# Patient Record
Sex: Female | Born: 1945 | Race: White | Hispanic: No | Smoking: Former smoker
Health system: Southern US, Community
[De-identification: ages and names within clinical notes are randomized; demographics above are authoritative.]

## PROBLEM LIST (undated history)

## (undated) DIAGNOSIS — H61009 Unspecified perichondritis of external ear, unspecified ear: Secondary | ICD-10-CM

## (undated) DIAGNOSIS — S7290XA Unspecified fracture of unspecified femur, initial encounter for closed fracture: Secondary | ICD-10-CM

## (undated) DIAGNOSIS — M8580 Other specified disorders of bone density and structure, unspecified site: Secondary | ICD-10-CM

## (undated) DIAGNOSIS — M1990 Unspecified osteoarthritis, unspecified site: Secondary | ICD-10-CM

## (undated) DIAGNOSIS — T8859XA Other complications of anesthesia, initial encounter: Secondary | ICD-10-CM

## (undated) DIAGNOSIS — K219 Gastro-esophageal reflux disease without esophagitis: Secondary | ICD-10-CM

## (undated) DIAGNOSIS — M25559 Pain in unspecified hip: Secondary | ICD-10-CM

## (undated) DIAGNOSIS — T4145XA Adverse effect of unspecified anesthetic, initial encounter: Secondary | ICD-10-CM

## (undated) DIAGNOSIS — D649 Anemia, unspecified: Secondary | ICD-10-CM

## (undated) DIAGNOSIS — M810 Age-related osteoporosis without current pathological fracture: Secondary | ICD-10-CM

## (undated) DIAGNOSIS — G4700 Insomnia, unspecified: Secondary | ICD-10-CM

## (undated) DIAGNOSIS — E079 Disorder of thyroid, unspecified: Secondary | ICD-10-CM

## (undated) HISTORY — PX: FOOT SURGERY: SHX648

## (undated) HISTORY — DX: Pain in unspecified hip: M25.559

## (undated) HISTORY — PX: ANKLE SURGERY: SHX546

## (undated) HISTORY — PX: HIP SURGERY: SHX245

## (undated) HISTORY — PX: TOTAL HIP ARTHROPLASTY: SHX124

## (undated) HISTORY — DX: Insomnia, unspecified: G47.00

## (undated) HISTORY — DX: Unspecified perichondritis of external ear, unspecified ear: H61.009

## (undated) HISTORY — PX: KNEE SURGERY: SHX244

## (undated) HISTORY — PX: LUMBAR SPINE SURGERY: SHX701

## (undated) HISTORY — PX: FRACTURE SURGERY: SHX138

## (undated) HISTORY — PX: LEG SURGERY: SHX1003

## (undated) HISTORY — DX: Unspecified fracture of unspecified femur, initial encounter for closed fracture: S72.90XA

## (undated) HISTORY — DX: Disorder of thyroid, unspecified: E07.9

---

## 2013-08-25 DIAGNOSIS — Z9282 Status post administration of tPA (rtPA) in a different facility within the last 24 hours prior to admission to current facility: Secondary | ICD-10-CM | POA: Insufficient documentation

## 2013-08-25 DIAGNOSIS — M810 Age-related osteoporosis without current pathological fracture: Secondary | ICD-10-CM | POA: Insufficient documentation

## 2013-08-25 DIAGNOSIS — Z79891 Long term (current) use of opiate analgesic: Secondary | ICD-10-CM | POA: Insufficient documentation

## 2014-06-10 DIAGNOSIS — Z79899 Other long term (current) drug therapy: Secondary | ICD-10-CM | POA: Insufficient documentation

## 2014-06-10 DIAGNOSIS — Z87891 Personal history of nicotine dependence: Secondary | ICD-10-CM | POA: Insufficient documentation

## 2014-06-10 DIAGNOSIS — Y9389 Activity, other specified: Secondary | ICD-10-CM | POA: Insufficient documentation

## 2014-06-10 DIAGNOSIS — M810 Age-related osteoporosis without current pathological fracture: Secondary | ICD-10-CM | POA: Diagnosis not present

## 2014-06-10 DIAGNOSIS — Z7982 Long term (current) use of aspirin: Secondary | ICD-10-CM | POA: Diagnosis not present

## 2014-06-10 DIAGNOSIS — IMO0002 Reserved for concepts with insufficient information to code with codable children: Secondary | ICD-10-CM | POA: Diagnosis not present

## 2014-06-10 DIAGNOSIS — Y9289 Other specified places as the place of occurrence of the external cause: Secondary | ICD-10-CM | POA: Diagnosis not present

## 2014-06-10 DIAGNOSIS — S46909A Unspecified injury of unspecified muscle, fascia and tendon at shoulder and upper arm level, unspecified arm, initial encounter: Secondary | ICD-10-CM | POA: Diagnosis present

## 2014-06-10 DIAGNOSIS — W07XXXA Fall from chair, initial encounter: Secondary | ICD-10-CM | POA: Insufficient documentation

## 2014-06-10 DIAGNOSIS — S42209A Unspecified fracture of upper end of unspecified humerus, initial encounter for closed fracture: Secondary | ICD-10-CM | POA: Diagnosis not present

## 2014-06-10 DIAGNOSIS — S4980XA Other specified injuries of shoulder and upper arm, unspecified arm, initial encounter: Secondary | ICD-10-CM | POA: Diagnosis present

## 2014-06-10 NOTE — ED Notes (Signed)
Per EMS: Pt was getting up from chair and fell, caught herself with L arm. Deformity and swelling noted to L shoulder, as well as bruising. Pt denies being on blood thinners. Good distal pulse. Pt given 200 mcg Fentanyl and 4 mg Zofran en route.

## 2014-06-11 ENCOUNTER — Emergency Department (HOSPITAL_COMMUNITY): Payer: Medicare Other

## 2014-06-11 ENCOUNTER — Emergency Department (HOSPITAL_COMMUNITY)
Admission: EM | Admit: 2014-06-11 | Discharge: 2014-06-11 | Disposition: A | Discharge status: Home / Self Care | Payer: Medicare Other | Attending: Emergency Medicine | Admitting: Emergency Medicine

## 2014-06-11 ENCOUNTER — Encounter (HOSPITAL_COMMUNITY): Payer: Self-pay | Admitting: Emergency Medicine

## 2014-06-11 DIAGNOSIS — S42302A Unspecified fracture of shaft of humerus, left arm, initial encounter for closed fracture: Secondary | ICD-10-CM

## 2014-06-11 HISTORY — DX: Age-related osteoporosis without current pathological fracture: M81.0

## 2014-06-11 HISTORY — DX: Other specified disorders of bone density and structure, unspecified site: M85.80

## 2014-06-11 MED ORDER — HYDROCODONE-ACETAMINOPHEN 5-325 MG PO TABS
1.0000 | ORAL_TABLET | Freq: Once | ORAL | Status: AC
  Administered 2014-06-11: 1 via ORAL
  Filled 2014-06-11: qty 1

## 2014-06-11 NOTE — ED Provider Notes (Signed)
CSN: [id number]     Arrival date & time 06/10/14  2358 History   First MD Initiated Contact with Patient 06/11/14 0004     Chief Complaint  Patient presents with  . Fall   HPI Patient with PMH of multiple fractures and dislocations of multiple joints and chronic pain and osteopenia presents after a fall at 8pm. She fell directly on anterior left shoulder which immediately became edematous. She has severe pain in left shoulder but denies pain anywhere else. She denies loss of consciousness or hitting her head. She says she lost her balance which happens often for her. She says she did get lightheaded which happens often for her. Also she has leg length discrepancy which she attributed to her fall as well. Patient denies CP, shortness of breath, palpitations, peripheral edema, N/V/D, numbness, tingling. She endorses chronic weakness in her legs bilaterally. Patient takes regularly takes norco 6 times a day for chronic pain and is seen at [facility]. She was given 200mcg Fentanyl and 4mg  zofran by EMS.  Past Medical History  Diagnosis Date  . Osteopenia   . Osteoporosis    Past Surgical History  Procedure Laterality Date  . Total hip arthroplasty    . Knee surgery    . Foot surgery     No family history on file. History  Substance Use Topics  . Smoking status: Former Games developermoker  . Smokeless tobacco: Not on file  . Alcohol Use: 4.8 oz/week    8 Glasses of wine per week   OB History   Grav Para Term Preterm Abortions TAB SAB Ect Mult Living                 Review of Systems  Constitutional: Negative for fever and chills.  HENT: Negative for congestion and rhinorrhea.   Respiratory: Negative for cough and shortness of breath.   Cardiovascular: Negative for chest pain, palpitations and leg swelling.  Gastrointestinal: Negative for nausea, vomiting and diarrhea.  Musculoskeletal: Negative for back pain and neck pain.  Neurological: Positive for weakness. Negative for syncope and  numbness.  Psychiatric/Behavioral: Negative for confusion.      Allergies  Butrans  Home Medications   Prior to Admission medications   Medication Sig Start Date End Date Taking? Authorizing Provider  aspirin EC 81 MG tablet Take 81 mg by mouth daily.   Yes Historical Provider, MD  cetirizine (ZYRTEC) 10 MG tablet Take 10 mg by mouth daily.   Yes Historical Provider, MD  fluticasone (FLONASE) 50 MCG/ACT nasal spray Place 2 sprays into both nostrils daily.   Yes Historical Provider, MD  HYDROcodone-acetaminophen (NORCO/VICODIN) 5-325 MG per tablet Take 2 tablets by mouth every 4 (four) hours as needed for moderate pain.   Yes Historical Provider, MD  raloxifene (EVISTA) 60 MG tablet Take 60 mg by mouth daily.   Yes Historical Provider, MD  terbinafine (LAMISIL) 250 MG tablet Take 250 mg by mouth daily.   Yes Historical Provider, MD  traZODone (DESYREL) 50 MG tablet Take 100 mg by mouth at bedtime.   Yes Historical Provider, MD   BP 97/48  Pulse 66  Temp(Src) 97.9 F (36.6 C) (Oral)  Resp 19  SpO2 98% Physical Exam  Vitals reviewed. Constitutional: She is oriented to person, place, and time. No distress.  HENT:  Head: Normocephalic and atraumatic.  Eyes: Conjunctivae and EOM are normal.  Neck: Normal range of motion. Neck supple.  Cardiovascular: Normal rate, regular rhythm and normal heart sounds.  Pulmonary/Chest: Effort normal and breath sounds normal. No respiratory distress. She has no wheezes.  Abdominal: Soft. Bowel sounds are normal. She exhibits no distension. There is no tenderness.  Musculoskeletal:       Left shoulder: She exhibits decreased range of motion, tenderness, deformity and pain. She exhibits normal pulse.  Left shoulder: 5cm hematoma with overlying 3-4cm noncontaminated abrasion and ecchymosis.  Sensation and ROM of L elbow intact with strength limited due to pain. Neurovascularly intact.  Neurological: She is alert and oriented to person, place, and  time. She exhibits normal muscle tone.  Skin: Skin is warm and dry. She is not diaphoretic.  Psychiatric: She has a normal mood and affect. Her behavior is normal.    ED Course  Procedures (including critical care time) Labs Review Labs Reviewed - No data to display  Imaging Review Dg Shoulder Left  06/11/2014   CLINICAL DATA:  Patient fell off a chair onto her left shoulder with pain and bruising  EXAM: LEFT SHOULDER - 2+ VIEW  COMPARISON:  None.  FINDINGS: There is a fracture of the surgical neck of the humerus, with moderate medial displacement of the distal fracture fragment and mild apex medial angulation.  IMPRESSION: Acute fracture of the surgical neck of the humerus   Electronically Signed   By: Esperanza Heir M.D.   On: 06/11/2014 01:27     EKG Interpretation   Date/Time:  Saturday June 11 2014 00:24:35 EDT Ventricular Rate:  65 PR Interval:  131 QRS Duration: 95 QT Interval:  471 QTC Calculation: 490 R Axis:   80 Text Interpretation:  Normal sinus rhythm Normal ECG No old tracing to  compare Confirmed by MILLER  MD, BRIAN ([id number]) on 06/11/2014 12:27:13 AM      MDM   Final diagnoses:  Left humeral fracture, closed, initial encounter   Patient with PMH of multiple fractures and dislocations of knee, shoulder and hip presents after mechanical fall with left shoulder hematoma and deformity. Xray shows left humeral fracture without dislocation. Patient given shoulder immobilizer and advised to follow up with Dr. Magnus Ivan, orthopedist. RICE: rest, ice, compression and elevation. Patient advised on the signs and symptoms of infection and verbalized understanding. Patient given pain meds in the ED but not an outpatient script. Patient advised to follow up with pain clinic for outpatient medical management .  Discussed return precautions with patient. Discussed all results and patient verbalizes understanding and agrees with plan.  This is a shared patient. This patient was  discussed with the physician who saw and evaluated the patient.    Louann Sjogren, PA-C 06/11/14 [phone]

## 2014-06-11 NOTE — ED Notes (Signed)
Bed: WA17 Expected date: 06/10/14 Expected time: 11:43 PM Means of arrival: Ambulance Comments: Bed 17, EMS, 3766 F, Fall

## 2014-06-11 NOTE — ED Provider Notes (Signed)
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 She presents after losing her balance this evening when trying to stand, fell over onto her left shoulder onto a carpeted surface causing acute onset of pain at the shoulder. Her medics were called to the scene and found the patient with a deformity and swelling of the left shoulder, they placed a splint in the field in the form of a sling, give 200 mcg of fentanyl with some improvement. The patient states is not unusual for her to get lightheaded or to lose her balance when she stands. She does use assistance when ambulating including scooters and walking devices  Medical screening examination/treatment/procedure(s) were conducted as a shared visit with non-physician practitioner(s) and myself.  I personally evaluated the patient during the encounter.  Clinical Impression: fracture of proximal humerus      Vida RollerBrian D Lavern Maslow, MD 06/11/14 [phone]

## 2014-06-11 NOTE — Discharge Instructions (Signed)
Return to the emergency room with worsening of symptoms or with symptoms that are concerning. Follow up with orthopedics, either the surgeon who has previously operated on you or the one in [facility]. Continue with home pain meds. Do not operate machinery or drive while taking narcotics. Contact your pain clinic provider for increases in pain meds. RICE: Rest, ICE, Compression, Elevation

## 2014-06-11 NOTE — ED Provider Notes (Signed)
Medical screening examination/treatment/procedure(s) were conducted as a shared visit with non-physician practitioner(s) and myself.  I personally evaluated the patient during the encounter  Please see my separate respective documentation pertaining to this patient encounter   Vida RollerBrian D Joziah Dollins, MD 06/11/14 [phone]

## 2014-06-11 NOTE — ED Notes (Signed)
MD at bedside. 

## 2014-09-03 LAB — CBC
HCT: 34.4 % — ABNORMAL LOW (ref 35.0–47.0)
HGB: 11.2 g/dL — ABNORMAL LOW (ref 12.0–16.0)
MCH: 33.3 pg (ref 26.0–34.0)
MCHC: 32.7 g/dL (ref 32.0–36.0)
MCV: 102 fL — ABNORMAL HIGH (ref 80–100)
PLATELETS: 306 10*3/uL (ref 150–440)
RBC: 3.37 10*6/uL — ABNORMAL LOW (ref 3.80–5.20)
RDW: 13 % (ref 11.5–14.5)
WBC: 13.9 10*3/uL — AB (ref 3.6–11.0)

## 2014-09-03 LAB — COMPREHENSIVE METABOLIC PANEL
ALBUMIN: 3.7 g/dL (ref 3.4–5.0)
AST: 29 U/L (ref 15–37)
Alkaline Phosphatase: 77 U/L
Anion Gap: 13 (ref 7–16)
BILIRUBIN TOTAL: 0.2 mg/dL (ref 0.2–1.0)
BUN: 6 mg/dL — AB (ref 7–18)
CO2: 23 mmol/L (ref 21–32)
CREATININE: 0.46 mg/dL — AB (ref 0.60–1.30)
Calcium, Total: 7.9 mg/dL — ABNORMAL LOW (ref 8.5–10.1)
Chloride: 93 mmol/L — ABNORMAL LOW (ref 98–107)
EGFR (Non-African Amer.): >60
GLUCOSE: 90 mg/dL (ref 65–99)
OSMOLALITY: 256 (ref 275–301)
Potassium: 3.8 mmol/L (ref 3.5–5.1)
SGPT (ALT): 19 U/L
Sodium: 129 mmol/L — ABNORMAL LOW (ref 136–145)
TOTAL PROTEIN: 6.7 g/dL (ref 6.4–8.2)

## 2014-09-03 LAB — TROPONIN I: Troponin-I: <0.02 ng/mL

## 2014-09-04 ENCOUNTER — Inpatient Hospital Stay: Payer: Self-pay | Admitting: Internal Medicine

## 2014-09-04 LAB — URINALYSIS, COMPLETE
BLOOD: NEGATIVE
Bilirubin,UR: NEGATIVE
Glucose,UR: NEGATIVE mg/dL (ref 0–75)
Leukocyte Esterase: NEGATIVE
Nitrite: NEGATIVE
PH: 6 (ref 4.5–8.0)
Protein: NEGATIVE
RBC,UR: NONE SEEN /HPF (ref 0–5)
Specific Gravity: 1.009 (ref 1.003–1.030)

## 2014-09-04 LAB — PROTIME-INR
INR: 0.9
Prothrombin Time: 12.5 secs (ref 11.5–14.7)

## 2014-09-04 LAB — APTT: Activated PTT: 29.6 secs (ref 23.6–35.9)

## 2014-09-05 LAB — CBC WITH DIFFERENTIAL/PLATELET
BASOS ABS: 0 10*3/uL (ref 0.0–0.1)
Basophil #: 0 10*3/uL (ref 0.0–0.1)
Basophil %: 0.2 %
Basophil %: 0.4 %
EOS PCT: 0 %
Eosinophil #: 0 10*3/uL (ref 0.0–0.7)
Eosinophil #: 0 10*3/uL (ref 0.0–0.7)
Eosinophil %: 0.3 %
HCT: 20.8 % — ABNORMAL LOW (ref 35.0–47.0)
HCT: 23.7 % — AB (ref 35.0–47.0)
HGB: 7 g/dL — ABNORMAL LOW (ref 12.0–16.0)
HGB: 8 g/dL — AB (ref 12.0–16.0)
Lymphocyte #: 0.5 10*3/uL — ABNORMAL LOW (ref 1.0–3.6)
Lymphocyte #: 1 10*3/uL (ref 1.0–3.6)
Lymphocyte %: 7.7 %
Lymphocyte %: 12.3 %
MCH: 34.3 pg — ABNORMAL HIGH (ref 26.0–34.0)
MCH: 32.2 pg (ref 26.0–34.0)
MCHC: 33.6 g/dL (ref 32.0–36.0)
MCHC: 33.9 g/dL (ref 32.0–36.0)
MCV: 102 fL — ABNORMAL HIGH (ref 80–100)
MCV: 95 fL (ref 80–100)
MONO ABS: 1 x10 3/mm — AB (ref 0.2–0.9)
Monocyte #: 1 x10 3/mm — ABNORMAL HIGH (ref 0.2–0.9)
Monocyte %: 15.8 %
Monocyte %: 12.9 %
NEUTROS ABS: 6 10*3/uL (ref 1.4–6.5)
NEUTROS PCT: 76.3 %
Neutrophil #: 5 10*3/uL (ref 1.4–6.5)
Neutrophil %: 74.1 %
Platelet: 215 10*3/uL (ref 150–440)
Platelet: 184 10*3/uL (ref 150–440)
RBC: 2.04 10*6/uL — AB (ref 3.80–5.20)
RBC: 2.5 10*6/uL — ABNORMAL LOW (ref 3.80–5.20)
RDW: 12.8 % (ref 11.5–14.5)
RDW: 17 % — AB (ref 11.5–14.5)
WBC: 6.5 10*3/uL (ref 3.6–11.0)
WBC: 8.1 10*3/uL (ref 3.6–11.0)

## 2014-09-05 LAB — BASIC METABOLIC PANEL
Anion Gap: 7 (ref 7–16)
BUN: 6 mg/dL — AB (ref 7–18)
CALCIUM: 7.9 mg/dL — AB (ref 8.5–10.1)
Chloride: 99 mmol/L (ref 98–107)
Co2: 25 mmol/L (ref 21–32)
Creatinine: 0.47 mg/dL — ABNORMAL LOW (ref 0.60–1.30)
EGFR (Non-African Amer.): >60
Glucose: 120 mg/dL — ABNORMAL HIGH (ref 65–99)
OSMOLALITY: 261 (ref 275–301)
Potassium: 4.6 mmol/L (ref 3.5–5.1)
Sodium: 131 mmol/L — ABNORMAL LOW (ref 136–145)

## 2014-09-06 LAB — HEMOGLOBIN: HGB: 8 g/dL — ABNORMAL LOW (ref 12.0–16.0)

## 2014-09-07 LAB — CBC WITH DIFFERENTIAL/PLATELET
BASOS ABS: 0 10*3/uL (ref 0.0–0.1)
BASOS PCT: 0.3 %
Eosinophil #: 0 10*3/uL (ref 0.0–0.7)
Eosinophil %: 0.5 %
HCT: 23.8 % — ABNORMAL LOW (ref 35.0–47.0)
HGB: 7.9 g/dL — ABNORMAL LOW (ref 12.0–16.0)
LYMPHS PCT: 11.3 %
Lymphocyte #: 1 10*3/uL (ref 1.0–3.6)
MCH: 32.2 pg (ref 26.0–34.0)
MCHC: 33.1 g/dL (ref 32.0–36.0)
MCV: 97 fL (ref 80–100)
Monocyte #: 0.9 x10 3/mm (ref 0.2–0.9)
Monocyte %: 9.8 %
NEUTROS PCT: 78.1 %
Neutrophil #: 6.8 10*3/uL — ABNORMAL HIGH (ref 1.4–6.5)
PLATELETS: 236 10*3/uL (ref 150–440)
RBC: 2.44 10*6/uL — ABNORMAL LOW (ref 3.80–5.20)
RDW: 16.8 % — ABNORMAL HIGH (ref 11.5–14.5)
WBC: 8.7 10*3/uL (ref 3.6–11.0)

## 2015-02-25 NOTE — Consult Note (Signed)
Brief Consult Note: Diagnosis: Left intertrochanteric hip fracture with acute vs. chronic left proximal humerus fracture status post fall.   Patient was seen by consultant.   Consult note dictated.   Recommend to proceed with surgery or procedure.   Orders entered.   Comments: Patient has a displaced left intertrochanteric hip fracture for which I have recommended intramedullary fixation tomorrow.  She will be admitted to hospitalist service and will require pre-op clearance.  Orders entered.  Patient placed on OR schedule for tomorrow.  NPO after midnight.  No anticoagulants overnight.  Electronic Signatures: Juanell FairlyKrasinski, Ionia Schey (MD)  (Signed [phone]-Nov-15 00:20)  Authored: Brief Consult Note   Last Updated: 01-Nov-15 00:20 by Juanell FairlyKrasinski, Bralon Antkowiak (MD)

## 2015-02-25 NOTE — Discharge Summary (Signed)
PATIENT NAME:  Nicole Butler, Nicole Butler MR#:  [id number] DATE OF BIRTH:  04-Aug-1946  DATE OF ADMISSION:  09/04/2014 DATE OF DISCHARGE:  09/07/2014  DISCHARGE DIAGNOSES:  1.  Left intertrochanteric femur fracture.  2.  Chronic humerus fracture.  3.  Chronic pain syndrome.  4.  Constipation.  5.  Noncompliance.   DISCHARGE MEDICATIONS:  1.  Evista 60 mg oral once a day.  2.  Terbinafin 250 mg oral once a day.  3.  Tizanidine 2 mg oral every 6 hours as needed.  4.  Trazodone 100 mg oral once a day.  5.  Acetaminophen hydrocodone 325/5 at 1 tablet oral every 6 hours as needed.  6.  Ferrous sulfate 325 mg oral 2 times a day.  7.  Docusate sodium diastolic murmur 240 mg oral once a day.  8.  Enoxaparin 30 mg subcutaneous once a day for 4 weeks.  9.  Calcium vitamin D 1 tablet 2 times a day.  10. Fentanyl 25 mcg topical every 3 days.   DISCHARGE INSTRUCTIONS: Physical therapy at home has been set up. Home health. Regular diet. Activity per Dr. Martha ClanKrasinski.  Follow up with Dr. Martha ClanKrasinski in 1 to 2 weeks.   IMAGING STUDIES DONE: Include a left hip x-ray showed left hip fracture.   ADMITTING HISTORY AND PHYSICAL:  Please see detailed H and P dictated previously. In brief, a 69 year old female patient with chronic pain syndrome, presented to the hospital with left hip pain after having a fall which was mechanical. The patient was found to have a left hip fracture, admitted to hospitalist service. Left intertrochanteric femur fracture. The patient had surgery with Dr. Martha ClanKrasinski and tolerated it well. She did have some acute blood loss anemia after surgery for which she had 2 units of packed RBC transfused. Hemoglobin was stable.  PT saw the patient.  The patient was recommended that she be discharged to a skilled nursing facility for further physical therapy, but the patient has refused and wanted to go home and is being discharged home with home health at the patient's request. The patient has been given fentanyl  patches for acute pain along with her prior home medications being refilled.   The patient has been noncompliant with physical therapy, also taking her laxatives. She does have chronic constipation, but no abdominal pain, nausea, vomiting, and had a bowel movement in the hospital.   Prior to discharge, the patient is awake, alert. Lungs sound clear. S1, S2 heard. Dressing over the scar with no discharge or swelling.   TIME SPENT ON DAY OF DISCHARGE IN DISCHARGE ACTIVITY:  35 minutes.     ____________________________ Molinda BailiffSrikar R. Amay Mijangos, MD srs:DT D: 09/08/2014 14:14:27 ET T: 09/08/2014 14:44:13 ET JOB#: [id number]  cc: Wardell HeathSrikar R. Braylin Formby, MD, <Dictator> Orie FishermanSRIKAR R Hani Campusano MD ELECTRONICALLY SIGNED 09/28/2014 8:55

## 2015-02-25 NOTE — Op Note (Signed)
PATIENT NAME:  Nicole NicolasSPEAR, Irasema MR#:  [id number] DATE OF BIRTH:  Sep 09, 1946  DATE OF PROCEDURE:  09/04/2014  PREOPERATIVE DIAGNOSIS: Left comminuted, displaced intertrochanteric hip fracture.   POSTOPERATIVE DIAGNOSIS: Left comminuted, displaced intertrochanteric hip fracture.   PROCEDURE: Intramedullary fixation of left intertrochanteric hip fracture.   SURGEON: Kathreen DevoidKevin L. Ronte Parker, MD   ANESTHESIA: Spinal with MAC.   ESTIMATED BLOOD LOSS: 100 mL.   COMPLICATIONS: None.   IMPLANTS: Biomet Affixus 9 mm short nail with 100 mm lag screw and 36 mm distal interlocking screw.   INDICATIONS FOR PROCEDURE: The patient is a 69 year old female who sustained a fall prior to arrival at [facility]. She had a previous proximal humerus fracture on the left side sustained approximately 3 weeks prior. The patient was diagnosed with a comminuted displaced left intertrochanteric hip fracture. Given the patient's ambulatory status at baseline, I  recommended intramedullary fixation for this fracture. I reviewed the risks and benefits of surgery with the patient. She understands the risks include, but are not limited to, infection, bleeding, nerve or blood vessel injury, persistent hip pain, leg length discrepancy or change in lower extremity rotation, and the need for further surgery, femoral head AVN, and failure of the hardware. Medical risks include, but are not limited to, DVT and pulmonary embolism, myocardial infarction, stroke, pneumonia, respiratory failure, and death. The patient understood these risks and wished to proceed.   PROCEDURE NOTE: The patient was brought into the operating room after her left hip was marked according to the hospital's right site protocol. She underwent a spinal anesthetic by the anesthesia service. She was then placed supine on the fracture table. Her left leg was placed in a leg holder and the right leg was placed in a well leg holder in a hemi-lithotomy position. All bony prominences  were adequately padded. The patient was prepped and draped in a sterile fashion. A timeout was performed to verify the patient's name, date of birth, medical record number, correct site of surgery, and correct procedure to be performed. It was also used to verify the patient received antibiotics and that all appropriate instruments, implants, and radiographic studies were available in the room. Once all in attendance were in agreement, the case began.   The patient had traction and slight external rotation applied to align the fracture in the near anatomic position. An incision was made then superior to the tip of the greater trochanter. A threaded guide pin was then inserted into the tip of the greater trochanter and advanced into the proximal femur to the level of the lesser trochanter. This was overdrilled with a 15 mm entry drill. The patient then had a Biomet Affixus, 9 mm short nail, inserted into the tip of the greater trochanter and advanced across the fracture and into the proximal femoral shaft. Its position was confirmed on AP and lateral C-arm images. A drill sleeve was then placed through the Biomet Affixus guide arm. A stab incision was made over the lateral hip near the greater trochanter. The guide sleeve was then brought in contact with the lateral cortex of the femur. A threaded drill pin was then advanced through the lateral cortex across the fracture site and into the femoral head. Its position was confirmed on AP and lateral C-arm images. The depth was measured at 100 mm. This was then overdrilled with a lag screw and drill. A 100 mm lag screw was then advanced into position by hand. Again, its position was confirmed on AP and lateral C-arm  images. The set screw was then tightened and then loosened a quarter turn to allow for compression at the fracture site. The attention was then turned to the distal interlocking screw.   Through the guide arm again, a drill sleeve was placed. Another stab  incision distal to the lag screw incision was made. The interlocking drill guide was then advanced into position along the lateral cortex of the femur. The drill guide for the distal interlocking screw was then advanced bicortically. A depth gauge was used to measure the diameter of the cortex at this level. It was found to be 36 mm in length. The distal interlocking screw was then advanced into position by hand. The Affixus guide arm was then removed. Final C-arm images of the intramedullary construct was performed. All incisions were then copiously irrigated. Through the proximal incision, the fascia lata was closed with an interrupted 0 Vicryl. All 3 incisions had the subcutaneous tissue closed with 2-0 Vicryl and the skin approximated with staples. A dry sterile dressing was applied. The patient was then transferred to a hospital bed and brought to the PACU in stable condition. I was scrubbed and present for the entire case, and all sharp and instrument counts were correct at the conclusion of the case.   I spoke with the patient's family postoperatively to let them know the case had gone without complication and the patient was stable in the recovery room.    ____________________________ Kathreen Devoid, MD klk:LT D: 09/12/2014 19:21:20 ET T: 09/12/2014 19:43:43 ET JOB#: [id number]  cc: Kathreen Devoid, MD, <Dictator> Kathreen Devoid MD ELECTRONICALLY SIGNED 09/21/2014 8:06

## 2015-02-25 NOTE — H&P (Signed)
PATIENT NAME:  Nicole Butler, SERGENT MR#:  [id number] DATE OF BIRTH:  04/30/1946  DATE OF ADMISSION:  09/04/2014  REFERRING DOCTOR: Dorothea Glassman, MD  PRIMARY CARE DOCTOR: Phillips Odor. Jamesetta Orleans, NP  ORTHOPEDIC SURGEON: Kathreen Devoid, MD  ADMITTING DOCTOR: Crissie Figures, MD  CHIEF COMPLAINT: Status post fall with left hip intertrochanteric fracture.  HISTORY OF PRESENT ILLNESS: A 69 year old Caucasian female with a past medical history of osteopenia, history of mild depression, presents to the Emergency Room with the complaint of status post fall with injury to left hip and left shoulder. The patient stated that at about 6:00 p.m. last night she was trying to just move around, following which she tripped and fell down on the left hip and left shoulder, following which she developed pain and she was not able to get up off the floor, and hence called for help and was brought to the Emergency Room for further evaluation. The patient denies any chest pain, shortness of breath, dizziness, loss of consciousness prior to the episode. She does have a history of frequent falls secondary due to her chronic orthopedic problems involving the right hip for which she has been under care of [facility] orthopedic surgeon. In the Emergency Room, the patient was evaluated by the ED physician and found to be with stable vitals, and x-rays revealed a left hip intertrochanteric fracture and also a left proximal humerus fracture, which is a kind of acute versus subacute. Orthopedic service was consulted by the ED physician and evaluated by the orthopedic surgeon, and the patient is scheduled for surgery for the left hip fracture in the morning. The patient got pain medications in the Emergency Room and currently her pain is under control. As mentioned earlier, the patient had a history of frequent falls and she did have multiple orthopedic surgeries involving the right hip including the right hip replacement and also in other bones,  following which her gait is unsteady and she uses a scooter at home, but she is ambulatory at home. No history of any hypertension, diabetes, heart problems, lung problems, kidney problems.   PAST MEDICAL HISTORY:  1.  Osteopenia.  2.  Mild depression.   PAST SURGICAL HISTORY: 1.  Multiple surgeries to right hip x 4 following injury and fractures.  2.  Status post right hip replacement.  3.  Multiple orthopedic surgeries involving other bones secondary due to fall and injuries.   HOME MEDICATIONS: 1.  Acetaminophen and hydrocodone 325/5 mg tablet 1 to 2 tablets every 6 hours as needed for pain.  2.  Evista 60 mg tablet 1 tablet orally once a day.  3.  Tizanidine 2 mg tablet 1 tablet every 6 hours as needed for spasms.  4.  Trazodone 100 mg tablet 1 tablet at bedtime for insomnia.  ALLERGIES: No known drug allergies.   SOCIAL HISTORY: She is single, lives alone at her home. She is a retired Media planner. A history of smoking in the remote past, quit many years ago. She does take infrequent alcohol, about 2 glasses of wine on and off. Denies any substance use.   FAMILY HISTORY: Nil significant.   REVIEW OF SYSTEMS:  CONSTITUTIONAL: Negative for fever, fatigue, generalized weakness, abdominal weight gain or weight loss.  EYES: Negative for blurred vision or double vision. No pain. No redness. No inflammation.  EARS, NOSE, AND THROAT: Negative for tinnitus, ear pain, hearing loss, epistaxis, nasal discharge, or difficulty swallowing.  RESPIRATORY: Negative for cough, wheezing, hemoptysis, dyspnea, or painful respirations.  CARDIOVASCULAR: Negative for chest pain, orthopnea, dyspnea on exertion, palpitations, syncope, or dizziness. No pedal edema.  GASTROINTESTINAL: Negative for nausea, vomiting, diarrhea, abdominal pain, hematemesis, melena, GERD symptoms, or rectal bleeding.  GENITOURINARY: Negative for dysuria, hematuria, frequency, or urgency.  ENDOCRINE: Negative for polyuria,  polydipsia. No heat or cold intolerance.  HEMATOLOGIC: Negative for anemia or easy bruising or bleeding.  INTEGUMENTARY: Negative for skin rash, acne, or skin lesions.  MUSCULOSKELETAL: History of multiple orthopedic surgeries on the right hip as well as other bones in the body secondary due to falls with fractures, and she does have chronic aches and pains for which she takes pain medication, hence she does have chronic spasms for which she takes pain medication. She is under care of orthopedics at [facility].  NEUROLOGICAL: Negative for focal weakness, numbness. No CVA, TIA, seizure disorder.  PSYCHIATRIC: She does have a history of mild depression and chronic insomnia, for which she takes medication and is currently under control.   PHYSICAL EXAMINATION:  VITAL SIGNS: Temperature 98.1 degrees Fahrenheit, pulse rate 75 per minute and regular, respirations 21 per minute, blood pressure 123/101, oxygen saturation 99% on room air.  GENERAL: Well-developed, well-nourished, alert, awake, and oriented, in mild distress. States pain under control with pain medications.  HEAD: Atraumatic, normocephalic.  EYES: Pupils equal and react to light. No conjunctival pallor. No scleral icterus. Extraocular movements intact.  NOSE: No nasal lesions. No drainage.  EARS: No drainage. No external lesions.  ORAL CAVITY: No mucosal lesions. No exudates. No masses.  NECK: Supple. No JVD. No thyromegaly. No carotid bruits. Range of motion normal. RESPIRATORY: Good respiratory effort. Not using any accessory muscles of respiration. Bilateral vesicular breath sounds. No rales or rhonchi.  HEART: S1, S2 regular. No murmurs, gallops, or clicks appreciated. No pedal edema. Peripheral pulses equal at carotid, femoral, and pedal pulses.  GASTROINTESTINAL: Abdomen soft, nontender. Liver and spleen not palpable. Bowel sounds present and equal in all 4 quadrants. No tenderness. No rigidity. No guarding.  GENITOURINARY: Deferred.   MUSCULOSKELETAL: Left lower extremity external rotation with shortening length compared to right leg. Not able to move because of the pain because of the fall and fracture. Right lower extremity able to move. Left upper extremity limited range of motion because of the shoulder pain secondary due to recent fracture. Right upper extremity able to move without any problems.  SKIN: Inspection within normal limits.  LYMPHATIC: No cervical lymphadenopathy.  VASCULAR: Good dorsalis pedis and posterior tibial pulses.  NEUROLOGICAL: Alert, awake, and oriented x 3. Cranial nerves II through XII grossly intact. No focal neurological, sensory, or motor deficit. Power 5/5 and equal in both extremities.  PSYCHIATRIC: Judgment and insight are adequate. Alert and oriented x 3. Memory and mood within normal limits.   LABORATORY DATA: Serum glucose 90, BUN 6, creatinine 0.46, serum sodium 129, potassium 3.8, chloride 93, bicarbonate 23, total calcium 7.9, total protein 6.7, total albumin 3.7, total bilirubin 0.2, alkaline phosphatase 77, AST 29, ALT 19. Troponin less than 0.02. WBC 13.9, hemoglobin 11.2, hematocrit 34.4, platelet count 306,000. Prothrombin time 12.1, INR 0.9, PTT 29.6.   IMAGING STUDIES: Report not available for my review at this time, but as per the orthopedic and the ER note, noted to have left hip intertrochanteric fracture which is acute and the left shoulder a proximal humerus fracture, acute versus chronic.   EKG: Obtained. Sinus rhythm with ventricular rate of 71 beats per minute. No acute ST-T changes.   ASSESSMENT AND PLAN: A 69 year old  Caucasian female with a past medical history significant for osteopenia, history of frequent falls, and history of multiple orthopedic surgeries in the past, presents with the complaints of a fall which is mechanical secondary due to tripping, workup revealed a fracture, left hip intertrochanteric fracture and left humerus proximal fracture, acute versus  chronic.  1.  Left hip intertrochanteric fracture secondary due to mechanical fall following tripping. Evaluated by the orthopedic surgeon and planned for surgery in the morning. Plan: Admit to medicine. Bed rest. Pain control measures. 2.  Fractured left proximal humerus, acute versus chronic, seen by orthopedics. Plan: Pain control measures. Further care per orthopedics' recommendations.  3.  History of osteopenia on Evista. Continue same.  4.  History of mild depression, stable on home medications. Continue same.  5.  History of insomnia on trazodone. Continue same.  6.  Deep vein thrombosis prophylaxis. TED stockings. No anticoagulants because of contemplated surgery in the morning.  The patient does not have any significant medical problems and is moderate risk to undergo surgery.   CODE STATUS: Full code.   TIME SPENT: 55 minutes.   ____________________________ Crissie FiguresEdavally N. Orlando Devereux, MD enr:ST D: 09/04/2014 01:28:26 ET T: 09/04/2014 01:20:13 ET JOB#: [id number]  cc: Crissie FiguresEdavally N. Murray Durrell, MD, <Dictator> Phillips Odorheryl A. Jamesetta OrleansWicker, NP Crissie FiguresEDAVALLY N Marcell Chavarin MD ELECTRONICALLY SIGNED 09/04/2014 18:39

## 2015-04-05 DIAGNOSIS — S32000A Wedge compression fracture of unspecified lumbar vertebra, initial encounter for closed fracture: Secondary | ICD-10-CM | POA: Insufficient documentation

## 2015-04-05 DIAGNOSIS — S72002A Fracture of unspecified part of neck of left femur, initial encounter for closed fracture: Secondary | ICD-10-CM | POA: Diagnosis present

## 2015-07-11 ENCOUNTER — Other Ambulatory Visit: Payer: Self-pay

## 2015-07-11 MED ORDER — TRAZODONE HCL 50 MG PO TABS: 50.0000 mg | ORAL_TABLET | Freq: Every day | ORAL | Status: DC

## 2015-07-11 NOTE — Telephone Encounter (Signed)
Patient was last seen 01/10/15, practice partner number is [phone] and pharmacy is [facility] in [place].

## 2015-08-16 LAB — HM DEXA SCAN

## 2015-08-29 DIAGNOSIS — E559 Vitamin D deficiency, unspecified: Secondary | ICD-10-CM

## 2015-08-29 DIAGNOSIS — F32A Depression, unspecified: Secondary | ICD-10-CM | POA: Insufficient documentation

## 2015-08-29 DIAGNOSIS — G894 Chronic pain syndrome: Secondary | ICD-10-CM | POA: Insufficient documentation

## 2015-08-29 DIAGNOSIS — S72143A Displaced intertrochanteric fracture of unspecified femur, initial encounter for closed fracture: Secondary | ICD-10-CM | POA: Insufficient documentation

## 2015-08-29 DIAGNOSIS — M810 Age-related osteoporosis without current pathological fracture: Secondary | ICD-10-CM

## 2015-08-29 DIAGNOSIS — F341 Dysthymic disorder: Secondary | ICD-10-CM | POA: Insufficient documentation

## 2015-08-29 DIAGNOSIS — F419 Anxiety disorder, unspecified: Secondary | ICD-10-CM

## 2015-08-29 DIAGNOSIS — G4700 Insomnia, unspecified: Secondary | ICD-10-CM | POA: Insufficient documentation

## 2015-08-29 DIAGNOSIS — E871 Hypo-osmolality and hyponatremia: Secondary | ICD-10-CM | POA: Insufficient documentation

## 2015-08-29 DIAGNOSIS — M1990 Unspecified osteoarthritis, unspecified site: Secondary | ICD-10-CM

## 2015-08-29 DIAGNOSIS — R7301 Impaired fasting glucose: Secondary | ICD-10-CM | POA: Insufficient documentation

## 2015-08-29 DIAGNOSIS — R110 Nausea: Secondary | ICD-10-CM | POA: Insufficient documentation

## 2015-08-29 DIAGNOSIS — F329 Major depressive disorder, single episode, unspecified: Secondary | ICD-10-CM | POA: Insufficient documentation

## 2015-08-29 DIAGNOSIS — D7589 Other specified diseases of blood and blood-forming organs: Secondary | ICD-10-CM | POA: Insufficient documentation

## 2015-08-29 DIAGNOSIS — F39 Unspecified mood [affective] disorder: Secondary | ICD-10-CM | POA: Insufficient documentation

## 2015-08-29 DIAGNOSIS — S72142D Displaced intertrochanteric fracture of left femur, subsequent encounter for closed fracture with routine healing: Secondary | ICD-10-CM

## 2015-09-05 ENCOUNTER — Encounter: Payer: Self-pay | Admitting: Unknown Physician Specialty

## 2015-09-05 ENCOUNTER — Ambulatory Visit: Payer: Self-pay | Admitting: Unknown Physician Specialty

## 2015-09-05 ENCOUNTER — Telehealth: Payer: Self-pay | Admitting: Unknown Physician Specialty

## 2015-09-05 NOTE — Telephone Encounter (Signed)
Routing to provider. I will look over medication list and update chart accordingly.

## 2015-09-05 NOTE — Telephone Encounter (Signed)
Spoke to New RoadsStephanie about the medication list she stated she was given by the patient. Judeth CornfieldStephanie stated that the patient said she was going to bring the list back later this week.

## 2015-09-05 NOTE — Telephone Encounter (Signed)
Called and let patient know letter was written and she could pick it up at the front desk.

## 2015-09-05 NOTE — Telephone Encounter (Signed)
This letter is printed up front

## 2015-09-05 NOTE — Telephone Encounter (Signed)
Pt came by dropped off a list of medications for Nicole MaxwellCheryl to review. Pt also stated she needs a letter to get out of [employer], pt needs letter as soon as possible. Thanks.

## 2015-09-07 ENCOUNTER — Other Ambulatory Visit: Payer: Self-pay

## 2015-09-07 NOTE — Telephone Encounter (Signed)
LAST VISIT: 01/10/2015 UPCOMING APPT: 09/11/2015 (patient had an appointment 09/05/15 but canceled) PRACTICE PARTNER: [phone]  Request for ondansetron (zofran) 4 mg tab.

## 2015-09-08 MED ORDER — ONDANSETRON HCL 4 MG PO TABS: 4.0000 mg | ORAL_TABLET | Freq: Every day | ORAL | Status: DC | PRN

## 2015-09-11 ENCOUNTER — Ambulatory Visit: Payer: Self-pay | Admitting: Unknown Physician Specialty

## 2015-10-02 ENCOUNTER — Encounter: Payer: Self-pay | Admitting: Unknown Physician Specialty

## 2015-10-02 ENCOUNTER — Ambulatory Visit (INDEPENDENT_AMBULATORY_CARE_PROVIDER_SITE_OTHER): Payer: Medicare Other | Admitting: Unknown Physician Specialty

## 2015-10-02 VITALS — BP 162/95 | HR 91 | Temp 98.6°F

## 2015-10-02 DIAGNOSIS — M810 Age-related osteoporosis without current pathological fracture: Secondary | ICD-10-CM

## 2015-10-02 DIAGNOSIS — I10 Essential (primary) hypertension: Secondary | ICD-10-CM

## 2015-10-02 DIAGNOSIS — S72142D Displaced intertrochanteric fracture of left femur, subsequent encounter for closed fracture with routine healing: Secondary | ICD-10-CM | POA: Diagnosis not present

## 2015-10-02 DIAGNOSIS — E871 Hypo-osmolality and hyponatremia: Secondary | ICD-10-CM

## 2015-10-02 MED ORDER — ONDANSETRON HCL 4 MG PO TABS: 4.0000 mg | ORAL_TABLET | Freq: Every day | ORAL | Status: DC | PRN

## 2015-10-02 MED ORDER — RALOXIFENE HCL 60 MG PO TABS: 60.0000 mg | ORAL_TABLET | Freq: Every day | ORAL | Status: DC

## 2015-10-02 NOTE — Assessment & Plan Note (Signed)
Per endocrine  

## 2015-10-02 NOTE — Assessment & Plan Note (Signed)
stable °

## 2015-10-02 NOTE — Assessment & Plan Note (Signed)
Larey SeatFell and will contact Orthopedist

## 2015-10-02 NOTE — Progress Notes (Signed)
BP 162/95 mmHg  Pulse 91  Temp(Src) 98.6 F (37 C)  Ht   Wt   SpO2 98%   Subjective:    Patient ID: Nicole Butler, female    DOB: 20-May-1946, 69 y.o.   MRN: [id number]  HPI: Nicole Butler is a 69 y.o. female  Chief Complaint  Patient presents with  . Medication Refill    pt states she needs refill on odansetron and raloxifene   "Don't shoot me my BP is high" as she came in with a cane and typically uses a scooter."  She is "wobbly" on a cane.  She is recovering well. No dizzyness, headache or edema.       Osteoporosis   She did get evaluated by Endocrine.  I have reviewed those notes.  Her hyponatremia is also being considered by them though I note it is not as severe in the last labs she has gotten.    Relevant past medical, surgical, family and social history reviewed and updated as indicated. Interim medical history since our last visit reviewed. Allergies and medications reviewed and updated.  Review of Systems  Per HPI unless specifically indicated above     Objective:    BP 162/95 mmHg  Pulse 91  Temp(Src) 98.6 F (37 C)  Ht   Wt   SpO2 98%  Wt Readings from Last 3 Encounters:  No data found for Wt    Physical Exam  Constitutional: She is oriented to person, place, and time. She appears well-developed and well-nourished. No distress.  HENT:  Head: Normocephalic and atraumatic.  Eyes: Conjunctivae and lids are normal. Right eye exhibits no discharge. Left eye exhibits no discharge. No scleral icterus.  Cardiovascular: Normal rate and normal heart sounds.   Pulmonary/Chest: Effort normal and breath sounds normal.  Abdominal: Normal appearance. There is no splenomegaly or hepatomegaly.  Musculoskeletal: Normal range of motion.  Walks with a cane  Neurological: She is alert and oriented to person, place, and time.  Skin: Skin is intact. No rash noted. No pallor.  Psychiatric: She has a normal mood and affect. Her behavior is normal. Judgment and thought  content normal.    Results for orders placed or performed in visit on 09/04/14  CBC  Result Value Ref Range   WBC 13.9 (H) 3.6-11.0 x10 3/mm 3   RBC 3.37 (L) 3.80-5.20 X10 6/mm 3   HGB 11.2 (L) 12.0-16.0 g/dL   HCT 34.4 (L) 35.0-47.0 %   MCV 102 (H) 80-100 fL   MCH 33.3 26.0-34.0 pg   MCHC 32.7 32.0-36.0 g/dL   RDW 13.0 11.5-14.5 %   Platelet 306 150-440 x10 3/mm 3  Comprehensive metabolic panel  Result Value Ref Range   Glucose 90 65-99 mg/dL   BUN 6 (L) 7-18 mg/dL   Creatinine 0.46 (L) 0.60-1.30 mg/dL   Sodium 129 (L) 136-145 mmol/L   Potassium 3.8 3.5-5.1 mmol/L   Chloride 93 (L) 98-107 mmol/L   Co2 23 21-32 mmol/L   Calcium, Total 7.9 (L) 8.5-10.1 mg/dL   SGOT(AST) 29 15-37 Unit/L   SGPT (ALT) 19 U/L   Alkaline Phosphatase 77 Unit/L   Albumin 3.7 3.4-5.0 g/dL   Total Protein 6.7 6.4-8.2 g/dL   Bilirubin,Total 0.2 0.2-1.0 mg/dL   Osmolality 256 275-301   Anion Gap 13 7-16   EGFR (African American) >60 >52m/min   EGFR (Non-African Amer.) >60 >646mmin  Troponin I  Result Value Ref Range   Troponin-I < 0.02 ng/mL  Protime-INR  Result Value Ref  Range   Prothrombin Time 12.5 11.5-14.7 secs   INR 0.9   APTT  Result Value Ref Range   Activated PTT 29.6 23.6-35.9 secs  Urinalysis, Complete  Result Value Ref Range   Color - urine Yellow    Clarity - urine Hazy    Glucose,UR Negative 0-75 mg/dL   Bilirubin,UR Negative NEGATIVE   Ketone Trace NEGATIVE   Specific Gravity 1.009 1.003-1.030   Blood Negative NEGATIVE   Ph 6.0 4.5-8.0   Protein Negative NEGATIVE   Nitrite Negative NEGATIVE   Leukocyte Esterase Negative NEGATIVE   RBC,UR NONE SEEN 0-5 /HPF   WBC UR 2 /HPF 0-5 /HPF   Bacteria TRACE NONE SEEN   Squamous Epithelial <1 /HPF    Hyaline Cast 2 /LPF   CBC with Differential/Platelet  Result Value Ref Range   WBC 6.5 3.6-11.0 x10 3/mm 3   RBC 2.04 (L) 3.80-5.20 X10 6/mm 3   HGB 7.0 (L) 12.0-16.0 g/dL   HCT 20.8 (L) 35.0-47.0 %   MCV 102 (H) 80-100 fL    MCH 34.3 (H) 26.0-34.0 pg   MCHC 33.6 32.0-36.0 g/dL   RDW 12.8 11.5-14.5 %   Platelet 215 150-440 x10 3/mm 3   Neutrophil % 76.3 %   Lymphocyte % 7.7 %   Monocyte % 15.8 %   Eosinophil % 0.0 %   Basophil % 0.2 %   Neutrophil # 5.0 1.4-6.5 x10 3/mm 3   Lymphocyte # 0.5 (L) 1.0-3.6 x10 3/mm 3   Monocyte # 1.0 (H) 0.2-0.9 x10 3/mm    Eosinophil # 0.0 0.0-0.7 x10 3/mm 3   Basophil # 0.0 0.0-0.1 x10 3/mm 3  Basic metabolic panel  Result Value Ref Range   Glucose 120 (H) 65-99 mg/dL   BUN 6 (L) 7-18 mg/dL   Creatinine 0.47 (L) 0.60-1.30 mg/dL   Sodium 131 (L) 136-145 mmol/L   Potassium 4.6 3.5-5.1 mmol/L   Chloride 99 98-107 mmol/L   Co2 25 21-32 mmol/L   Calcium, Total 7.9 (L) 8.5-10.1 mg/dL   Osmolality 261 275-301   Anion Gap 7 7-16   EGFR (African American) >60 >37m/min   EGFR (Non-African Amer.) >60 >624mmin  CBC with Differential/Platelet  Result Value Ref Range   WBC 8.1 3.6-11.0 x10 3/mm 3   RBC 2.50 (L) 3.80-5.20 X10 6/mm 3   HGB 8.0 (L) 12.0-16.0 g/dL   HCT 23.7 (L) 35.0-47.0 %   MCV 95 80-100 fL   MCH 32.2 26.0-34.0 pg   MCHC 33.9 32.0-36.0 g/dL   RDW 17.0 (H) 11.5-14.5 %   Platelet 184 150-440 x10 3/mm 3   Neutrophil % 74.1 %   Lymphocyte % 12.3 %   Monocyte % 12.9 %   Eosinophil % 0.3 %   Basophil % 0.4 %   Neutrophil # 6.0 1.4-6.5 x10 3/mm 3   Lymphocyte # 1.0 1.0-3.6 x10 3/mm 3   Monocyte # 1.0 (H) 0.2-0.9 x10 3/mm    Eosinophil # 0.0 0.0-0.7 x10 3/mm 3   Basophil # 0.0 0.0-0.1 x10 3/mm 3  Hemoglobin  Result Value Ref Range   HGB 8.0 (L) 12.0-16.0 g/dL  CBC with Differential/Platelet  Result Value Ref Range   WBC 8.7 3.6-11.0 x10 3/mm 3   RBC 2.44 (L) 3.80-5.20 X10 6/mm 3   HGB 7.9 (L) 12.0-16.0 g/dL   HCT 23.8 (L) 35.0-47.0 %   MCV 97 80-100 fL   MCH 32.2 26.0-34.0 pg   MCHC 33.1 32.0-36.0 g/dL   RDW 16.8 (H) 11.5-14.5 %  Platelet 236 150-440 x10 3/mm 3   Neutrophil % 78.1 %   Lymphocyte % 11.3 %   Monocyte % 9.8 %   Eosinophil % 0.5  %   Basophil % 0.3 %   Neutrophil # 6.8 (H) 1.4-6.5 x10 3/mm 3   Lymphocyte # 1.0 1.0-3.6 x10 3/mm 3   Monocyte # 0.9 0.2-0.9 x10 3/mm    Eosinophil # 0.0 0.0-0.7 x10 3/mm 3   Basophil # 0.0 0.0-0.1 x10 3/mm 3      Assessment & Plan:   Problem List Items Addressed This Visit      Unprioritized   Closed displaced intertrochanteric fracture of left femur with routine healing    Fell and will contact Orthopedist      Hyponatremia    stable      Osteoporosis - Primary    Per endocrine      Relevant Medications   raloxifene (EVISTA) 60 MG tablet    Other Visit Diagnoses    Essential hypertension        High today and ? if it's related to not having scooter.  Will check at home and let me know if it's still elevated.           Follow up plan: Return in about 6 months (around 03/31/2016).       

## 2015-11-01 ENCOUNTER — Other Ambulatory Visit: Payer: Self-pay | Admitting: Unknown Physician Specialty

## 2015-12-14 ENCOUNTER — Other Ambulatory Visit: Payer: Self-pay | Admitting: Orthopedic Surgery

## 2015-12-15 ENCOUNTER — Encounter: Payer: Self-pay | Admitting: *Deleted

## 2015-12-15 ENCOUNTER — Other Ambulatory Visit: Payer: Self-pay

## 2015-12-15 NOTE — Patient Instructions (Signed)
  Your procedure is scheduled on: 12-21-15 Report to [facility] 2ND FLOOR To find out your arrival time please call [phone] between 1PM - 3PM on 12-20-15  Remember: Instructions that are not followed completely may result in serious medical risk, up to and including death, or upon the discretion of your surgeon and anesthesiologist your surgery may need to be rescheduled.    _X___ 1. Do not eat food or drink liquids after midnight. No gum chewing or hard candies.     _X___ 2. No Alcohol for 24 hours before or after surgery.   ____ 3. Bring all medications with you on the day of surgery if instructed.    ____ 4. Notify your doctor if there is any change in your medical condition     (cold, fever, infections).     Do not wear jewelry, make-up, hairpins, clips or nail polish.  Do not wear lotions, powders, or perfumes. You may wear deodorant.  Do not shave 48 hours prior to surgery. Men may shave face and neck.  Do not bring valuables to the hospital.    [facility] is not responsible for any belongings or valuables.               Contacts, dentures or bridgework may not be worn into surgery.  Leave your suitcase in the car. After surgery it may be brought to your room.  For patients admitted to the hospital, discharge time is determined by your  treatment team.   Patients discharged the day of surgery will not be allowed to drive home.   Please read over the following fact sheets that you were given:      ____ Take these medicines the morning of surgery with A SIP OF WATER:    1. NONE  2.   3.   4.  5.  6.  ____ Fleet Enema (as directed)   ____ Use CHG Soap as directed  ____ Use inhalers on the day of surgery  ____ Stop metformin 2 days prior to surgery    ____ Take 1/2 of usual insulin dose the night before surgery and none on the morning of surgery.   ____ Stop Coumadin/Plavix/aspirin-N/A  ____ Stop Anti-inflammatories-NO NSAIDS OR ASA  PRODUCTS-PERCOCET OK TO TAKE   ____ Stop supplements until after surgery.    ____ Bring C-Pap to the hospital.

## 2015-12-15 NOTE — Pre-Procedure Instructions (Signed)
CALLED SHERRY AT [facility] AND INFORMED HER THAT I HAD JUST FINISHED PTS PHONE INTERVIEW AND THAT PT SAID SHE WAS NOT COMING IN FOR DR Justine Null BLOOD WORK THAT HE WANTED PREOP AND I ALSO TOLD PT THAT SHE NEEDED AN EKG DONE AND SHE SAID SHE WAS NOT HAVING AN EKG DONE BECAUSE SHE HAS NO HEART ISSUES. I EXPLAINED TO HER THAT WAS ANESTHESIAS REQUIREMENT AND SHE SAID  SHE DID NOT CARE AND THAT SHE WAS NOT GOING TO HAVE IT DONE. I ALSO WENT OVER HER SURGERY INSTRUCTIONS ABOUT NPO AFTER MN AND SHE SAID SHE WAS GOING TO DRINK A SIP OF COFFEE THAT MORNING. I AGAIN REINFORCED THAT SHE WAS NOT TO EAT OR DRINK ANYTHING OR THEY MAY CANCEL HER AND THEN SHE SAID SHE WOULD LIE TO ANESTHESIA WHEN ASKED IF SHE HAD ANYTHING TO DRINK.  SHERRY TO INFORM DR K OF THIS

## 2015-12-21 ENCOUNTER — Ambulatory Visit: Payer: Medicare Other

## 2015-12-21 ENCOUNTER — Ambulatory Visit: Payer: Medicare Other | Admitting: Certified Registered"

## 2015-12-21 ENCOUNTER — Encounter: Payer: Self-pay | Admitting: *Deleted

## 2015-12-21 ENCOUNTER — Ambulatory Visit
Admission: RE | Admit: 2015-12-21 | Discharge: 2015-12-21 | Disposition: A | Discharge status: Home / Self Care | Payer: Medicare Other | Source: Ambulatory Visit | Attending: Orthopedic Surgery | Admitting: Orthopedic Surgery

## 2015-12-21 ENCOUNTER — Encounter
Admission: RE | Disposition: A | Discharge status: Home / Self Care | Payer: Self-pay | Source: Ambulatory Visit | Attending: Orthopedic Surgery

## 2015-12-21 DIAGNOSIS — Z87891 Personal history of nicotine dependence: Secondary | ICD-10-CM | POA: Diagnosis not present

## 2015-12-21 DIAGNOSIS — Z820 Family history of epilepsy and other diseases of the nervous system: Secondary | ICD-10-CM | POA: Diagnosis not present

## 2015-12-21 DIAGNOSIS — M25552 Pain in left hip: Secondary | ICD-10-CM | POA: Diagnosis not present

## 2015-12-21 DIAGNOSIS — Z79899 Other long term (current) drug therapy: Secondary | ICD-10-CM | POA: Diagnosis not present

## 2015-12-21 DIAGNOSIS — T8484XA Pain due to internal orthopedic prosthetic devices, implants and grafts, initial encounter: Secondary | ICD-10-CM | POA: Diagnosis present

## 2015-12-21 DIAGNOSIS — M1990 Unspecified osteoarthritis, unspecified site: Secondary | ICD-10-CM | POA: Diagnosis not present

## 2015-12-21 DIAGNOSIS — Z888 Allergy status to other drugs, medicaments and biological substances status: Secondary | ICD-10-CM | POA: Diagnosis not present

## 2015-12-21 DIAGNOSIS — Y831 Surgical operation with implant of artificial internal device as the cause of abnormal reaction of the patient, or of later complication, without mention of misadventure at the time of the procedure: Secondary | ICD-10-CM | POA: Diagnosis not present

## 2015-12-21 DIAGNOSIS — I493 Ventricular premature depolarization: Secondary | ICD-10-CM | POA: Diagnosis not present

## 2015-12-21 DIAGNOSIS — F329 Major depressive disorder, single episode, unspecified: Secondary | ICD-10-CM | POA: Insufficient documentation

## 2015-12-21 DIAGNOSIS — E079 Disorder of thyroid, unspecified: Secondary | ICD-10-CM | POA: Diagnosis not present

## 2015-12-21 DIAGNOSIS — D649 Anemia, unspecified: Secondary | ICD-10-CM | POA: Insufficient documentation

## 2015-12-21 DIAGNOSIS — Z01818 Encounter for other preprocedural examination: Secondary | ICD-10-CM

## 2015-12-21 DIAGNOSIS — M810 Age-related osteoporosis without current pathological fracture: Secondary | ICD-10-CM | POA: Diagnosis not present

## 2015-12-21 DIAGNOSIS — G4700 Insomnia, unspecified: Secondary | ICD-10-CM | POA: Insufficient documentation

## 2015-12-21 DIAGNOSIS — Z801 Family history of malignant neoplasm of trachea, bronchus and lung: Secondary | ICD-10-CM | POA: Diagnosis not present

## 2015-12-21 DIAGNOSIS — Z96641 Presence of right artificial hip joint: Secondary | ICD-10-CM | POA: Diagnosis not present

## 2015-12-21 DIAGNOSIS — Z9889 Other specified postprocedural states: Secondary | ICD-10-CM

## 2015-12-21 HISTORY — DX: Anemia, unspecified: D64.9

## 2015-12-21 HISTORY — DX: Adverse effect of unspecified anesthetic, initial encounter: T41.45XA

## 2015-12-21 HISTORY — PX: HARDWARE REMOVAL: SHX979

## 2015-12-21 HISTORY — DX: Other complications of anesthesia, initial encounter: T88.59XA

## 2015-12-21 HISTORY — DX: Unspecified osteoarthritis, unspecified site: M19.90

## 2015-12-21 LAB — BASIC METABOLIC PANEL
Anion gap: 10 (ref 5–15)
BUN: 7 mg/dL (ref 6–20)
CALCIUM: 9.6 mg/dL (ref 8.9–10.3)
CHLORIDE: 96 mmol/L — AB (ref 101–111)
CO2: 28 mmol/L (ref 22–32)
CREATININE: 0.46 mg/dL (ref 0.44–1.00)
Glucose, Bld: 92 mg/dL (ref 65–99)
Potassium: 5.1 mmol/L (ref 3.5–5.1)
SODIUM: 134 mmol/L — AB (ref 135–145)

## 2015-12-21 LAB — PROTIME-INR
INR: 0.88
Prothrombin Time: 12.2 seconds (ref 11.4–15.0)

## 2015-12-21 LAB — CBC WITH DIFFERENTIAL/PLATELET
BASOS ABS: 0 10*3/uL (ref 0–0.1)
Basophils Relative: 0 %
Eosinophils Absolute: 0 10*3/uL (ref 0–0.7)
Eosinophils Relative: 1 %
HEMATOCRIT: 43 % (ref 35.0–47.0)
HEMOGLOBIN: 14.2 g/dL (ref 12.0–16.0)
LYMPHS PCT: 15 %
Lymphs Abs: 1.4 10*3/uL (ref 1.0–3.6)
MCH: 32.9 pg (ref 26.0–34.0)
MCHC: 33.1 g/dL (ref 32.0–36.0)
MCV: 99.2 fL (ref 80.0–100.0)
MONO ABS: 0.7 10*3/uL (ref 0.2–0.9)
MONOS PCT: 8 %
NEUTROS ABS: 6.7 10*3/uL — AB (ref 1.4–6.5)
Neutrophils Relative %: 76 %
Platelets: 310 10*3/uL (ref 150–440)
RBC: 4.33 MIL/uL (ref 3.80–5.20)
RDW: 13.5 % (ref 11.5–14.5)
WBC: 8.8 10*3/uL (ref 3.6–11.0)

## 2015-12-21 LAB — APTT: APTT: 28 s (ref 24–36)

## 2015-12-21 IMAGING — CR DG HIP (WITH PELVIS) OPERATIVE*L*
4 series · 4 of 4 positions shown · non-contrast
Comparison: None in PACs

CLINICAL DATA: Intraoperative fluoro spot images for hardware
removal from previous left hip pinning

EXAM:
OPERATIVE left HIP (WITH PELVIS IF PERFORMED)  VIEWS
TECHNIQUE: Fluoroscopic spot image(s) were submitted for interpretation
post-operatively.

[[person_name]2]
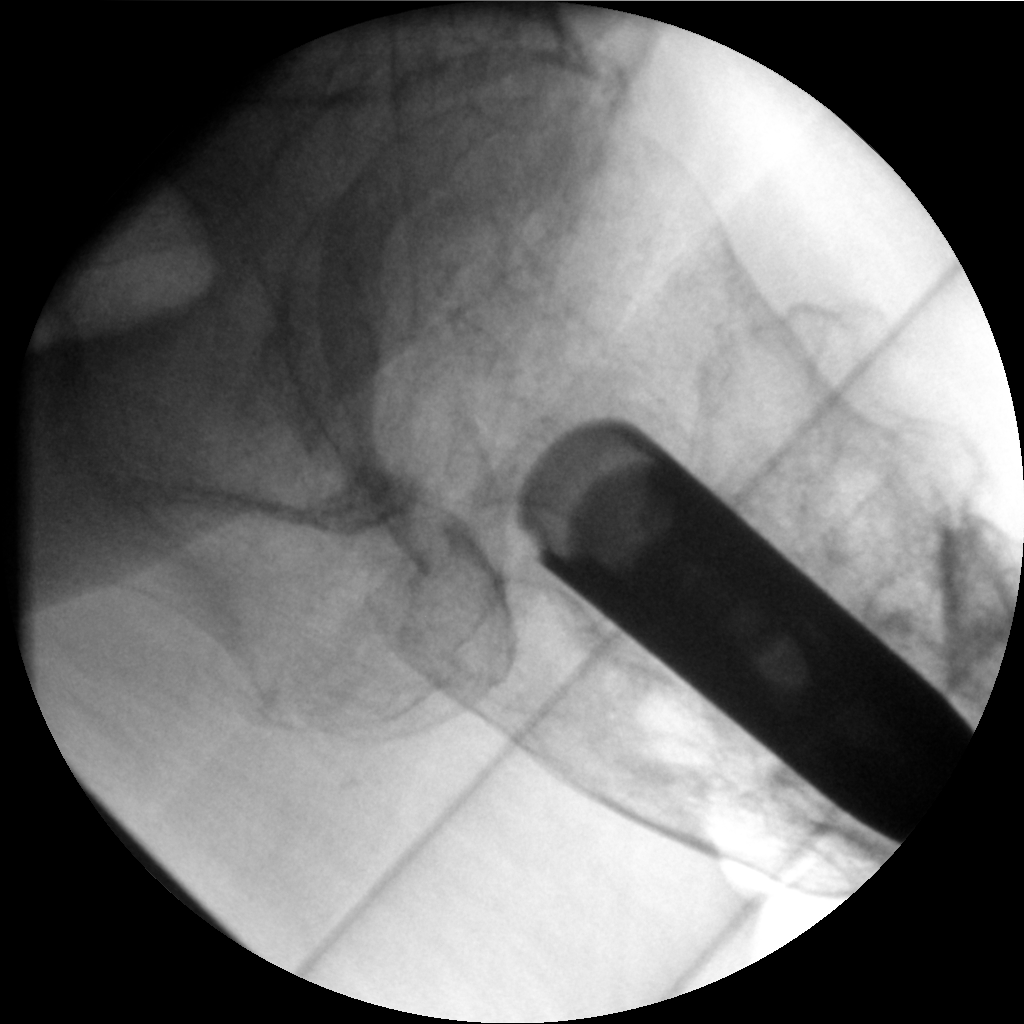

[[person_name]1]
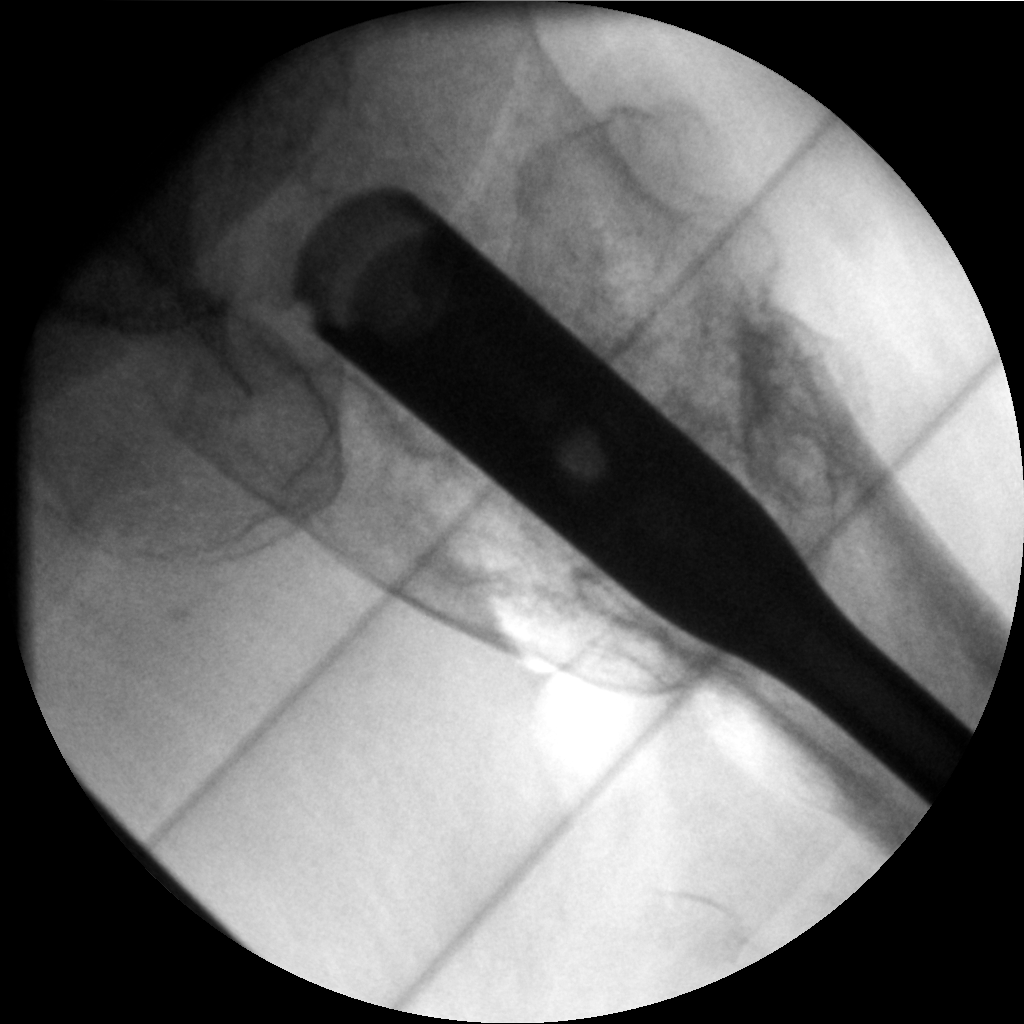

[[person_name] ap]
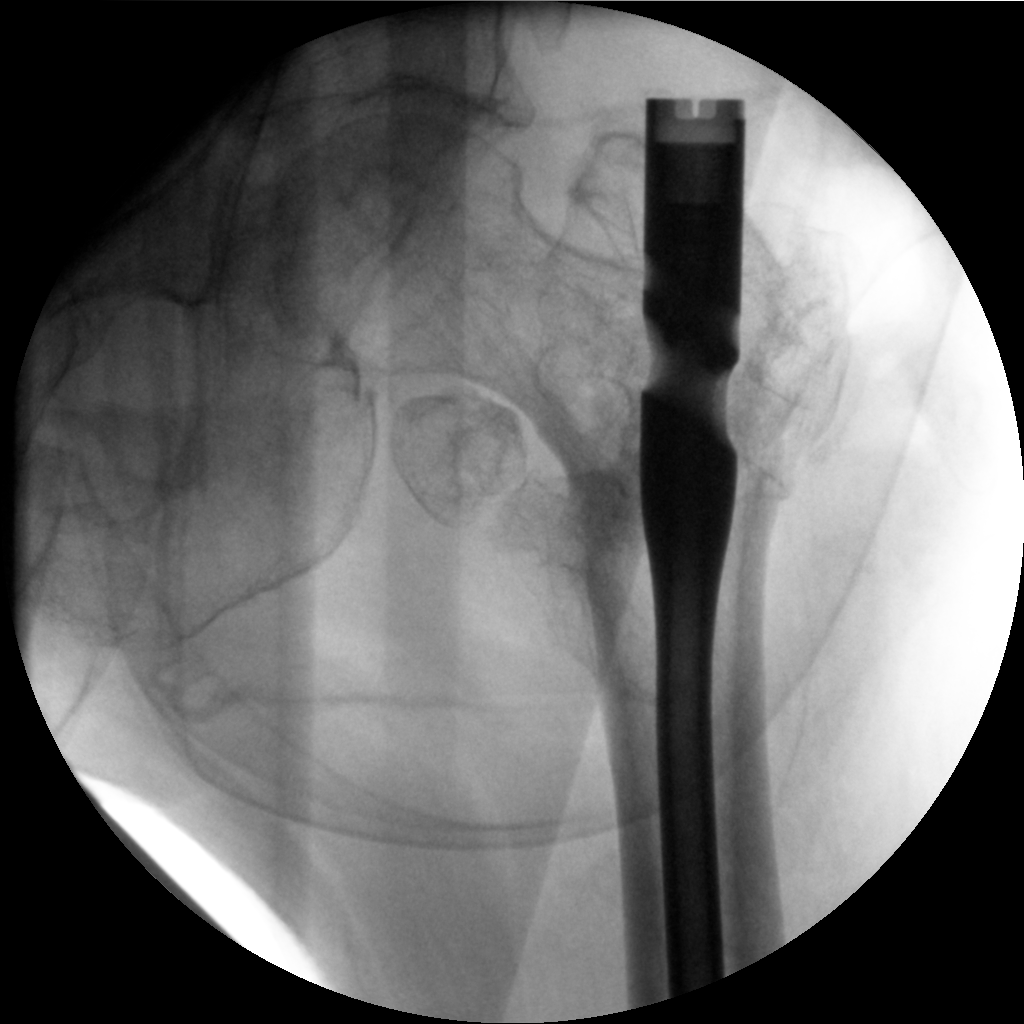

[[person_name] hip]
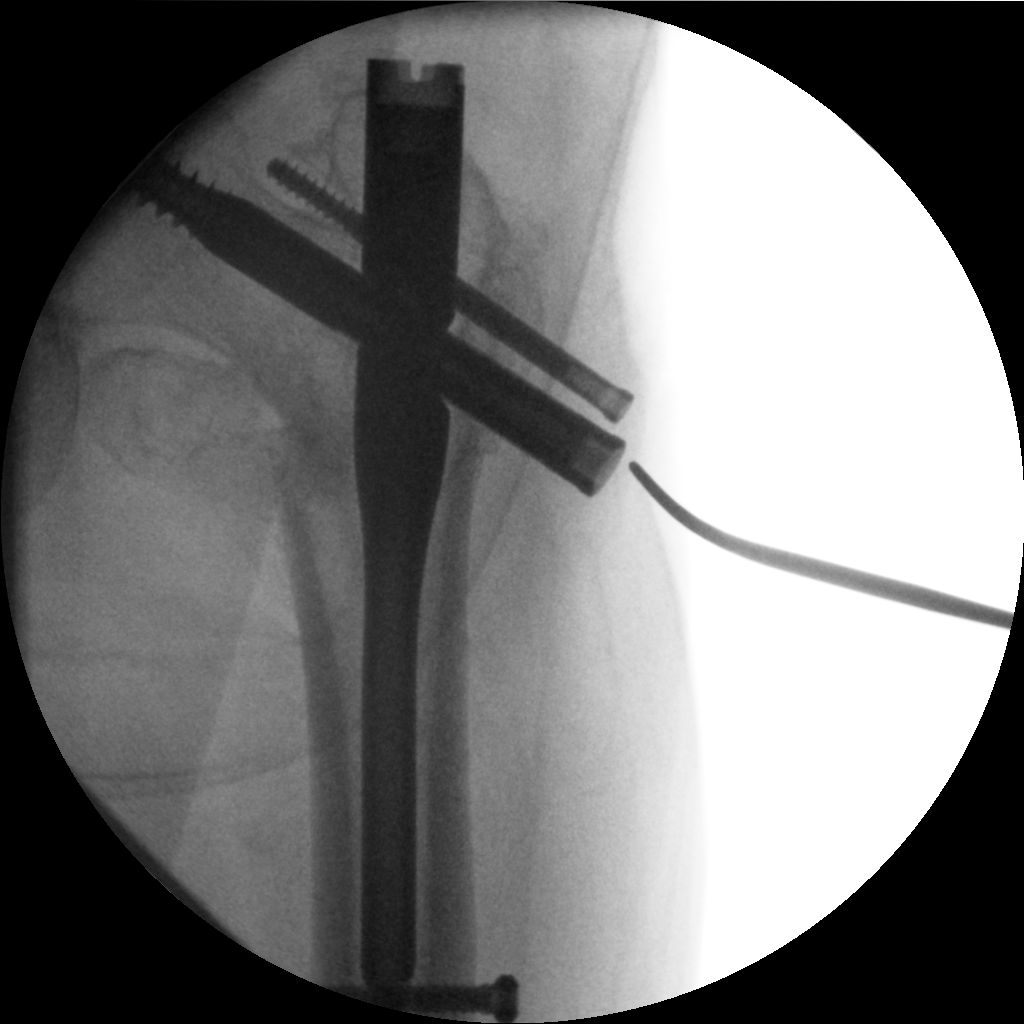

[4 of 4 positions shown; findings below may reference images not displayed]

FINDINGS: Fluoro time is reported as 21 seconds. Four fluoro spot images
reveal removal of the 2 telescoping screws from the neck and
trochanteric region of the left hip. The into medullary rod remains.
IMPRESSION: Fluoro spot images from a procedure row to remove the telescoping
screws from the femoral neck and intertrochanteric region of the
left hip. No immediate postprocedure complication.

## 2015-12-21 SURGERY — REMOVAL, HARDWARE
Anesthesia: General | Laterality: Left

## 2015-12-21 MED ORDER — LACTATED RINGERS IV SOLN
INTRAVENOUS | Status: DC
  Administered 2015-12-21: 11:00:00 via INTRAVENOUS

## 2015-12-21 MED ORDER — FENTANYL CITRATE (PF) 100 MCG/2ML IJ SOLN
INTRAMUSCULAR | Status: DC | PRN
  Administered 2015-12-21 (×3): 50 ug via INTRAVENOUS

## 2015-12-21 MED ORDER — DEXAMETHASONE SODIUM PHOSPHATE 10 MG/ML IJ SOLN
INTRAMUSCULAR | Status: DC | PRN
  Administered 2015-12-21: 5 mg via INTRAVENOUS

## 2015-12-21 MED ORDER — FAMOTIDINE 20 MG PO TABS
ORAL_TABLET | ORAL | Status: AC
  Filled 2015-12-21: qty 1

## 2015-12-21 MED ORDER — ONDANSETRON HCL 4 MG/2ML IJ SOLN
INTRAMUSCULAR | Status: AC
  Filled 2015-12-21: qty 2

## 2015-12-21 MED ORDER — ONDANSETRON HCL 4 MG/2ML IJ SOLN
4.0000 mg | Freq: Once | INTRAMUSCULAR | Status: AC | PRN
  Administered 2015-12-21: 4 mg via INTRAVENOUS

## 2015-12-21 MED ORDER — KETAMINE HCL 10 MG/ML IJ SOLN
INTRAMUSCULAR | Status: DC | PRN
  Administered 2015-12-21: 20 mg via INTRAVENOUS

## 2015-12-21 MED ORDER — CEFAZOLIN SODIUM-DEXTROSE 2-3 GM-% IV SOLR
2.0000 g | INTRAVENOUS | Status: AC
  Administered 2015-12-21: 2 g via INTRAVENOUS

## 2015-12-21 MED ORDER — FENTANYL CITRATE (PF) 100 MCG/2ML IJ SOLN
25.0000 ug | INTRAMUSCULAR | Status: DC | PRN
  Administered 2015-12-21 (×4): 25 ug via INTRAVENOUS

## 2015-12-21 MED ORDER — BUPIVACAINE HCL (PF) 0.5 % IJ SOLN
INTRAMUSCULAR | Status: AC
  Filled 2015-12-21: qty 30

## 2015-12-21 MED ORDER — KETOROLAC TROMETHAMINE 30 MG/ML IJ SOLN
INTRAMUSCULAR | Status: AC
  Administered 2015-12-21: 30 mg via INTRAVENOUS
  Filled 2015-12-21: qty 1

## 2015-12-21 MED ORDER — FENTANYL CITRATE (PF) 100 MCG/2ML IJ SOLN
INTRAMUSCULAR | Status: AC
  Administered 2015-12-21: 25 ug via INTRAVENOUS
  Filled 2015-12-21: qty 2

## 2015-12-21 MED ORDER — ONDANSETRON HCL 4 MG/2ML IJ SOLN
INTRAMUSCULAR | Status: DC | PRN
  Administered 2015-12-21: 4 mg via INTRAVENOUS

## 2015-12-21 MED ORDER — CHLORHEXIDINE GLUCONATE 4 % EX LIQD: 1.0000 "application " | Freq: Once | CUTANEOUS | Status: DC

## 2015-12-21 MED ORDER — OXYCODONE HCL 5 MG PO TABS: 5.0000 mg | ORAL_TABLET | ORAL | Status: DC | PRN

## 2015-12-21 MED ORDER — KETOROLAC TROMETHAMINE 30 MG/ML IJ SOLN
30.0000 mg | Freq: Once | INTRAMUSCULAR | Status: AC
  Administered 2015-12-21: 30 mg via INTRAVENOUS

## 2015-12-21 MED ORDER — GLYCOPYRROLATE 0.2 MG/ML IJ SOLN
INTRAMUSCULAR | Status: DC | PRN
  Administered 2015-12-21: 0.2 mg via INTRAVENOUS

## 2015-12-21 MED ORDER — OXYCODONE HCL 5 MG PO TABS
ORAL_TABLET | ORAL | Status: AC
  Administered 2015-12-21: 5 mg via ORAL
  Filled 2015-12-21: qty 1

## 2015-12-21 MED ORDER — PROPOFOL 10 MG/ML IV BOLUS
INTRAVENOUS | Status: DC | PRN
  Administered 2015-12-21: 30 mg via INTRAVENOUS
  Administered 2015-12-21: 120 mg via INTRAVENOUS

## 2015-12-21 MED ORDER — NEOMYCIN-POLYMYXIN B GU 40-200000 IR SOLN
Status: AC
  Filled 2015-12-21: qty 4

## 2015-12-21 MED ORDER — CHLORHEXIDINE GLUCONATE 4 % EX LIQD
1.0000 | Freq: Once | CUTANEOUS | Status: DC
Start: 2015-12-22 — End: 2015-12-21

## 2015-12-21 MED ORDER — PHENYLEPHRINE HCL 10 MG/ML IJ SOLN
INTRAMUSCULAR | Status: DC | PRN
  Administered 2015-12-21: 100 ug via INTRAVENOUS

## 2015-12-21 MED ORDER — OXYCODONE HCL 5 MG PO TABS
5.0000 mg | ORAL_TABLET | Freq: Once | ORAL | Status: AC
  Administered 2015-12-21: 5 mg via ORAL

## 2015-12-21 MED ORDER — MIDAZOLAM HCL 5 MG/5ML IJ SOLN
INTRAMUSCULAR | Status: DC | PRN
  Administered 2015-12-21: 2 mg via INTRAVENOUS

## 2015-12-21 MED ORDER — LIDOCAINE HCL (PF) 2 % IJ SOLN
INTRAMUSCULAR | Status: DC | PRN
  Administered 2015-12-21: 50 mg

## 2015-12-21 MED ORDER — CEFAZOLIN SODIUM-DEXTROSE 2-3 GM-% IV SOLR
INTRAVENOUS | Status: AC
  Filled 2015-12-21: qty 50

## 2015-12-21 MED ORDER — FAMOTIDINE 20 MG PO TABS
20.0000 mg | ORAL_TABLET | Freq: Once | ORAL | Status: AC
  Administered 2015-12-21: 20 mg via ORAL

## 2015-12-21 SURGICAL SUPPLY — 38 items
BLADE SURG 15 STRL LF DISP TIS (BLADE) ×1 IMPLANT
BLADE SURG 15 STRL SS (BLADE) ×2
BNDG ESMARK 6X12 TAN STRL LF (GAUZE/BANDAGES/DRESSINGS) IMPLANT
BONE CHIP PRESERV 20CC (Bone Implant) ×3 IMPLANT
CANISTER SUCT 1200ML W/VALVE (MISCELLANEOUS) ×3 IMPLANT
CLOSURE WOUND 1/2 X4 (GAUZE/BANDAGES/DRESSINGS)
DRAPE FLUOR MINI C-ARM 54X84 (DRAPES) IMPLANT
DRAPE INCISE IOBAN 66X45 STRL (DRAPES) IMPLANT
DRAPE U-SHAPE 47X51 STRL (DRAPES) ×3 IMPLANT
DRSG OPSITE POSTOP 3X4 (GAUZE/BANDAGES/DRESSINGS) ×6 IMPLANT
DURAPREP 26ML APPLICATOR (WOUND CARE) ×3 IMPLANT
ELECT REM PT RETURN 9FT ADLT (ELECTROSURGICAL) ×3
ELECTRODE REM PT RTRN 9FT ADLT (ELECTROSURGICAL) ×1 IMPLANT
GAUZE PETRO XEROFOAM 1X8 (MISCELLANEOUS) IMPLANT
GAUZE SPONGE 4X4 12PLY STRL (GAUZE/BANDAGES/DRESSINGS) IMPLANT
GLOVE BIOGEL PI IND STRL 9 (GLOVE) ×1 IMPLANT
GLOVE BIOGEL PI INDICATOR 9 (GLOVE) ×2
GLOVE SURG 9.0 ORTHO LTXF (GLOVE) ×6 IMPLANT
GOWN STRL REUS TWL 2XL XL LVL4 (GOWN DISPOSABLE) ×3 IMPLANT
GOWN STRL REUS W/ TWL LRG LVL3 (GOWN DISPOSABLE) ×1 IMPLANT
GOWN STRL REUS W/TWL LRG LVL3 (GOWN DISPOSABLE) ×2
KIT RM TURNOVER STRD PROC AR (KITS) ×3 IMPLANT
LABEL OR SOLS (LABEL) ×3 IMPLANT
NS IRRIG 1000ML POUR BTL (IV SOLUTION) ×3 IMPLANT
PACK EXTREMITY ARMC (MISCELLANEOUS) IMPLANT
PACK HIP COMPR (MISCELLANEOUS) ×3 IMPLANT
PAD ABD DERMACEA PRESS 5X9 (GAUZE/BANDAGES/DRESSINGS) IMPLANT
PAD CAST CTTN 4X4 STRL (SOFTGOODS) IMPLANT
PADDING CAST COTTON 4X4 STRL (SOFTGOODS)
SPLINT CAST 1 STEP 4X30 (MISCELLANEOUS) IMPLANT
SPONGE LAP 18X18 5 PK (GAUZE/BANDAGES/DRESSINGS) ×3 IMPLANT
STAPLER SKIN PROX 35W (STAPLE) ×3 IMPLANT
STOCKINETTE STRL 6IN 960660 (GAUZE/BANDAGES/DRESSINGS) ×3 IMPLANT
STRIP CLOSURE SKIN 1/2X4 (GAUZE/BANDAGES/DRESSINGS) IMPLANT
SUT VIC AB 2-0 SH 27 (SUTURE) ×2
SUT VIC AB 2-0 SH 27XBRD (SUTURE) ×1 IMPLANT
SYR 30ML LL (SYRINGE) ×3 IMPLANT
TAPE MICROPORE 2IN (TAPE) IMPLANT

## 2015-12-21 NOTE — Anesthesia Procedure Notes (Signed)
Procedure Name: LMA Insertion Performed by: Avyan Livesay Pre-anesthesia Checklist: Patient identified, Patient being monitored, Timeout performed, Emergency Drugs available and Suction available Patient Re-evaluated:Patient Re-evaluated prior to inductionOxygen Delivery Method: Circle system utilized Preoxygenation: Pre-oxygenation with 100% oxygen Intubation Type: IV induction Ventilation: Mask ventilation without difficulty LMA: LMA inserted LMA Size: 3.0 Tube type: Oral Number of attempts: 1 Placement Confirmation: positive ETCO2 and breath sounds checked- equal and bilateral Tube secured with: Tape Dental Injury: Teeth and Oropharynx as per pre-operative assessment      

## 2015-12-21 NOTE — Anesthesia Postprocedure Evaluation (Signed)
Anesthesia Post Note  Patient: Nicole Butler  Procedure(s) Performed: Procedure(s) (LRB): HARDWARE REMOVAL (Left)  Patient location during evaluation: PACU Anesthesia Type: General Level of consciousness: awake Pain management: satisfactory to patient Vital Signs Assessment: post-procedure vital signs reviewed and stable Respiratory status: nonlabored ventilation Cardiovascular status: stable Anesthetic complications: no    Last Vitals:  Filed Vitals:   12/21/15 0955 12/21/15 1242  BP: 165/98 146/82  Pulse: 94 81  Temp: 36.7 C 36.4 C  Resp: 18 13    Last Pain:  Filed Vitals:   12/21/15 1251  PainSc: 7                  VAN STAVEREN,Heela Heishman

## 2015-12-21 NOTE — Progress Notes (Signed)
zofran given for nausea  

## 2015-12-21 NOTE — Progress Notes (Signed)
0115:

## 2015-12-21 NOTE — Anesthesia Preprocedure Evaluation (Signed)
Anesthesia Evaluation  Patient identified by MRN, date of birth, ID band Patient awake, Patient confused and Patient unresponsive    Airway Mallampati: II       Dental  (+) Upper Dentures, Lower Dentures   Pulmonary former smoker,    breath sounds clear to auscultation       Cardiovascular Exercise Tolerance: Good  Rhythm:Regular Rate:Normal     Neuro/Psych Depression    GI/Hepatic negative GI ROS, Neg liver ROS,   Endo/Other  negative endocrine ROS  Renal/GU negative Renal ROS     Musculoskeletal   Abdominal Normal abdominal exam  (+)   Peds negative pediatric ROS (+)  Hematology  (+) anemia ,   Anesthesia Other Findings   Reproductive/Obstetrics                             Anesthesia Physical Anesthesia Plan  ASA: II  Anesthesia Plan: General   Post-op Pain Management:    Induction: Intravenous  Airway Management Planned: LMA  Additional Equipment:   Intra-op Plan:   Post-operative Plan: Extubation in OR  Informed Consent: I have reviewed the patients History and Physical, chart, labs and discussed the procedure including the risks, benefits and alternatives for the proposed anesthesia with the patient or authorized representative who has indicated his/her understanding and acceptance.     Plan Discussed with: CRNA  Anesthesia Plan Comments:         Anesthesia Quick Evaluation

## 2015-12-21 NOTE — H&P (Signed)
PREOPERATIVE H&P  Chief Complaint: Painful hardware, left hip.  HPI: Nicole Butler is a 70 y.o. female who presents for preoperative history and physical with a diagnosis of painful hardware, left hip.  Patient had progressive pain in the left hip. Her intertrochanteric hip fracture has healed. She had compression at the fracture site during the healing process. Her leg screw and derotational screw are now prominent and causing her lateral hip pain. Symptoms are rated as moderate to severe, and have been worsening.  This is significantly impairing activities of daily living.  She has elected for surgical management.   Past Medical History  Diagnosis Date  . Osteopenia   . Osteoporosis   . Thyroid disease   . Insomnia   . Hip pain     left  . Arthritis   . Anemia     H/O   . Complication of anesthesia     PT STATES SHE FORGETS TO BREATHE WHEN COMING OUT OF ANESTHESIA   Past Surgical History  Procedure Laterality Date  . Total hip arthroplasty Right     x4  . Knee surgery Right     x2  . Foot surgery Left     x3  . Leg surgery Left     x2  . Ankle surgery Left    Social History   Social History  . Marital Status: Single    Spouse Name: N/A  . Number of Children: N/A  . Years of Education: N/A   Social History Main Topics  . Smoking status: Former Smoker -- 1.00 packs/day for 10 years    Types: Cigarettes    Quit date: 12/15/1995  . Smokeless tobacco: Never Used  . Alcohol Use: 4.8 oz/week    8 Glasses of wine per week     Comment: 2 GLASSES EVERY EVENING  . Drug Use: No  . Sexual Activity: Not Asked   Other Topics Concern  . None   Social History Narrative   Family History  Problem Relation Age of Onset  . Alzheimer's disease Mother   . Cancer Father     lung   Allergies  Allergen Reactions  . Butrans [Buprenorphine] Hives  . Fosamax [Alendronate Sodium] Other (See Comments)    dysphagia   Prior to Admission medications   Medication Sig Start Date End  Date Taking? Authorizing Provider  diclofenac sodium (VOLTAREN) 1 % GEL Apply topically 4 (four) times daily.   Yes Historical Provider, MD  ondansetron (ZOFRAN) 4 MG tablet Take 1 tablet (4 mg total) by mouth daily as needed for nausea or vomiting. 10/02/15  Yes Gabriel Cirri, NP  oxyCODONE-acetaminophen (PERCOCET/ROXICET) 5-325 MG tablet Take 1 tablet by mouth every 4 (four) hours as needed for severe pain.   Yes Historical Provider, MD  raloxifene (EVISTA) 60 MG tablet Take 1 tablet (60 mg total) by mouth daily. 10/02/15  Yes Gabriel Cirri, NP  tiZANidine (ZANAFLEX) 4 MG capsule Take 4 mg by mouth 3 (three) times daily as needed for muscle spasms.   Yes Historical Provider, MD  traZODone (DESYREL) 50 MG tablet take 2 tablets by mouth at bedtime 11/02/15  Yes Gabriel Cirri, NP     Positive ROS: All other systems have been reviewed and were otherwise negative with the exception of those mentioned in the HPI and as above.  Physical Exam: General: Alert, no acute distress Cardiovascular: Regular rate and rhythm, no murmurs rubs or gallops.  No pedal edema Respiratory: Clear to auscultation bilaterally, no wheezes rales  or rhonchi. No cyanosis, no use of accessory musculature GI: No organomegaly, abdomen is soft and non-tender nondistended with positive bowel sounds. Skin: Skin intact, no lesions within the operative field. Neurologic: Sensation intact distally Psychiatric: Patient is competent for consent with normal mood and affect Lymphatic: No cervical lymphadenopathy  MUSCULOSKELETAL: Left hip: Patient's incisions are clean dry and intact. She has point tenderness over the lateral hip. There is no erythema or ecchymosis or swelling. The lag screw and derotational screw are palpable through the skin. There is no skin tenting or compromise. She is neurovascularly intact.  Assessment: Painful hardware, left hip  Plan: Plan for Procedure(s): HARDWARE REMOVAL FOR PAINFUL LEFT HIP  HARDWARE  I explained to Ms. Filkins the plan for surgery today is to remove the lag screw and derotational screw. I discussed the risks of possible fracture after screw removal. We discussed the possibility of placing a shorter lag screw and locking it in place to protect the femoral neck. However the patient does not want hardware put back in. She has a history of hardware failure and migration. She feels the less hardware in place, the better. I intend to leave the intramedullary rod with the distal interlocking screw in place today as these show no evidence of failure loosening or migration. Patient will use her walker for the next 6 weeks with partial weightbearing to protect the femoral neck.  I discussed the risks and benefits of surgery. The risks include but are not limited to infection, fracture of the femoral neck, bleeding, nerve or blood vessel injury, joint stiffness or loss of motion, persistent pain, weakness or instability and hardware failure and the need for further surgery. Medical risks include but are not limited to DVT and pulmonary embolism, myocardial infarction, stroke, pneumonia, respiratory failure and death. Patient understood these risks and wished to proceed.      Juanell Fairly, MD   12/21/2015 11:12 AM

## 2015-12-21 NOTE — Op Note (Signed)
12/21/2015  12:44 PM  PATIENT:  Nicole Butler    PRE-OPERATIVE DIAGNOSIS:  Left hip painful hardware  POST-OPERATIVE DIAGNOSIS:  Same  PROCEDURE:  HARDWARE REMOVAL  SURGEON:  Thornton Park, MD  ANESTHESIA:   General  PREOPERATIVE INDICATIONS:  Nicole Butler is a  70 y.o. female with a diagnosis of painful left hip hardware who wished to proceed with surgical removal..    I discussed the risks and benefits of surgery. The risks include but are not limited to refracture of left hip, infection, bleeding requiring blood transfusion, nerve or blood vessel injury, joint stiffness or loss of motion, persistent pain, weakness or instability, and hardware failure and the need for further surgery. Medical risks include but are not limited to DVT and pulmonary embolism, myocardial infarction, stroke, pneumonia, respiratory failure and death. Patient understood these risks and wished to proceed.    OPERATIVE FINDINGS: Prominent left hip hardware  OPERATIVE PROCEDURE: Patient was met in the preoperative area. The left hip was signed with my initials and the word yes according the hospital's correct site of surgery protocol. She was brought to the operating room. She was placed supine on the fracture table. She underwent general endotracheal intubation. She none the right leg placed in a hemi-lithotomy position. The left leg was placed in a leg holder. No traction was applied. Patient was prepped and draped in sterile fashion. Time out was performed to verify the patient's name, date of birth, medical record number, correct site of surgery correct procedure to be performed. Was also used to from the patient received antibiotics and all appropriate instruments, implants and radiographic studies were available in the room. Once all in attendance were in agreement case began. The patient received Ancef IV prior to the onset of the case.  Patient's hardware was palpable through the skin over the lateral hip.  There is no skin tenting or skin compromise however. C-arm images were taken to confirm the position of the screws. An incision in line with the femur was made over the screws. A second incision was then made superior to the tip of the greater trochanter. The screwdriver. The set screw was placed through the top of the nail and loosened. The screwdriver for the lag screw was then attached to the base of the lag screw.. The lag screw was then removed manually. A second screwdriver was use to remove the derotational screw. This was also removed manually. C-arm images were taken in both AP and lateral planes confirmed complete hardware removal. The set screw was retightened in position. The short intramedullary rod and distal interlocking screw were left in place. Patient had the lag screw hole was bone grafted with cancellus chips.  The wounds were copiously irrigated. 0 Vicryl was used to close the deep fascia. 2-0 Vicryl was used to close the subcutaneous tissue and staples were used to approximate the skin. Dry sterile bandage was applied over each incision.  I was scrubbed and present the entire case and all sharp and instrument counts were correct at conclusion the case. I spoke with the patient's family member to let him know the case had been completed without complication and the patient was stable in recovery room.   Timoteo Gaul, MD

## 2015-12-21 NOTE — Transfer of Care (Signed)
Immediate Anesthesia Transfer of Care Note  Patient: Nicole Butler  Procedure(s) Performed: Procedure(s) with comments: HARDWARE REMOVAL (Left) - Left HIP   Patient Location: PACU  Anesthesia Type:General  Level of Consciousness: sedated  Airway & Oxygen Therapy: Patient Spontanous Breathing and Patient connected to face mask oxygen  Post-op Assessment: Report given to RN  Post vital signs: Reviewed  Last Vitals:  Filed Vitals:   12/21/15 0955 12/21/15 1242  BP: 165/98 146/82  Pulse: 94 81  Temp: 36.7 C   Resp: 18 13    Complications: No apparent anesthesia complications

## 2015-12-21 NOTE — Progress Notes (Signed)
Pt takes oxycodone  At home  Allergy noted  Pt given pill

## 2015-12-22 ENCOUNTER — Encounter: Payer: Self-pay | Admitting: Orthopedic Surgery

## 2016-02-06 ENCOUNTER — Other Ambulatory Visit: Payer: Self-pay | Admitting: Unknown Physician Specialty

## 2016-02-06 NOTE — Telephone Encounter (Signed)
Your patient 

## 2016-02-11 ENCOUNTER — Observation Stay
Admission: EM | Admit: 2016-02-11 | Discharge: 2016-02-12 | Disposition: A | Discharge status: Home / Self Care | Payer: Medicare Other | Attending: Internal Medicine | Admitting: Internal Medicine

## 2016-02-11 ENCOUNTER — Encounter: Payer: Self-pay | Admitting: Emergency Medicine

## 2016-02-11 ENCOUNTER — Emergency Department: Payer: Medicare Other

## 2016-02-11 DIAGNOSIS — E871 Hypo-osmolality and hyponatremia: Secondary | ICD-10-CM | POA: Insufficient documentation

## 2016-02-11 DIAGNOSIS — M1990 Unspecified osteoarthritis, unspecified site: Secondary | ICD-10-CM | POA: Insufficient documentation

## 2016-02-11 DIAGNOSIS — Z888 Allergy status to other drugs, medicaments and biological substances status: Secondary | ICD-10-CM | POA: Insufficient documentation

## 2016-02-11 DIAGNOSIS — R110 Nausea: Secondary | ICD-10-CM | POA: Insufficient documentation

## 2016-02-11 DIAGNOSIS — M25562 Pain in left knee: Secondary | ICD-10-CM | POA: Diagnosis not present

## 2016-02-11 DIAGNOSIS — Z801 Family history of malignant neoplasm of trachea, bronchus and lung: Secondary | ICD-10-CM | POA: Insufficient documentation

## 2016-02-11 DIAGNOSIS — Z79899 Other long term (current) drug therapy: Secondary | ICD-10-CM | POA: Diagnosis not present

## 2016-02-11 DIAGNOSIS — F341 Dysthymic disorder: Secondary | ICD-10-CM | POA: Diagnosis not present

## 2016-02-11 DIAGNOSIS — E079 Disorder of thyroid, unspecified: Secondary | ICD-10-CM | POA: Diagnosis not present

## 2016-02-11 DIAGNOSIS — S72402A Unspecified fracture of lower end of left femur, initial encounter for closed fracture: Secondary | ICD-10-CM | POA: Diagnosis present

## 2016-02-11 DIAGNOSIS — S72492A Other fracture of lower end of left femur, initial encounter for closed fracture: Secondary | ICD-10-CM | POA: Diagnosis not present

## 2016-02-11 DIAGNOSIS — D649 Anemia, unspecified: Secondary | ICD-10-CM | POA: Diagnosis not present

## 2016-02-11 DIAGNOSIS — W19XXXA Unspecified fall, initial encounter: Secondary | ICD-10-CM | POA: Diagnosis not present

## 2016-02-11 DIAGNOSIS — G894 Chronic pain syndrome: Secondary | ICD-10-CM | POA: Insufficient documentation

## 2016-02-11 DIAGNOSIS — Y939 Activity, unspecified: Secondary | ICD-10-CM | POA: Insufficient documentation

## 2016-02-11 DIAGNOSIS — Y92000 Kitchen of unspecified non-institutional (private) residence as  the place of occurrence of the external cause: Secondary | ICD-10-CM | POA: Diagnosis not present

## 2016-02-11 DIAGNOSIS — G4700 Insomnia, unspecified: Secondary | ICD-10-CM | POA: Insufficient documentation

## 2016-02-11 DIAGNOSIS — F419 Anxiety disorder, unspecified: Secondary | ICD-10-CM | POA: Insufficient documentation

## 2016-02-11 DIAGNOSIS — E559 Vitamin D deficiency, unspecified: Secondary | ICD-10-CM | POA: Diagnosis not present

## 2016-02-11 DIAGNOSIS — Z820 Family history of epilepsy and other diseases of the nervous system: Secondary | ICD-10-CM | POA: Insufficient documentation

## 2016-02-11 DIAGNOSIS — S72002A Fracture of unspecified part of neck of left femur, initial encounter for closed fracture: Secondary | ICD-10-CM | POA: Diagnosis present

## 2016-02-11 DIAGNOSIS — D7589 Other specified diseases of blood and blood-forming organs: Secondary | ICD-10-CM | POA: Insufficient documentation

## 2016-02-11 DIAGNOSIS — M8580 Other specified disorders of bone density and structure, unspecified site: Secondary | ICD-10-CM | POA: Diagnosis not present

## 2016-02-11 DIAGNOSIS — F329 Major depressive disorder, single episode, unspecified: Secondary | ICD-10-CM | POA: Diagnosis not present

## 2016-02-11 DIAGNOSIS — M25552 Pain in left hip: Secondary | ICD-10-CM | POA: Insufficient documentation

## 2016-02-11 DIAGNOSIS — S7292XA Unspecified fracture of left femur, initial encounter for closed fracture: Secondary | ICD-10-CM | POA: Diagnosis present

## 2016-02-11 DIAGNOSIS — Z87891 Personal history of nicotine dependence: Secondary | ICD-10-CM | POA: Diagnosis not present

## 2016-02-11 DIAGNOSIS — Z96641 Presence of right artificial hip joint: Secondary | ICD-10-CM | POA: Insufficient documentation

## 2016-02-11 LAB — CBC WITH DIFFERENTIAL/PLATELET
BASOS ABS: 0 10*3/uL (ref 0–0.1)
BASOS PCT: 0 %
Eosinophils Absolute: 0 10*3/uL (ref 0–0.7)
Eosinophils Relative: 0 %
HEMATOCRIT: 36.3 % (ref 35.0–47.0)
Hemoglobin: 12.5 g/dL (ref 12.0–16.0)
Lymphocytes Relative: 9 %
Lymphs Abs: 0.9 10*3/uL — ABNORMAL LOW (ref 1.0–3.6)
MCH: 34 pg (ref 26.0–34.0)
MCHC: 34.4 g/dL (ref 32.0–36.0)
MCV: 98.8 fL (ref 80.0–100.0)
Monocytes Absolute: 1 10*3/uL — ABNORMAL HIGH (ref 0.2–0.9)
Monocytes Relative: 10 %
NEUTROS ABS: 7.9 10*3/uL — AB (ref 1.4–6.5)
NEUTROS PCT: 81 %
PLATELETS: 266 10*3/uL (ref 150–440)
RBC: 3.68 MIL/uL — AB (ref 3.80–5.20)
RDW: 13.3 % (ref 11.5–14.5)
WBC: 9.8 10*3/uL (ref 3.6–11.0)

## 2016-02-11 LAB — COMPREHENSIVE METABOLIC PANEL
ALBUMIN: 4.2 g/dL (ref 3.5–5.0)
ALT: 16 U/L (ref 14–54)
AST: 30 U/L (ref 15–41)
Alkaline Phosphatase: 57 U/L (ref 38–126)
Anion gap: 9 (ref 5–15)
BILIRUBIN TOTAL: 0.8 mg/dL (ref 0.3–1.2)
BUN: 7 mg/dL (ref 6–20)
CHLORIDE: 92 mmol/L — AB (ref 101–111)
CO2: 26 mmol/L (ref 22–32)
Calcium: 8.9 mg/dL (ref 8.9–10.3)
Creatinine, Ser: 0.44 mg/dL (ref 0.44–1.00)
GFR calc Af Amer: >60 mL/min (ref >60)
GLUCOSE: 111 mg/dL — AB (ref 65–99)
POTASSIUM: 4.2 mmol/L (ref 3.5–5.1)
Sodium: 127 mmol/L — ABNORMAL LOW (ref 135–145)
Total Protein: 6.8 g/dL (ref 6.5–8.1)

## 2016-02-11 MED ORDER — DOCUSATE SODIUM 100 MG PO CAPS
100.0000 mg | ORAL_CAPSULE | Freq: Two times a day (BID) | ORAL | Status: DC
  Administered 2016-02-11 – 2016-02-12 (×3): 100 mg via ORAL
  Filled 2016-02-11 (×3): qty 1

## 2016-02-11 MED ORDER — ENOXAPARIN SODIUM 40 MG/0.4ML ~~LOC~~ SOLN
40.0000 mg | SUBCUTANEOUS | Status: DC
  Administered 2016-02-11: 40 mg via SUBCUTANEOUS
  Filled 2016-02-11: qty 0.4

## 2016-02-11 MED ORDER — OXYCODONE HCL 5 MG PO TABS
5.0000 mg | ORAL_TABLET | ORAL | Status: DC | PRN
  Administered 2016-02-11 – 2016-02-12 (×5): 5 mg via ORAL
  Filled 2016-02-11 (×5): qty 1

## 2016-02-11 MED ORDER — TRAZODONE HCL 50 MG PO TABS
50.0000 mg | ORAL_TABLET | Freq: Every day | ORAL | Status: DC
Start: 2016-02-11 — End: 2016-02-12
  Administered 2016-02-11: 50 mg via ORAL
  Filled 2016-02-11: qty 1

## 2016-02-11 MED ORDER — DIPHENHYDRAMINE HCL 25 MG PO CAPS: 25.0000 mg | ORAL_CAPSULE | Freq: Every evening | ORAL | Status: DC | PRN

## 2016-02-11 MED ORDER — MORPHINE SULFATE (PF) 4 MG/ML IV SOLN
INTRAVENOUS | Status: AC
  Administered 2016-02-11: 4 mg via INTRAVENOUS
  Filled 2016-02-11: qty 1

## 2016-02-11 MED ORDER — MORPHINE SULFATE (PF) 2 MG/ML IV SOLN
2.0000 mg | INTRAVENOUS | Status: DC | PRN
  Administered 2016-02-11 – 2016-02-12 (×3): 2 mg via INTRAVENOUS
  Filled 2016-02-11 (×3): qty 1

## 2016-02-11 MED ORDER — SODIUM CHLORIDE 0.9 % IV SOLN
INTRAVENOUS | Status: DC
  Administered 2016-02-11 – 2016-02-12 (×2): via INTRAVENOUS

## 2016-02-11 MED ORDER — TIZANIDINE HCL 4 MG PO CAPS
4.0000 mg | ORAL_CAPSULE | Freq: Three times a day (TID) | ORAL | Status: DC | PRN
  Filled 2016-02-11: qty 1

## 2016-02-11 MED ORDER — TIZANIDINE HCL 4 MG PO TABS
4.0000 mg | ORAL_TABLET | Freq: Three times a day (TID) | ORAL | Status: DC | PRN
  Administered 2016-02-11 – 2016-02-12 (×3): 4 mg via ORAL
  Filled 2016-02-11 (×3): qty 1

## 2016-02-11 MED ORDER — ENOXAPARIN SODIUM 40 MG/0.4ML ~~LOC~~ SOLN: 40.0000 mg | SUBCUTANEOUS | Status: DC

## 2016-02-11 MED ORDER — ONDANSETRON HCL 4 MG/2ML IJ SOLN
INTRAMUSCULAR | Status: AC
  Administered 2016-02-11: 4 mg via INTRAVENOUS
  Filled 2016-02-11: qty 2

## 2016-02-11 MED ORDER — POLYETHYLENE GLYCOL 3350 17 G PO PACK: 17.0000 g | PACK | Freq: Every day | ORAL | Status: DC | PRN

## 2016-02-11 MED ORDER — ONDANSETRON HCL 4 MG PO TABS: 4.0000 mg | ORAL_TABLET | Freq: Four times a day (QID) | ORAL | Status: DC | PRN

## 2016-02-11 MED ORDER — RALOXIFENE HCL 60 MG PO TABS
60.0000 mg | ORAL_TABLET | Freq: Every day | ORAL | Status: DC
  Administered 2016-02-12: 60 mg via ORAL
  Filled 2016-02-11: qty 1

## 2016-02-11 MED ORDER — MORPHINE SULFATE (PF) 4 MG/ML IV SOLN
4.0000 mg | Freq: Once | INTRAVENOUS | Status: AC
  Administered 2016-02-11: 4 mg via INTRAVENOUS

## 2016-02-11 MED ORDER — ONDANSETRON HCL 4 MG/2ML IJ SOLN
4.0000 mg | Freq: Four times a day (QID) | INTRAMUSCULAR | Status: DC | PRN
  Administered 2016-02-12: 4 mg via INTRAVENOUS
  Filled 2016-02-11: qty 2

## 2016-02-11 MED ORDER — DIAZEPAM 5 MG/ML IJ SOLN
5.0000 mg | Freq: Once | INTRAMUSCULAR | Status: AC
  Administered 2016-02-11: 5 mg via INTRAVENOUS
  Filled 2016-02-11: qty 2

## 2016-02-11 MED ORDER — MORPHINE SULFATE (PF) 2 MG/ML IV SOLN
2.0000 mg | Freq: Once | INTRAVENOUS | Status: AC
  Administered 2016-02-11: 2 mg via INTRAVENOUS
  Filled 2016-02-11: qty 1

## 2016-02-11 MED ORDER — ACETAMINOPHEN 650 MG RE SUPP: 650.0000 mg | Freq: Four times a day (QID) | RECTAL | Status: DC | PRN

## 2016-02-11 MED ORDER — ACETAMINOPHEN 325 MG PO TABS: 650.0000 mg | ORAL_TABLET | Freq: Four times a day (QID) | ORAL | Status: DC | PRN

## 2016-02-11 MED ORDER — ONDANSETRON HCL 4 MG/2ML IJ SOLN
4.0000 mg | Freq: Once | INTRAMUSCULAR | Status: AC
Start: 2016-02-11 — End: 2016-02-11
  Administered 2016-02-11: 4 mg via INTRAVENOUS

## 2016-02-11 NOTE — ED Notes (Signed)
Report called to Jadie, RN. 

## 2016-02-11 NOTE — Care Management Note (Signed)
Case Management Note  Patient Details  Name: Nicole NicolasJanet Canady MRN: [id number] Date of Birth: 06/04/1946  Subjective/Objective:       69yo Mrs Nicole Butler was admitted with a left femur fracture after a fall at home, and NA=127. WU:[id number]:Osteoporosis, chronic pain from numerous prior fractures and surgeries. PCP=Cheryl Wicker. Dr Martha ClanKrasinski is her orthopedist, also [facility].  Pharmacy[place] in [place]. Lives alone but has a supportive boyfriend. No home oxygen or home health services. Home equipment includes a ramp, a motorized wheelchair, a rolling walker, and a raised toilet seat. She drives herself to appointments. She states no preference of home health providers. Case management will follow for discharge planning.             Action/Plan:   Expected Discharge Date:                  Expected Discharge Plan:     In-House Referral:     Discharge planning Services     Post Acute Care Choice:    Choice offered to:     DME Arranged:    DME Agency:     HH Arranged:    HH Agency:     Status of Service:     Medicare Important Message Given:    Date Medicare IM Given:    Medicare IM give by:    Date Additional Medicare IM Given:    Additional Medicare Important Message give by:     If discussed at Long Length of Stay Meetings, dates discussed:    Additional Comments:  Marsi Turvey A, RN 02/11/2016, 3:23 PM

## 2016-02-11 NOTE — ED Notes (Signed)
Patient states she was not dizzy and says she was just trying to walk when she fell. Patient does appear to be aggitated at this time and demanding pain medication.

## 2016-02-11 NOTE — Care Management Note (Incomplete)
Case Management Note  Patient Details  Name: Kandra NicolasJanet Camire MRN: [id number] Date of Birth: 06/04/1946  Subjective/Objective:                    Action/Plan:   Expected Discharge Date:  12/20/15               Expected Discharge Plan:     In-House Referral:     Discharge planning Services     Post Acute Care Choice:    Choice offered to:     DME Arranged:    DME Agency:     HH Arranged:    HH Agency:     Status of Service:     Medicare Important Message Given:    Date Medicare IM Given:    Medicare IM give by:    Date Additional Medicare IM Given:    Additional Medicare Important Message give by:     If discussed at Long Length of Stay Meetings, dates discussed:    Additional Comments:  Amoura Ransier A, RN 02/11/2016, 3:23 PM

## 2016-02-11 NOTE — Care Management Obs Status (Signed)
MEDICARE OBSERVATION STATUS NOTIFICATION   Patient Details  Name: Nicole NicolasJanet Butler MRN: [id number] Date of Birth: 02/12/1946   Medicare Observation Status Notification Given:  Yes (fracture)    Caren MacadamMichelle Quame Spratlin, RN 02/11/2016, 2:17 PM

## 2016-02-11 NOTE — ED Provider Notes (Signed)
[facility] Emergency Department Provider Note  ____________________________________________  Time seen: Approximately 8:21 AM  I have reviewed the triage vital signs and the nursing notes.   HISTORY  Chief Complaint Knee Pain    HPI Nicole NicolasJanet Butler is a 70 y.o. female , NAD but in severe pain, presents to the emergency department via EMS from home after a fall yesterday. States she has chronic instability due to osteoporosis and multiple fractures in the past. Utilizes a motorized wheelchair and walkers at home to assist in ambulation. States she falls "all the time" due to her physical instability. Notes she fell on to her left side in her home yesterday. Had pain about the left knee but has significantly increased over the last 12 hours. Was unable to ambulate at home, therefore called EMS for transport. States her orthopedist is Dr. Martha ClanKrasinski. She is also on chronic pain [facility]. States she takes Percocet 3 times daily for control for chronic pain. States she took a Percocet around 3 AM this morning without any significant relief of her pain. Patient denies any hip, buttocks, back, neck pain. Has not experienced any numbness, weakness, tingling. Denies chest pain, shortness of breath, visual changes, headaches. Has not had any head injuries, LOC. Denies saddle paresthesias.   Past Medical History  Diagnosis Date  . Osteopenia   . Osteoporosis   . Thyroid disease   . Insomnia   . Hip pain     left  . Arthritis   . Anemia     H/O   . Complication of anesthesia     PT STATES SHE FORGETS TO BREATHE WHEN COMING OUT OF ANESTHESIA    Patient Active Problem List   Diagnosis Date Noted  . Closed displaced intertrochanteric fracture of left femur with routine healing 08/29/2015  . Osteoarthritis 08/29/2015  . Insomnia 08/29/2015  . Dysthymic disorder 08/29/2015  . Hyposmolality 08/29/2015  . IFG (impaired fasting glucose)  08/29/2015  . Anxiety and depression 08/29/2015  . Chronic pain syndrome 08/29/2015  . Vitamin D deficiency 08/29/2015  . Hyponatremia 08/29/2015  . Osteoporosis 08/29/2015  . Macrocytosis 08/29/2015  . Nausea 08/29/2015    Past Surgical History  Procedure Laterality Date  . Total hip arthroplasty Right     x4  . Knee surgery Right     x2  . Foot surgery Left     x3  . Leg surgery Left     x2  . Ankle surgery Left   . Hardware removal Left 12/21/2015    Procedure: HARDWARE REMOVAL;  Surgeon: Juanell FairlyKevin Krasinski, MD;  Location: [facility] ORS;  Service: Orthopedics;  Laterality: Left;  Left HIP     Current Outpatient Rx  Name  Route  Sig  Dispense  Refill  . diclofenac sodium (VOLTAREN) 1 % GEL   Topical   Apply topically 4 (four) times daily.         . ondansetron (ZOFRAN) 4 MG tablet   Oral   Take 1 tablet (4 mg total) by mouth daily as needed for nausea or vomiting.   20 tablet   6   . oxyCODONE (OXY IR/ROXICODONE) 5 MG immediate release tablet   Oral   Take 1 tablet (5 mg total) by mouth every 4 (four) hours as needed for severe pain.   30 tablet   0   . oxyCODONE-acetaminophen (PERCOCET/ROXICET) 5-325 MG tablet   Oral   Take 1 tablet by mouth every 4 (four) hours as  needed for severe pain.         . raloxifene (EVISTA) 60 MG tablet   Oral   Take 1 tablet (60 mg total) by mouth daily.   90 tablet   3   . tiZANidine (ZANAFLEX) 4 MG capsule   Oral   Take 4 mg by mouth 3 (three) times daily as needed for muscle spasms.         . traZODone (DESYREL) 50 MG tablet      take 2 tablets by mouth every evening at bedtime   90 tablet   1     Allergies Butrans and Fosamax  Family History  Problem Relation Age of Onset  . Alzheimer's disease Mother   . Cancer Father     lung    Social History Social History  Substance Use Topics  . Smoking status: Former Smoker -- 1.00 packs/day for 10 years    Types: Cigarettes    Quit date: 12/15/1995  . Smokeless  tobacco: Never Used  . Alcohol Use: 4.8 oz/week    8 Glasses of wine per week     Comment: 2 GLASSES EVERY EVENING     Review of Systems  Constitutional: No fever/chills, fatigue Eyes: No visual changes. Cardiovascular: No chest pain, palpitations. Respiratory: No cough. No shortness of breath. No wheezing.  Gastrointestinal: No abdominal pain.  No nausea, vomiting.   Musculoskeletal: Positive left knee pain. Positive muscle spasms left calf. Negative for back, hip, neck pain.  Skin: Negative for rash, open wounds, skin sores. Neurological: Negative for headaches, focal weakness or numbness. No saddle paresthesias 10-point ROS otherwise negative.  ____________________________________________   PHYSICAL EXAM:  VITAL SIGNS: ED Triage Vitals  Enc Vitals Group     BP --      Pulse --      Resp --      Temp --      Temp src --      SpO2 --      Weight --      Height --      Head Cir --      Peak Flow --      Pain Score --      Pain Loc --      Pain Edu? --      Excl. in GC? --     Constitutional: Alert and oriented. Well appearing and in no acute distress but in severe pain. Eyes: Conjunctivae are normal. PERRL. EOMI without pain.  Head: Atraumatic. Neck: No cervical spine tenderness to palpation. Supple with full range of motion. Hematological/Lymphatic/Immunilogical: No cervical lymphadenopathy. Cardiovascular: Normal rate, regular rhythm. Normal S1 and S2.  Good peripheral circulation. Respiratory: Normal respiratory effort without tachypnea or retractions. Lungs CTAB with breath sounds noted throughout all lung fields. Musculoskeletal: Tenderness to palpation diffusely about the distal femur and left knee. Moderate swelling noted about the superior portion of the knee/distal portion of the left femur. Patient notes increasing pain with movement of the left lower extremity and prefers to keep the knee in a 90 angle. No lower extremity edema.   Neurologic:  Normal  speech and language. No gross focal neurologic deficits are appreciated.  Skin:  Skin is warm, dry and intact. No rash, open wounds, skin sores noted. Psychiatric: Mood and affect are normal. Speech and behavior are normal. Patient exhibits appropriate insight and judgement.   ____________________________________________   LABS (all labs ordered are listed, but only abnormal results are displayed)  Labs Reviewed  CBC WITH DIFFERENTIAL/PLATELET - Abnormal; Notable for the following:    RBC 3.68 (*)    Neutro Abs 7.9 (*)    Lymphs Abs 0.9 (*)    Monocytes Absolute 1.0 (*)    All other components within normal limits  COMPREHENSIVE METABOLIC PANEL   ____________________________________________  EKG  None ____________________________________________  RADIOLOGY I have personally viewed and evaluated these images (plain radiographs) as part of my medical decision making, as well as reviewing the written report by the radiologist.  Dg Knee Complete 4 Views Left  02/11/2016  CLINICAL DATA:  70 year old who fell at home yesterday around 5:30 p.m. and complains of pain involving the distal femur. Initial encounter. EXAM: LEFT KNEE - COMPLETE 4+ VIEW COMPARISON:  No prior knee x-rays. FINDINGS: Acute, mildly impacted fracture involving the distal femoral metaphysis with a component of the fracture that extends into the intercondylar notch. Associated joint hemarthrosis with a fat-fluid level. Severe osseous demineralization. Prior ORIF of a proximal tibial metaphyseal fracture with intramedullary nail incomplete healing. IMPRESSION: Acute traumatic mildly impacted fracture involving the distal femoral metaphysis with a component of the fracture extending to the articular surface in the intercondylar notch. Severe osseous demineralization. Electronically Signed   By: Hulan Saas M.D.   On: 02/11/2016 09:05    ____________________________________________    PROCEDURES  Procedure(s)  performed: None      Medications  morphine 2 MG/ML injection 2 mg (2 mg Intravenous Given 02/11/16 0938)  morphine 4 MG/ML injection 4 mg (4 mg Intravenous Given 02/11/16 1136)  ondansetron (ZOFRAN) injection 4 mg (4 mg Intravenous Given 02/11/16 1135)  diazepam (VALIUM) injection 5 mg (5 mg Intravenous Given 02/11/16 1220)   Patient noted around 10:00 AM that her pain had decreased after being given 2 mg IV morphine. She states she is having some muscle spasms in the left calf area that are intermittent.  ____________________________________________   INITIAL IMPRESSION / ASSESSMENT AND PLAN / ED COURSE  I have spoken with Dr. Martha Clan who is on call for orthopedics as well as the orthopedic primary doctor for Nicole Butler in regards to her assessment and x-rays today. Dr. Martha Clan has reviewed the patient's x-ray and feels that her fracture is nonsurgical at this time. He will follow up with her later next week in his office. He prefers that she be placed in a knee immobilizer and continue her current chronic pain medications. He does suggest that if her pain cannot be controlled with the current chronic medications that she be admitted to medical service for pain control. Has such with the patient and she prefers not to be admitted and she would like to go home today. We have given her morphine via IV and will continue to monitor. I have stressed with the patient that I don't believe her pain will be controlled on outpatient medications and that her best option would be admission but again she continues to refuse such.  ----------------------------------------- 11:24 AM on 02/11/2016 ----------------------------------------- I have consulted with Dr. Weyman Pedro in regards to the patient's care. He has personally assessed the patient and suggested administering 4 mg IV morphine and reassess. Patient's pain level. He agrees that if her pain is controlled that she can be discharged home but that  if her pain cannot be controlled or morphine that she would need to be admitted to the medical service for further pain management. This is been discussed with the patient as well as her female friend who assists her at home at  times with care. He suggests that he would not be able to care for Mrs. Tabar home with her pain being this elevated. He is concerned about her being able to transfer in and out of his vehicle, in and out of bed and as well as being able to take care of her self tibial utilize the restroom. He feels it is best for the patient be admitted for optimal pain control and assistance with ADLs. Patient is in agreement with such and would to start the process of admission to the medical service.   Pertinent labs & imaging results that were available during my care of the patient were reviewed by me and considered in my medical decision making (see chart for details).      ____________________________________________  FINAL CLINICAL IMPRESSION(S) / ED DIAGNOSES  Final diagnoses:  None      NEW MEDICATIONS STARTED DURING THIS VISIT:  New Prescriptions   No medications on file         Hope Pigeon, PA-C 02/11/16 1242  Jene Every, MD 02/11/16 1310

## 2016-02-11 NOTE — H&P (Signed)
[facility] [place] at [facility]   PATIENT NAME: Nicole Butler    MR#:  [id number]  DATE OF BIRTH:  1945-11-21   DATE OF ADMISSION:  02/11/2016  PRIMARY CARE PHYSICIAN: Gabriel Cirri, NP   REQUESTING/REFERRING PHYSICIAN: Cyril Loosen  CHIEF COMPLAINT:   Chief Complaint  Patient presents with  . Knee Pain    HISTORY OF PRESENT ILLNESS:  Terrisa Curfman  is a 70 y.o. female with a known history of osteoporosis presenting after mechanical fall. Patient states has poor balance at baseline suffered a mechanical fall in the kitchen noted immediate pain left knee. Found to have fracture. Immobilized in emergency department. Patient still was experiencing intense pain.  PAST MEDICAL HISTORY:   Past Medical History  Diagnosis Date  . Osteopenia   . Osteoporosis   . Thyroid disease   . Insomnia   . Hip pain     left  . Arthritis   . Anemia     H/O   . Complication of anesthesia     PT STATES SHE FORGETS TO BREATHE WHEN COMING OUT OF ANESTHESIA    PAST SURGICAL HISTORY:   Past Surgical History  Procedure Laterality Date  . Total hip arthroplasty Right     x4  . Knee surgery Right     x2  . Foot surgery Left     x3  . Leg surgery Left     x2  . Ankle surgery Left   . Hardware removal Left 12/21/2015    Procedure: HARDWARE REMOVAL;  Surgeon: Juanell Fairly, MD;  Location: [facility] ORS;  Service: Orthopedics;  Laterality: Left;  Left HIP     SOCIAL HISTORY:   Social History  Substance Use Topics  . Smoking status: Former Smoker -- 1.00 packs/day for 10 years    Types: Cigarettes    Quit date: 12/15/1995  . Smokeless tobacco: Never Used  . Alcohol Use: 4.8 oz/week    8 Glasses of wine per week     Comment: 2 GLASSES EVERY EVENING    FAMILY HISTORY:   Family History  Problem Relation Age of Onset  . Alzheimer's disease Mother   . Cancer Father     lung    DRUG ALLERGIES:   Allergies  Allergen Reactions  . Butrans [Buprenorphine] Hives  .  Fosamax [Alendronate Sodium] Other (See Comments)    dysphagia    REVIEW OF SYSTEMS:  REVIEW OF SYSTEMS:  CONSTITUTIONAL: Denies fevers, chills, fatigue, weakness.  EYES: Denies blurred vision, double vision, or eye pain.  EARS, NOSE, THROAT: Denies tinnitus, ear pain, hearing loss.  RESPIRATORY: denies cough, shortness of breath, wheezing  CARDIOVASCULAR: Denies chest pain, palpitations, edema.  GASTROINTESTINAL: Denies nausea, vomiting, diarrhea, abdominal pain.  GENITOURINARY: Denies dysuria, hematuria.  ENDOCRINE: Denies nocturia or thyroid problems. HEMATOLOGIC AND LYMPHATIC: Denies easy bruising or bleeding.  SKIN: Denies rash or lesions.  MUSCULOSKELETAL: Denies pain in neck, back, shoulder, hips, or further arthritic symptoms. Positive knee pain NEUROLOGIC: Denies paralysis, paresthesias.  PSYCHIATRIC: Denies anxiety or depressive symptoms. Otherwise full review of systems performed by me is negative.   MEDICATIONS AT HOME:   Prior to Admission medications   Medication Sig Start Date End Date Taking? Authorizing Provider  diclofenac sodium (VOLTAREN) 1 % GEL Apply topically 4 (four) times daily.    Historical Provider, MD  ondansetron (ZOFRAN) 4 MG tablet Take 1 tablet (4 mg total) by mouth daily as needed for nausea or vomiting. 10/02/15   Gabriel Cirri, NP  oxyCODONE (OXY IR/ROXICODONE) 5 MG immediate release tablet Take 1 tablet (5 mg total) by mouth every 4 (four) hours as needed for severe pain. 12/21/15   Juanell FairlyKevin Krasinski, MD  oxyCODONE-acetaminophen (PERCOCET/ROXICET) 5-325 MG tablet Take 1 tablet by mouth every 4 (four) hours as needed for severe pain.    Historical Provider, MD  raloxifene (EVISTA) 60 MG tablet Take 1 tablet (60 mg total) by mouth daily. 10/02/15   Gabriel Cirriheryl Wicker, NP  tiZANidine (ZANAFLEX) 4 MG capsule Take 4 mg by mouth 3 (three) times daily as needed for muscle spasms.    Historical Provider, MD  traZODone (DESYREL) 50 MG tablet take 2 tablets by mouth  every evening at bedtime 02/06/16   Gabriel Cirriheryl Wicker, NP      VITAL SIGNS:  Blood pressure 111/77, pulse 110, temperature 97.6 F (36.4 C), temperature source Oral, resp. rate 20, height 5\' 7"  (1.702 m), weight 49.896 kg (110 lb), SpO2 96 %.  PHYSICAL EXAMINATION:  VITAL SIGNS: Filed Vitals:   02/11/16 0821 02/11/16 1049  BP: 130/78 111/77  Pulse: 85 110  Temp: 97.6 F (36.4 C)   Resp: 18 20   GENERAL:69 y.o.female currently in no acute distress.  HEAD: Normocephalic, atraumatic.  EYES: Pupils equal, round, reactive to light. Extraocular muscles intact. No scleral icterus.  MOUTH: Moist mucosal membrane. Dentition intact. No abscess noted.  EAR, NOSE, THROAT: Clear without exudates. No external lesions.  NECK: Supple. No thyromegaly. No nodules. No JVD.  PULMONARY: Clear to ascultation, without wheeze rails or rhonci. No use of accessory muscles, Good respiratory effort. good air entry bilaterally CHEST: Nontender to palpation.  CARDIOVASCULAR: S1 and S2. Regular rate and rhythm. No murmurs, rubs, or gallops. No edema. Pedal pulses 2+ bilaterally.  GASTROINTESTINAL: Soft, nontender, nondistended. No masses. Positive bowel sounds. No hepatosplenomegaly.  MUSCULOSKELETAL: No swelling, clubbing, or edema. Left lower extremity immobilized  NEUROLOGIC: Cranial nerves II through XII are intact. No gross focal neurological deficits. Sensation intact. Reflexes intact.  SKIN: No ulceration, lesions, rashes, or cyanosis. Skin warm and dry. Turgor intact.  PSYCHIATRIC: Mood, affect within normal limits. The patient is awake, alert and oriented x 3. Insight, judgment intact.    LABORATORY PANEL:   CBC  Recent Labs Lab 02/11/16 1130  WBC 9.8  HGB 12.5  HCT 36.3  PLT 266   ------------------------------------------------------------------------------------------------------------------  Chemistries   Recent Labs Lab 02/11/16 1130  NA 127*  K 4.2  CL 92*  CO2 26  GLUCOSE 111*    BUN 7  CREATININE 0.44  CALCIUM 8.9  AST 30  ALT 16  ALKPHOS 57  BILITOT 0.8   ------------------------------------------------------------------------------------------------------------------  Cardiac Enzymes No results for input(s): TROPONINI in the last 168 hours. ------------------------------------------------------------------------------------------------------------------  RADIOLOGY:  Dg Knee Complete 4 Views Left  02/11/2016  CLINICAL DATA:  70 year old who fell at home yesterday around 5:30 p.m. and complains of pain involving the distal femur. Initial encounter. EXAM: LEFT KNEE - COMPLETE 4+ VIEW COMPARISON:  No prior knee x-rays. FINDINGS: Acute, mildly impacted fracture involving the distal femoral metaphysis with a component of the fracture that extends into the intercondylar notch. Associated joint hemarthrosis with a fat-fluid level. Severe osseous demineralization. Prior ORIF of a proximal tibial metaphyseal fracture with intramedullary nail incomplete healing. IMPRESSION: Acute traumatic mildly impacted fracture involving the distal femoral metaphysis with a component of the fracture extending to the articular surface in the intercondylar notch. Severe osseous demineralization. Electronically Signed   By: Hulan Saashomas  Lawrence M.D.   On: 02/11/2016 09:05  EKG:   Orders placed or performed during the hospital encounter of 12/21/15  . EKG 12-Lead  . EKG 12-Lead    IMPRESSION AND PLAN:   70 year old Caucasian female history of osteoporosis suffering multiple fractures in the past presenting after mechanical fall and subsequent left knee fracture  1. Left femur fracture: mildly impacted fracture involving the distal femoral metaphysis , pain control bowel regimen and physical therapy evaluation consult orthopedics-already aware of the patient 2. Hyponatremia: IV fluid hydration follow sodium level III. Venous thrombus embolism prophylactic: Lovenox  All the records are  reviewed and case discussed with ED provider. Management plans discussed with the patient, family and they are in agreement.  CODE STATUS: Full  TOTAL TIME TAKING CARE OF THIS PATIENT: 33 minutes.    Tavio Biegel,  Mardi Mainland.D on 02/11/2016 at 1:50 PM  Between 7am to 6pm - Pager - [phone]  After 6pm: House Pager: - [phone]  [facility] [place] [facility]  Office  [phone]  CC: Primary care physician; Gabriel Cirri, NP

## 2016-02-11 NOTE — ED Notes (Signed)
Patient arrived via EMS from home.  Patient reports falling yesterday around 520pm and states she thought she just sprained her knee, but pain worse today.  Patient states she hasn't been able to get around, but states she slept on the couch as much as she could last night.  Patient arrived after incontinent episode.  Patient has a history of osteoporosis. Patient sees Dr. Martha ClanKrasinski.

## 2016-02-11 NOTE — ED Notes (Signed)
XR in room 

## 2016-02-11 NOTE — Care Management Note (Signed)
Case Management Note  Patient Details  Name: Nicole Butler [place] MRN: [id number] Date of Birth: 10/23/1946  Subjective/Objective:       69yo Ms Nicole Butler [place] was admitted 02/11/16 after a fall at home which resulted in a left femur fracture.              Action/Plan:   Expected Discharge Date:  12/20/15               Expected Discharge Plan:     In-House Referral:     Discharge planning Services     Post Acute Care Choice:    Choice offered to:     DME Arranged:    DME Agency:     HH Arranged:    HH Agency:     Status of Service:     Medicare Important Message Given:    Date Medicare IM Given:    Medicare IM give by:    Date Additional Medicare IM Given:    Additional Medicare Important Message give by:     If discussed at Long Length of Stay Meetings, dates discussed:    Additional Comments:  Zachari Alberta A, RN 02/11/2016, 3:21 PM

## 2016-02-11 NOTE — ED Notes (Signed)
Patient states she took Percocet at 0300 this morning.

## 2016-02-12 LAB — BASIC METABOLIC PANEL
ANION GAP: 8 (ref 5–15)
BUN: 9 mg/dL (ref 6–20)
CHLORIDE: 96 mmol/L — AB (ref 101–111)
CO2: 25 mmol/L (ref 22–32)
Calcium: 8.3 mg/dL — ABNORMAL LOW (ref 8.9–10.3)
Creatinine, Ser: 0.41 mg/dL — ABNORMAL LOW (ref 0.44–1.00)
GFR calc Af Amer: >60 mL/min (ref >60)
GLUCOSE: 98 mg/dL (ref 65–99)
POTASSIUM: 4 mmol/L (ref 3.5–5.1)
Sodium: 129 mmol/L — ABNORMAL LOW (ref 135–145)

## 2016-02-12 LAB — CBC
HEMATOCRIT: 33.7 % — AB (ref 35.0–47.0)
HEMOGLOBIN: 11.4 g/dL — AB (ref 12.0–16.0)
MCH: 34.3 pg — ABNORMAL HIGH (ref 26.0–34.0)
MCHC: 33.8 g/dL (ref 32.0–36.0)
MCV: 101.4 fL — AB (ref 80.0–100.0)
PLATELETS: 223 10*3/uL (ref 150–440)
RBC: 3.33 MIL/uL — ABNORMAL LOW (ref 3.80–5.20)
RDW: 12.9 % (ref 11.5–14.5)
WBC: 8.2 10*3/uL (ref 3.6–11.0)

## 2016-02-12 NOTE — Progress Notes (Signed)
Per RN patient requires EMS for transport home. RN confirmed address. Clinical Child psychotherapistocial Worker (CSW) completed EMS form.   Jetta LoutBailey Morgan, LCSW [phone]

## 2016-02-12 NOTE — Discharge Instructions (Signed)
Regular diet. Activity as tolerated. HHPT. Fall precaution. She is not to bear weight on the left lower extremity for 2-3 months.

## 2016-02-12 NOTE — Plan of Care (Signed)
Problem: Acute Rehab PT Goals(only PT should resolve) Goal: Patient Will Transfer Sit To/From Stand Pt will transfer sit to/from-stand with RW and LLE NWB at ModI without loss-of-balance to demonstrate good safety awareness for independent mobility in home.     Goal: Pt Will Ambulate Pt will ambulate with RW and LLE NWB at Supervision for a distance greater than 9150ft to demonstrate the ability to perform safe household distance ambulation at discharge.

## 2016-02-12 NOTE — Discharge Summary (Addendum)
[facility] [place] at [facility]   PATIENT NAME: Nicole Butler    MR#:  [id number]  DATE OF BIRTH:  07/12/1946  DATE OF ADMISSION:  02/11/2016 ADMITTING PHYSICIAN: Wyatt Haste, MD  DATE OF DISCHARGE: 02/12/2016 PRIMARY CARE PHYSICIAN: Gabriel Cirri, NP    ADMISSION DIAGNOSIS:  Femoral distal fracture, left, closed, initial encounter [S72.402A]   DISCHARGE DIAGNOSIS:  Left nondisplaced distal femur fracture  Hyponatremia SECONDARY DIAGNOSIS:   Past Medical History  Diagnosis Date  . Osteopenia   . Osteoporosis   . Thyroid disease   . Insomnia   . Hip pain     left  . Arthritis   . Anemia     H/O   . Complication of anesthesia     PT STATES SHE FORGETS TO BREATHE WHEN COMING OUT OF ANESTHESIA    HOSPITAL COURSE:   70 year old Caucasian female history of osteoporosis suffering multiple fractures in the past presenting after mechanical fall and subsequent left knee fracture  1. Left nondisplaced distal femur fracture: mildly impacted fracture involving the distal femoral metaphysis. Per Dr. Martha Clan, she is to be nonweightbearing at all times. She will keep the left elevated at home and will follow up with me in 1 week for reevaluation and x-ray. Pain control.  2. Hyponatremia: improving with NS IV and follow sodium level as outpatient.  The patient refused rehab placement. Per PT, she needs HHPT, but she refused. DISCHARGE CONDITIONS:   Stable, discharge to home today.  CONSULTS OBTAINED:  Treatment Team:  Wyatt Haste, MD Juanell Fairly, MD  DRUG ALLERGIES:   Allergies  Allergen Reactions  . Butrans [Buprenorphine] Hives  . Fosamax [Alendronate Sodium] Other (See Comments)    dysphagia    DISCHARGE MEDICATIONS:   Current Discharge Medication List    CONTINUE these medications which have NOT CHANGED   Details  diclofenac sodium (VOLTAREN) 1 % GEL Apply topically 4 (four) times daily.    ondansetron (ZOFRAN) 4 MG tablet  Take 1 tablet (4 mg total) by mouth daily as needed for nausea or vomiting. Qty: 20 tablet, Refills: 6    raloxifene (EVISTA) 60 MG tablet Take 1 tablet (60 mg total) by mouth daily. Qty: 90 tablet, Refills: 3    tiZANidine (ZANAFLEX) 4 MG capsule Take 4 mg by mouth 3 (three) times daily as needed for muscle spasms.    traZODone (DESYREL) 50 MG tablet take 2 tablets by mouth every evening at bedtime Qty: 90 tablet, Refills: 1    oxyCODONE (OXY IR/ROXICODONE) 5 MG immediate release tablet Take 1 tablet (5 mg total) by mouth every 4 (four) hours as needed for severe pain. Qty: 30 tablet, Refills: 0    oxyCODONE-acetaminophen (PERCOCET/ROXICET) 5-325 MG tablet Take 1 tablet by mouth every 4 (four) hours as needed for severe pain.         DISCHARGE INSTRUCTIONS:    If you experience worsening of your admission symptoms, develop shortness of breath, life threatening emergency, suicidal or homicidal thoughts you must seek medical attention immediately by calling 911 or calling your MD immediately  if symptoms less severe.  You Must read complete instructions/literature along with all the possible adverse reactions/side effects for all the Medicines you take and that have been prescribed to you. Take any new Medicines after you have completely understood and accept all the possible adverse reactions/side effects.   Please note  You were cared for by a hospitalist during your hospital stay. If you have any  questions about your discharge medications or the care you received while you were in the hospital after you are discharged, you can call the unit and asked to speak with the hospitalist on call if the hospitalist that took care of you is not available. Once you are discharged, your primary care physician will handle any further medical issues. Please note that NO REFILLS for any discharge medications will be authorized once you are discharged, as it is imperative that you return to your  primary care physician (or establish a relationship with a primary care physician if you do not have one) for your aftercare needs so that they can reassess your need for medications and monitor your lab values.    Today   SUBJECTIVE    Left leg cramp.  VITAL SIGNS:  Blood pressure 135/66, pulse 79, temperature 98.4 F (36.9 C), temperature source Oral, resp. rate 20, height 5\' 7"  (1.702 m), weight 49.896 kg (110 lb), SpO2 95 %.  I/O:   Intake/Output Summary (Last 24 hours) at 02/12/16 1554 Last data filed at 02/12/16 0900  Gross per 24 hour  Intake    915 ml  Output   1000 ml  Net    -85 ml    PHYSICAL EXAMINATION:  GENERAL:  70 y.o.-year-old patient lying in the bed with no acute distress.  EYES: Pupils equal, round, reactive to light and accommodation. No scleral icterus. Extraocular muscles intact.  HEENT: Head atraumatic, normocephalic. Oropharynx and nasopharynx clear.  NECK:  Supple, no jugular venous distention. No thyroid enlargement, no tenderness.  LUNGS: Normal breath sounds bilaterally, no wheezing, rales,rhonchi or crepitation. No use of accessory muscles of respiration.  CARDIOVASCULAR: S1, S2 normal. No murmurs, rubs, or gallops.  ABDOMEN: Soft, non-tender, non-distended. Bowel sounds present. No organomegaly or mass.  EXTREMITIES: No pedal edema, cyanosis, or clubbing.  NEUROLOGIC: Cranial nerves II through XII are intact. Muscle strength 5/5 in all extremities except left leg. Sensation intact. Gait not checked.  PSYCHIATRIC: The patient is alert and oriented x 3.  SKIN: No obvious rash, lesion, or ulcer.   DATA REVIEW:   CBC  Recent Labs Lab 02/12/16 0325  WBC 8.2  HGB 11.4*  HCT 33.7*  PLT 223    Chemistries   Recent Labs Lab 02/11/16 1130 02/12/16 0325  NA 127* 129*  K 4.2 4.0  CL 92* 96*  CO2 26 25  GLUCOSE 111* 98  BUN 7 9  CREATININE 0.44 0.41*  CALCIUM 8.9 8.3*  AST 30  --   ALT 16  --   ALKPHOS 57  --   BILITOT 0.8  --      Cardiac Enzymes No results for input(s): TROPONINI in the last 168 hours.  Microbiology Results  No results found for this or any previous visit.  RADIOLOGY:  Dg Knee Complete 4 Views Left  02/11/2016  CLINICAL DATA:  70 year old who fell at home yesterday around 5:30 p.m. and complains of pain involving the distal femur. Initial encounter. EXAM: LEFT KNEE - COMPLETE 4+ VIEW COMPARISON:  No prior knee x-rays. FINDINGS: Acute, mildly impacted fracture involving the distal femoral metaphysis with a component of the fracture that extends into the intercondylar notch. Associated joint hemarthrosis with a fat-fluid level. Severe osseous demineralization. Prior ORIF of a proximal tibial metaphyseal fracture with intramedullary nail incomplete healing. IMPRESSION: Acute traumatic mildly impacted fracture involving the distal femoral metaphysis with a component of the fracture extending to the articular surface in the intercondylar notch. Severe osseous demineralization.  Electronically Signed   By: Hulan Saas M.D.   On: 02/11/2016 09:05        Management plans discussed with the patient, family and they are in agreement.  CODE STATUS:     Code Status Orders        Start     Ordered   02/11/16 1321  Full code   Continuous     02/11/16 1320    Code Status History    Date Active Date Inactive Code Status Order ID Comments User Context   This patient has a current code status but no historical code status.    Advance Directive Documentation        Most Recent Value   Type of Advance Directive  Healthcare Power of Attorney   Pre-existing out of facility DNR order (yellow form or pink MOST form)     "MOST" Form in Place?        TOTAL TIME TAKING CARE OF THIS PATIENT: 28 minutes.    Shaune Pollack M.D on 02/12/2016 at 3:54 PM  Between 7am to 6pm - Pager - [phone]  After 6pm go to [email or web address] - password [email or web address] [facility]  [facility]  Office   [phone]  CC: Primary care physician; Gabriel Cirri, NP

## 2016-02-12 NOTE — Consult Note (Signed)
ORTHOPAEDIC CONSULTATION  REQUESTING PHYSICIAN: Shaune PollackQing Chen, MD  Chief Complaint: Left thigh pain status post fall  HPI: Nicole Butler is a 70 y.o. female who complains of  pain in the left thigh after following on her way from her dining room table to her living room to sit down. Patient landed on her left knee. She denies losing consciousness or other injuries. I treated the patient for previous intertrochanteric hip fracture. She had prominent hardware which was removed by me in November 2016. She'll be doing well postop until this most recent fall. Patient has a history of multiple lower show any fractures.  Past Medical History  Diagnosis Date  . Osteopenia   . Osteoporosis   . Thyroid disease   . Insomnia   . Hip pain     left  . Arthritis   . Anemia     H/O   . Complication of anesthesia     PT STATES SHE FORGETS TO BREATHE WHEN COMING OUT OF ANESTHESIA   Past Surgical History  Procedure Laterality Date  . Total hip arthroplasty Right     x4  . Knee surgery Right     x2  . Foot surgery Left     x3  . Leg surgery Left     x2  . Ankle surgery Left   . Hardware removal Left 12/21/2015    Procedure: HARDWARE REMOVAL;  Surgeon: Juanell FairlyKevin Jordane Hisle, MD;  Location: [facility] ORS;  Service: Orthopedics;  Laterality: Left;  Left HIP    Social History   Social History  . Marital Status: Single    Spouse Name: N/A  . Number of Children: N/A  . Years of Education: N/A   Social History Main Topics  . Smoking status: Former Smoker -- 1.00 packs/day for 10 years    Types: Cigarettes    Quit date: 12/15/1995  . Smokeless tobacco: Never Used  . Alcohol Use: 4.8 oz/week    8 Glasses of wine per week     Comment: 2 GLASSES EVERY EVENING  . Drug Use: No  . Sexual Activity: Not Asked   Other Topics Concern  . None   Social History Narrative   Family History  Problem Relation Age of Onset  . Alzheimer's disease Mother   . Cancer Father     lung   Allergies  Allergen Reactions   . Butrans [Buprenorphine] Hives  . Fosamax [Alendronate Sodium] Other (See Comments)    dysphagia   Prior to Admission medications   Medication Sig Start Date End Date Taking? Authorizing Provider  diclofenac sodium (VOLTAREN) 1 % GEL Apply topically 4 (four) times daily.   Yes Historical Provider, MD  ondansetron (ZOFRAN) 4 MG tablet Take 1 tablet (4 mg total) by mouth daily as needed for nausea or vomiting. 10/02/15  Yes Gabriel Cirriheryl Wicker, NP  raloxifene (EVISTA) 60 MG tablet Take 1 tablet (60 mg total) by mouth daily. 10/02/15  Yes Gabriel Cirriheryl Wicker, NP  tiZANidine (ZANAFLEX) 4 MG capsule Take 4 mg by mouth 3 (three) times daily as needed for muscle spasms.   Yes Historical Provider, MD  traZODone (DESYREL) 50 MG tablet take 2 tablets by mouth every evening at bedtime 02/06/16  Yes Gabriel Cirriheryl Wicker, NP  oxyCODONE (OXY IR/ROXICODONE) 5 MG immediate release tablet Take 1 tablet (5 mg total) by mouth every 4 (four) hours as needed for severe pain. 12/21/15   Juanell FairlyKevin Lashanda Storlie, MD  oxyCODONE-acetaminophen (PERCOCET/ROXICET) 5-325 MG tablet Take 1 tablet by mouth every 4 (four) hours  as needed for severe pain.    Historical Provider, MD   Dg Knee Complete 4 Views Left  02/11/2016  CLINICAL DATA:  70 year old who fell at home yesterday around 5:30 p.m. and complains of pain involving the distal femur. Initial encounter. EXAM: LEFT KNEE - COMPLETE 4+ VIEW COMPARISON:  No prior knee x-rays. FINDINGS: Acute, mildly impacted fracture involving the distal femoral metaphysis with a component of the fracture that extends into the intercondylar notch. Associated joint hemarthrosis with a fat-fluid level. Severe osseous demineralization. Prior ORIF of a proximal tibial metaphyseal fracture with intramedullary nail incomplete healing. IMPRESSION: Acute traumatic mildly impacted fracture involving the distal femoral metaphysis with a component of the fracture extending to the articular surface in the intercondylar notch. Severe  osseous demineralization. Electronically Signed   By: Hulan Saas M.D.   On: 02/11/2016 09:05    Positive ROS: All other systems have been reviewed and were otherwise negative with the exception of those mentioned in the HPI and as above.  Physical Exam: General: Alert, no acute distress  MUSCULOSKELETAL: Left lower extremity:  The patient's skin is intact. She has no significant swelling.  Compartments are soft and compressible. Distally she has palpable pedal pulses, intact sensation to light touch and intact motor function. Patient's right lower extremity demonstrates no erythema or ecchymosis and swelling. Her compartments are soft and compressible. She is neurovascularly intact with intact motor function in the right side as well.  Assessment: Left nondisplaced distal femur fracture  Plan: I reviewed the patient's x-rays. Patient has a nondisplaced fracture involving the distal metaphysis of the femur.  Due to the nondisplaced nature of this injury, I recommending nonoperative treatment. She will continue her use of her knee immobilizer. She is not to bear weight on the left lower extremity for 2-3 months. She has a motorized wheelchair at home which she will use transportation. She understands that she is to be nonweightbearing at all times. She will keep the left elevated at home and will follow up with me in 1 week for reevaluation and x-ray. If the fracture displaces, she may require surgical intervention/   The patient understood and agreed with this plan.    Juanell Fairly, MD    02/12/2016 2:11 PM

## 2016-02-12 NOTE — Care Management (Signed)
Patient refusing home health PT. Patient refused EMS. She states she will try to arrange transportation home. She refused RNCM assistance.

## 2016-02-12 NOTE — Evaluation (Signed)
Physical Therapy Evaluation Patient Details Name: Nicole Butler MRN: [id number] DOB: 06/01/1946 Today's Date: 02/12/2016   History of Present Illness  Nicole NicolasJanet Butler is a 70yo white female who comes to [facility] on 4/9 p fall in kitchen at home wherein she was later found to have sustained a hip fracture. Pt sustained a L hip fracture in Dec 2016, with hip pinning, recent hardward removal (~1MA). Pt has used a motorized scooter for most mobilty since Dec and attests to frequent falls at home. PMH: R knee surgery x2, hip replacement, pelvic fracture. 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     Clinical Impression  At evaluation, pt is received semirecumbent in bed upon entry, family/caregiver present. The pt is awake and agreeable to participate. No acute distress noted at this time, however with later attempted PROM of LLE, sudden onset of muscle cramping is unrelenting and not tolerated well. The pt is alert and oriented x3, pleasant, conversational, but provides resistance to most attempted education regarding how PT will benefit her safety and function at home. Pt reports a rich falls history. Baseline function is very low level as she has been recovering from a fracture on the LLE since Dec 2016 and has elected to remain non-ambulatory as much as possible. Now that she has recently been attempting more AMB, falls have become a regular occurrence.Pt reports she has received HHPT in the past, however it sounds as thought the transition from HHPT to OPPT has been negligible and most time rehab services halted after achievement of household distance mobility.     Patient presenting with impairment of strength, range of motion, balance, and activity tolerance, limiting ability to perform ADL and mobility tasks at  baseline level of function. Patient will benefit from skilled intervention to address the above impairments and limitations, in order to restore to prior level of function, improve patient  safety upon discharge, and to decrease falls risk.       Follow Up Recommendations Outpatient PT;Home health PT (HHPT, with evenual transition to OPPT immediately after. Pt has access to the community at baseline. )    Equipment Recommendations  None recommended by PT    Recommendations for Other Services       Precautions / Restrictions Precautions Precautions: None Precaution Comments: no score available.  Restrictions Weight Bearing Restrictions: Yes LLE Weight Bearing: Non weight bearing Other Position/Activity Restrictions: received per verbal from Dr. Imogene Burnhen       Mobility  Bed Mobility               General bed mobility comments: attempted, but resultant in painful muscle cramps on LLE.   Transfers                    Ambulation/Gait                Stairs            Wheelchair Mobility    Modified Rankin (Stroke Patients Only)       Balance                                             Pertinent Vitals/Pain Pain Assessment: Faces Faces Pain Scale: Hurts whole lot Pain Location: severe pain from cmuscle cramps with attempted PROM of LLE.  Pain Descriptors / Indicators: Cramping Pain Intervention(s): Limited activity within patient's tolerance;Repositioned  Home Living Family/patient expects to be discharged to:: Private residence   Available Help at Discharge: Friend(s) (boyfriend ) Type of Home: House Home Access:  (fully handicapped accessible. )     Home Layout: One level Home Equipment: Walker - 2 wheels;Walker - 4 wheels;Electric scooter Additional Comments: has been using motorized scooter since Dec 2016, fits everywhere in home.    Prior Function Level of Independence: Needs assistance   Gait / Transfers Assistance Needed: very minimal, mostly pivots and room-to-room distances  ADL's / Homemaking Assistance Needed: some assistance from boyfriend.         Hand Dominance         Extremity/Trunk Assessment                         Communication   Communication: No difficulties  Cognition Arousal/Alertness: Awake/alert Behavior During Therapy: WFL for tasks assessed/performed Overall Cognitive Status: Within Functional Limits for tasks assessed                      General Comments      Exercises        Assessment/Plan    PT Assessment Patient needs continued PT services  PT Diagnosis Difficulty walking;Generalized weakness   PT Problem List Decreased strength;Decreased safety awareness;Decreased range of motion;Decreased activity tolerance;Decreased mobility;Pain  PT Treatment Interventions DME instruction;Gait training;Functional mobility training;Therapeutic activities;Therapeutic exercise;Balance training;Patient/family education   PT Goals (Current goals can be found in the Care Plan section) Acute Rehab PT Goals Patient Stated Goal: Go home and take care of her own rehab needs solo.  PT Goal Formulation: With patient Time For Goal Achievement: 02/26/16 Potential to Achieve Goals: Poor    Frequency 7X/week   Barriers to discharge Decreased caregiver support      Co-evaluation               End of Session Equipment Utilized During Treatment: Left knee immobilizer Activity Tolerance: Patient limited by pain Patient left: in bed;with call bell/phone within reach Nurse Communication: Mobility status;Other (comment)    Functional Assessment Tool Used: Clinical Judgment  Functional Limitation: Mobility: Walking and moving around;Changing and maintaining body position Mobility: Walking and Moving Around Current Status [phone]): At least 60 percent but less than 80 percent impaired, limited or restricted Mobility: Walking and Moving Around Goal Status [phone]): At least 60 percent but less than 80 percent impaired, limited or restricted    Time: 1101-1115 PT Time Calculation (min) (ACUTE ONLY): 14 min   Charges:   PT  Evaluation $PT Eval Moderate Complexity: 1 Procedure     PT G Codes:   PT G-Codes **NOT FOR INPATIENT CLASS** Functional Assessment Tool Used: Clinical Judgment  Functional Limitation: Mobility: Walking and moving around;Changing and maintaining body position Mobility: Walking and Moving Around Current Status [phone]): At least 60 percent but less than 80 percent impaired, limited or restricted Mobility: Walking and Moving Around Goal Status [phone]): At least 60 percent but less than 80 percent impaired, limited or restricted   12:05 PM, 02/12/2016 Rosamaria Lints, PT, DPT PRN Physical Therapist - [facility] License # [id number] [phone] (mobile)

## 2016-02-12 NOTE — Care Management (Signed)
Patient is contacting boyfriend for transportation home. She is still refusing EMS and home health. She became agitated when I asked her again about them both. I spoke with Dr. Martha ClanKrasinski and he agrees to ordering HHPT after discharge as patient is followed by him and has an upcoming appointment- maybe then patient will agrees to HHPT. No further RNCM needs. Case closed.

## 2016-02-26 ENCOUNTER — Inpatient Hospital Stay: Payer: Medicare Other | Admitting: Unknown Physician Specialty

## 2016-04-03 ENCOUNTER — Encounter: Payer: Self-pay | Admitting: Orthopedic Surgery

## 2016-05-06 ENCOUNTER — Other Ambulatory Visit: Payer: Self-pay | Admitting: Unknown Physician Specialty

## 2016-08-02 ENCOUNTER — Encounter: Payer: Self-pay | Admitting: Unknown Physician Specialty

## 2016-08-02 ENCOUNTER — Ambulatory Visit (INDEPENDENT_AMBULATORY_CARE_PROVIDER_SITE_OTHER): Payer: Medicare Other | Admitting: Unknown Physician Specialty

## 2016-08-02 DIAGNOSIS — G4700 Insomnia, unspecified: Secondary | ICD-10-CM

## 2016-08-02 DIAGNOSIS — F418 Other specified anxiety disorders: Secondary | ICD-10-CM

## 2016-08-02 DIAGNOSIS — G894 Chronic pain syndrome: Secondary | ICD-10-CM

## 2016-08-02 DIAGNOSIS — I10 Essential (primary) hypertension: Secondary | ICD-10-CM

## 2016-08-02 DIAGNOSIS — F419 Anxiety disorder, unspecified: Principal | ICD-10-CM

## 2016-08-02 DIAGNOSIS — F32A Depression, unspecified: Secondary | ICD-10-CM

## 2016-08-02 DIAGNOSIS — F329 Major depressive disorder, single episode, unspecified: Secondary | ICD-10-CM

## 2016-08-02 MED ORDER — TRAZODONE HCL 50 MG PO TABS: 100.0000 mg | ORAL_TABLET | Freq: Every day | ORAL | 1 refills | Status: DC

## 2016-08-02 MED ORDER — RALOXIFENE HCL 60 MG PO TABS: 60.0000 mg | ORAL_TABLET | Freq: Every day | ORAL | 3 refills | Status: DC

## 2016-08-02 MED ORDER — TIZANIDINE HCL 4 MG PO CAPS: 4.0000 mg | ORAL_CAPSULE | Freq: Three times a day (TID) | ORAL | 12 refills | Status: DC | PRN

## 2016-08-02 MED ORDER — ONDANSETRON HCL 4 MG PO TABS: 4.0000 mg | ORAL_TABLET | Freq: Every day | ORAL | 6 refills | Status: DC | PRN

## 2016-08-02 NOTE — Assessment & Plan Note (Signed)
Still a problem

## 2016-08-02 NOTE — Assessment & Plan Note (Signed)
Elevated in office.  White coat.  Continue to monitor at home

## 2016-08-02 NOTE — Assessment & Plan Note (Signed)
Stable, continue present medications.   

## 2016-08-02 NOTE — Patient Instructions (Addendum)
Try Magnesium Citrate for sleep, leg cramps and constipation.  I take something called Natural Calm  Try tart cherry juice concentrate.  2 tbsps an hour before bed

## 2016-08-02 NOTE — Progress Notes (Signed)
BP (!) 160/96 (BP Location: Left Arm, Patient Position: Sitting, Cuff Size: Normal)   Pulse 85   Temp 99.1 F (37.3 C)   SpO2 96%    Subjective:    Patient ID: Nicole Butler, female    DOB: 07/14/1946, 70 y.o.   MRN: [id number]  HPI: Nicole NicolasJanet Stouffer is a 70 y.o. female  Chief Complaint  Patient presents with  . Pain  . Hypertension   Hypertension States she has a cuff at home and runs in 130's but high in the office.  It was also high last visit.    No problems or lightheadedness No chest pain with exertion or shortness of breath No Edema  Chronic pain She has had a rough year.  Just went to the pain clinic and got a refill of Oxycodone.  She is also taking Voltaren.  She needs a refill Tizanidine and Ondansetron  Osteoporosis She is seeing Endocrine.  She needs a refill of Raloxefene    Depression Doing well considering her year.  Needs refill of Trazadone [facility] 08/02/2016 08/02/2016  Decreased Interest 1 1  Down, Depressed, Hopeless 1 1  PHQ - 2 Score 2 2  Altered sleeping 3 -  Tired, decreased energy 3 -  Change in appetite 1 -  Feeling bad or failure about yourself  2 -  Trouble concentrating 1 -  Moving slowly or fidgety/restless 1 -  Suicidal thoughts 0 -  PHQ-9 Score 13 -    Relevant past medical, surgical, family and social history reviewed and updated as indicated. Interim medical history since our last visit reviewed. Allergies and medications reviewed and updated.  Review of Systems  Per HPI unless specifically indicated above     Objective:    BP (!) 160/96 (BP Location: Left Arm, Patient Position: Sitting, Cuff Size: Normal)   Pulse 85   Temp 99.1 F (37.3 C)   SpO2 96%   Wt Readings from Last 3 Encounters:  02/11/16 110 lb (49.9 kg)  12/15/15 108 lb (49 kg)    Physical Exam  Constitutional: She is oriented to person, place, and time. She appears well-developed and well-nourished. No distress.  HENT:  Head: Normocephalic  and atraumatic.  Eyes: Conjunctivae and lids are normal. Right eye exhibits no discharge. Left eye exhibits no discharge. No scleral icterus.  Neck: Normal range of motion. Neck supple. No JVD present. Carotid bruit is not present.  Cardiovascular: Normal rate, regular rhythm and normal heart sounds.   Pulmonary/Chest: Effort normal and breath sounds normal.  Abdominal: Normal appearance. There is no splenomegaly or hepatomegaly.  Musculoskeletal: Normal range of motion.  Walks with a cane  Neurological: She is alert and oriented to person, place, and time.  Skin: Skin is warm, dry and intact. No rash noted. No pallor.  Psychiatric: She has a normal mood and affect. Her behavior is normal. Judgment and thought content normal.    Results for orders placed or performed during the hospital encounter of 02/11/16  CBC with Differential  Result Value Ref Range   WBC 9.8 3.6 - 11.0 K/uL   RBC 3.68 (L) 3.80 - 5.20 MIL/uL   Hemoglobin 12.5 12.0 - 16.0 g/dL   HCT 40.936.3 81.135.0 - 91.447.0 %   MCV 98.8 80.0 - 100.0 fL   MCH 34.0 26.0 - 34.0 pg   MCHC 34.4 32.0 - 36.0 g/dL   RDW 78.213.3 95.611.5 - 21.314.5 %   Platelets 266 150 - 440 K/uL   Neutrophils  Relative % 81 %   Neutro Abs 7.9 (H) 1.4 - 6.5 K/uL   Lymphocytes Relative 9 %   Lymphs Abs 0.9 (L) 1.0 - 3.6 K/uL   Monocytes Relative 10 %   Monocytes Absolute 1.0 (H) 0.2 - 0.9 K/uL   Eosinophils Relative 0 %   Eosinophils Absolute 0.0 0 - 0.7 K/uL   Basophils Relative 0 %   Basophils Absolute 0.0 0 - 0.1 K/uL  Comprehensive metabolic panel  Result Value Ref Range   Sodium 127 (L) 135 - 145 mmol/L   Potassium 4.2 3.5 - 5.1 mmol/L   Chloride 92 (L) 101 - 111 mmol/L   CO2 26 22 - 32 mmol/L   Glucose, Bld 111 (H) 65 - 99 mg/dL   BUN 7 6 - 20 mg/dL   Creatinine, Ser 1.61 0.44 - 1.00 mg/dL   Calcium 8.9 8.9 - 09.6 mg/dL   Total Protein 6.8 6.5 - 8.1 g/dL   Albumin 4.2 3.5 - 5.0 g/dL   AST 30 15 - 41 U/L   ALT 16 14 - 54 U/L   Alkaline Phosphatase 57 38  - 126 U/L   Total Bilirubin 0.8 0.3 - 1.2 mg/dL   GFR calc non Af Amer >60 >60 mL/min   GFR calc Af Amer >60 >60 mL/min   Anion gap 9 5 - 15  Basic metabolic panel  Result Value Ref Range   Sodium 129 (L) 135 - 145 mmol/L   Potassium 4.0 3.5 - 5.1 mmol/L   Chloride 96 (L) 101 - 111 mmol/L   CO2 25 22 - 32 mmol/L   Glucose, Bld 98 65 - 99 mg/dL   BUN 9 6 - 20 mg/dL   Creatinine, Ser 0.45 (L) 0.44 - 1.00 mg/dL   Calcium 8.3 (L) 8.9 - 10.3 mg/dL   GFR calc non Af Amer >60 >60 mL/min   GFR calc Af Amer >60 >60 mL/min   Anion gap 8 5 - 15  CBC  Result Value Ref Range   WBC 8.2 3.6 - 11.0 K/uL   RBC 3.33 (L) 3.80 - 5.20 MIL/uL   Hemoglobin 11.4 (L) 12.0 - 16.0 g/dL   HCT 40.9 (L) 81.1 - 91.4 %   MCV 101.4 (H) 80.0 - 100.0 fL   MCH 34.3 (H) 26.0 - 34.0 pg   MCHC 33.8 32.0 - 36.0 g/dL   RDW 78.2 95.6 - 21.3 %   Platelets 223 150 - 440 K/uL      Assessment & Plan:   Problem List Items Addressed This Visit      Unprioritized   Anxiety and depression    Stable, continue present medications.        Chronic pain syndrome    Refill Zofran and Tizanidine      Hypertension    Elevated in office.  White coat.  Continue to monitor at home      Insomnia    Still a problem       Other Visit Diagnoses   None.      Follow up plan: Return in about 6 months (around 01/30/2017).

## 2016-08-02 NOTE — Assessment & Plan Note (Signed)
Refill Zofran and Tizanidine

## 2016-08-05 ENCOUNTER — Other Ambulatory Visit: Payer: Self-pay | Admitting: Unknown Physician Specialty

## 2016-08-23 NOTE — Telephone Encounter (Signed)
Your patient 

## 2017-01-24 ENCOUNTER — Other Ambulatory Visit: Payer: Self-pay | Admitting: Unknown Physician Specialty

## 2017-03-11 ENCOUNTER — Ambulatory Visit: Payer: Medicare Other | Admitting: Unknown Physician Specialty

## 2017-03-18 ENCOUNTER — Ambulatory Visit: Payer: Medicare Other | Admitting: Unknown Physician Specialty

## 2017-03-26 ENCOUNTER — Other Ambulatory Visit: Payer: Self-pay

## 2017-03-26 MED ORDER — ONDANSETRON HCL 4 MG PO TABS: 4.0000 mg | ORAL_TABLET | Freq: Every day | ORAL | 6 refills | Status: DC | PRN

## 2017-03-26 NOTE — Telephone Encounter (Signed)
Routing to provider, appt on 04/08/17.

## 2017-04-08 ENCOUNTER — Ambulatory Visit: Payer: Medicare Other | Admitting: Unknown Physician Specialty

## 2017-04-09 ENCOUNTER — Ambulatory Visit: Payer: Medicare Other | Admitting: Unknown Physician Specialty

## 2017-04-16 ENCOUNTER — Ambulatory Visit (INDEPENDENT_AMBULATORY_CARE_PROVIDER_SITE_OTHER): Payer: Medicare Other | Admitting: Unknown Physician Specialty

## 2017-04-16 ENCOUNTER — Encounter: Payer: Self-pay | Admitting: Unknown Physician Specialty

## 2017-04-16 VITALS — BP 138/70 | HR 73 | Temp 98.4°F | Ht 65.0 in | Wt 105.4 lb

## 2017-04-16 DIAGNOSIS — M810 Age-related osteoporosis without current pathological fracture: Secondary | ICD-10-CM | POA: Diagnosis not present

## 2017-04-16 DIAGNOSIS — I10 Essential (primary) hypertension: Secondary | ICD-10-CM

## 2017-04-16 DIAGNOSIS — E871 Hypo-osmolality and hyponatremia: Secondary | ICD-10-CM

## 2017-04-16 DIAGNOSIS — E559 Vitamin D deficiency, unspecified: Secondary | ICD-10-CM | POA: Diagnosis not present

## 2017-04-16 DIAGNOSIS — F329 Major depressive disorder, single episode, unspecified: Secondary | ICD-10-CM

## 2017-04-16 DIAGNOSIS — Z7189 Other specified counseling: Secondary | ICD-10-CM | POA: Insufficient documentation

## 2017-04-16 DIAGNOSIS — Z0000 Encounter for general adult medical examination without abnormal findings: Secondary | ICD-10-CM

## 2017-04-16 DIAGNOSIS — F32A Depression, unspecified: Secondary | ICD-10-CM

## 2017-04-16 DIAGNOSIS — F419 Anxiety disorder, unspecified: Secondary | ICD-10-CM | POA: Diagnosis not present

## 2017-04-16 DIAGNOSIS — E875 Hyperkalemia: Secondary | ICD-10-CM

## 2017-04-16 NOTE — Assessment & Plan Note (Addendum)
Followed by endocrine.  Asking me to f/u her meds.  Taking high dose Vit D.  Will check levels today

## 2017-04-16 NOTE — Assessment & Plan Note (Signed)
Check Vit D 

## 2017-04-16 NOTE — Progress Notes (Signed)
BP 138/70   Pulse 73   Temp 98.4 F (36.9 C)   Ht 5\' 5"  (1.651 m) Comment: Pt declined, uses walker. Pt reported  Wt 105 lb 6.4 oz (47.8 kg) Comment: Pt declined, uses walker. Reported weight.  SpO2 97%   BMI 17.54 kg/m    Subjective:    Patient ID: Nicole Butler, female    DOB: 11/15/1945, 71 y.o.   MRN: [id number]  HPI: Nicole Butler is a 71 y.o. female  Chief Complaint  Patient presents with  . Annual Exam  . Medication Refill   Functional Status Survey: Is the patient deaf or have difficulty hearing?: No Does the patient have difficulty seeing, even when wearing glasses/contacts?: Yes Does the patient have difficulty concentrating, remembering, or making decisions?: No Does the patient have difficulty walking or climbing stairs?: Yes Does the patient have difficulty dressing or bathing?: Yes Does the patient have difficulty doing errands alone such as visiting a doctor's office or shopping?: Yes  Anxiety Depression Doing well on Trazadone 100 mg QHS [facility] 04/16/2017 08/02/2016 08/02/2016 08/02/2016  Decreased Interest 2 1 1 1   Down, Depressed, Hopeless 0 1 1 1   PHQ - 2 Score 2 2 2 2   Altered sleeping 3 3 3  -  Tired, decreased energy 2 3 3  -  Change in appetite 0 1 1 -  Feeling bad or failure about yourself  2 2 2  -  Trouble concentrating 1 1 1  -  Moving slowly or fidgety/restless 0 1 1 -  Suicidal thoughts 0 0 0 -  PHQ-9 Score 10 13 13  -   Fall Risk  04/16/2017 08/02/2016  Falls in the past year? Yes Yes  Number falls in past yr: 2 or more 2 or more  Injury with Fall? Yes Yes  Risk Factor Category  High Fall Risk -  Risk for fall due to : Impaired balance/gait;Impaired mobility -   Social History   Social History  . Marital status: Single    Spouse name: N/A  . Number of children: N/A  . Years of education: N/A   Occupational History  . Not on file.   Social History Main Topics  . Smoking status: Former Smoker    Packs/day: 1.00   Years: 10.00    Types: Cigarettes    Quit date: 12/15/1995  . Smokeless tobacco: Never Used  . Alcohol use 4.8 oz/week    8 Glasses of wine per week     Comment: 2 GLASSES EVERY EVENING  . Drug use: No  . Sexual activity: Not on file   Other Topics Concern  . Not on file   Social History Narrative  . No narrative on file   Family History  Problem Relation Age of Onset  . Alzheimer's disease Mother   . Cancer Father        lung   Past Medical History:  Diagnosis Date  . Anemia    H/O   . Arthritis   . Broken femur (HCC)    left  . Complication of anesthesia    PT STATES SHE FORGETS TO BREATHE WHEN COMING OUT OF ANESTHESIA  . Hip pain    left  . Insomnia   . Osteopenia   . Osteoporosis   . Thyroid disease    Past Surgical History:  Procedure Laterality Date  . ANKLE SURGERY Left   . FOOT SURGERY Left    x3  . HARDWARE REMOVAL Left 12/21/2015   Procedure: HARDWARE  REMOVAL;  Surgeon: Juanell Fairly, MD;  Location: [facility] ORS;  Service: Orthopedics;  Laterality: Left;  Left HIP   . HIP SURGERY Left    x2  . KNEE SURGERY Right    x2  . LEG SURGERY Left    x2  . TOTAL HIP ARTHROPLASTY Right    x4   Hypertension Following.  Mostly white coat.  No SOB or chest pain.  Numbers below 140/80 at home.  Not at a high risk group  Osteoporosis Followed by Endocrine.  Pt felt the visit was helpful but would like me to start filling her medications.     Relevant past medical, surgical, family and social history reviewed and updated as indicated. Interim medical history since our last visit reviewed. Allergies and medications reviewed and updated.  Review of Systems  Constitutional: Negative.   HENT: Negative.   Eyes: Negative.   Respiratory: Negative.   Cardiovascular: Negative.   Gastrointestinal: Negative.   Endocrine: Negative.   Genitourinary: Negative.   Allergic/Immunologic: Negative.   Neurological: Negative.   Hematological: Negative.     Psychiatric/Behavioral: Negative.     Per HPI unless specifically indicated above     Objective:    BP 138/70   Pulse 73   Temp 98.4 F (36.9 C)   Ht 5\' 5"  (1.651 m) Comment: Pt declined, uses walker. Pt reported  Wt 105 lb 6.4 oz (47.8 kg) Comment: Pt declined, uses walker. Reported weight.  SpO2 97%   BMI 17.54 kg/m   Wt Readings from Last 3 Encounters:  04/16/17 105 lb 6.4 oz (47.8 kg)  02/11/16 110 lb (49.9 kg)  12/15/15 108 lb (49 kg)    Physical Exam  Constitutional: She is oriented to person, place, and time. She appears well-developed and well-nourished. No distress.  HENT:  Head: Normocephalic and atraumatic.  Eyes: Conjunctivae and lids are normal. Right eye exhibits no discharge. Left eye exhibits no discharge. No scleral icterus.  Neck: Normal range of motion. Neck supple. No JVD present. Carotid bruit is not present.  Cardiovascular: Normal rate, regular rhythm and normal heart sounds.   Pulmonary/Chest: Effort normal and breath sounds normal.  Abdominal: Normal appearance. There is no splenomegaly or hepatomegaly.  Musculoskeletal: Normal range of motion.  Neurological: She is alert and oriented to person, place, and time.  Skin: Skin is warm, dry and intact. No rash noted. No pallor.  Psychiatric: She has a normal mood and affect. Her behavior is normal. Judgment and thought content normal.    Results for orders placed or performed in visit on 04/16/17  HM DEXA SCAN  Result Value Ref Range   HM Dexa Scan Done at [facility]. See in Care Everywhere.       Assessment & Plan:   Problem List Items Addressed This Visit      Unprioritized   Advanced care planning/counseling discussion    A voluntary discussion about advance care planning including the explanation and discussion of advance directives was extensively discussed  with the patient.  Explanation about the health care proxy and Living will was reviewed and packet with forms with explanation of how to fill  them out was given.  During this discussion, the patient was able to identify a health care proxy as both children and has filled out the paperwork required.  She has had extensive discussions with children      Anxiety and depression    Stable, continue present medications.        Hypertension  White coat.  Good BP at home.  138/70 at home      Relevant Orders   Comprehensive metabolic panel   Lipid Panel w/o Chol/HDL Ratio   Hyponatremia    Check CMP      Relevant Orders   Comprehensive metabolic panel   Osteoporosis    Followed by endocrine.  Asking me to f/u her meds.  Taking high dose Vit D.  Will check levels today      Relevant Medications   Vitamin D, Ergocalciferol, (DRISDOL) 50000 units CAPS capsule   Other Relevant Orders   TSH   Vitamin D deficiency    Check Vit D      Relevant Orders   VITAMIN D 25 Hydroxy (Vit-D Deficiency, Fractures)    Other Visit Diagnoses    Annual physical exam    -  Primary       Follow up plan: Return in about 6 months (around 10/16/2017).

## 2017-04-16 NOTE — Assessment & Plan Note (Signed)
A voluntary discussion about advance care planning including the explanation and discussion of advance directives was extensively discussed  with the patient.  Explanation about the health care proxy and Living will was reviewed and packet with forms with explanation of how to fill them out was given.  During this discussion, the patient was able to identify a health care proxy as both children and has filled out the paperwork required.  She has had extensive discussions with children

## 2017-04-16 NOTE — Assessment & Plan Note (Addendum)
White coat.  Good BP at home.  138/70 at home

## 2017-04-16 NOTE — Assessment & Plan Note (Signed)
Check CMP.  ?

## 2017-04-16 NOTE — Assessment & Plan Note (Signed)
Stable, continue present medications.   

## 2017-04-17 LAB — TSH: TSH: 0.682 u[IU]/mL (ref 0.450–4.500)

## 2017-04-17 LAB — COMPREHENSIVE METABOLIC PANEL
A/G RATIO: 1.8 (ref 1.2–2.2)
ALT: 12 IU/L (ref 0–32)
AST: 23 IU/L (ref 0–40)
Albumin: 4.3 g/dL (ref 3.6–4.8)
Alkaline Phosphatase: 48 IU/L (ref 39–117)
BILIRUBIN TOTAL: 0.4 mg/dL (ref 0.0–1.2)
BUN/Creatinine Ratio: 13 (ref 12–28)
BUN: 7 mg/dL — ABNORMAL LOW (ref 8–27)
CO2: 24 mmol/L (ref 20–29)
Calcium: 9.5 mg/dL (ref 8.7–10.3)
Chloride: 93 mmol/L — ABNORMAL LOW (ref 96–106)
Creatinine, Ser: 0.54 mg/dL — ABNORMAL LOW (ref 0.57–1.00)
GFR calc non Af Amer: 97 mL/min/{1.73_m2} (ref >59)
GFR, EST AFRICAN AMERICAN: 111 mL/min/{1.73_m2} (ref >59)
GLOBULIN, TOTAL: 2.4 g/dL (ref 1.5–4.5)
Glucose: 77 mg/dL (ref 65–99)
POTASSIUM: 5.7 mmol/L — AB (ref 3.5–5.2)
SODIUM: 133 mmol/L — AB (ref 134–144)
TOTAL PROTEIN: 6.7 g/dL (ref 6.0–8.5)

## 2017-04-17 LAB — LIPID PANEL W/O CHOL/HDL RATIO
Cholesterol, Total: 174 mg/dL (ref 100–199)
HDL: 106 mg/dL (ref >39)
LDL Calculated: 56 mg/dL (ref 0–99)
Triglycerides: 62 mg/dL (ref 0–149)
VLDL Cholesterol Cal: 12 mg/dL (ref 5–40)

## 2017-04-17 LAB — VITAMIN D 25 HYDROXY (VIT D DEFICIENCY, FRACTURES): VIT D 25 HYDROXY: 73.6 ng/mL (ref 30.0–100.0)

## 2017-04-18 NOTE — Addendum Note (Signed)
Addended by: Gabriel CirriWICKER, Virgen Belland on: 04/18/2017 07:34 AM   Modules accepted: Orders

## 2017-04-24 ENCOUNTER — Other Ambulatory Visit: Payer: Medicare Other

## 2017-04-24 DIAGNOSIS — E875 Hyperkalemia: Secondary | ICD-10-CM

## 2017-04-25 LAB — BASIC METABOLIC PANEL
BUN/Creatinine Ratio: 15 (ref 12–28)
BUN: 7 mg/dL — AB (ref 8–27)
CALCIUM: 9.4 mg/dL (ref 8.7–10.3)
CHLORIDE: 89 mmol/L — AB (ref 96–106)
CO2: 23 mmol/L (ref 20–29)
Creatinine, Ser: 0.46 mg/dL — ABNORMAL LOW (ref 0.57–1.00)
GFR calc non Af Amer: 102 mL/min/{1.73_m2} (ref >59)
GFR, EST AFRICAN AMERICAN: 117 mL/min/{1.73_m2} (ref >59)
GLUCOSE: 79 mg/dL (ref 65–99)
POTASSIUM: 4.7 mmol/L (ref 3.5–5.2)
Sodium: 128 mmol/L — ABNORMAL LOW (ref 134–144)

## 2017-05-09 ENCOUNTER — Telehealth: Payer: Self-pay | Admitting: Unknown Physician Specialty

## 2017-05-09 NOTE — Telephone Encounter (Signed)
Called pt to schedule Annual Wellness Visit with N[facility]  - knb  °

## 2017-05-15 ENCOUNTER — Other Ambulatory Visit: Payer: Self-pay | Admitting: Unknown Physician Specialty

## 2017-06-25 ENCOUNTER — Other Ambulatory Visit: Payer: Self-pay | Admitting: Unknown Physician Specialty

## 2017-07-02 ENCOUNTER — Telehealth: Payer: Self-pay | Admitting: Unknown Physician Specialty

## 2017-07-02 NOTE — Telephone Encounter (Signed)
Called pt to schedule for Annual Wellness Visit with Nurse Health Advisor, Tiffany Hill, my c/b # is [phone]  Kathryn Brown ° °

## 2017-08-20 ENCOUNTER — Telehealth: Payer: Self-pay

## 2017-08-20 NOTE — Telephone Encounter (Signed)
Received a fax from the call center stating that patient has been trying to contact us. Called and spoke to patient. Apologized and explained that our phone have been out. Patient states she has already called and spoke with front office and scheduled an appointment. Nothing further needed.

## 2017-08-27 ENCOUNTER — Ambulatory Visit (INDEPENDENT_AMBULATORY_CARE_PROVIDER_SITE_OTHER): Payer: Medicare Other | Admitting: Unknown Physician Specialty

## 2017-08-27 ENCOUNTER — Encounter: Payer: Self-pay | Admitting: Unknown Physician Specialty

## 2017-08-27 VITALS — BP 152/79 | HR 77 | Temp 98.6°F

## 2017-08-27 DIAGNOSIS — Z23 Encounter for immunization: Secondary | ICD-10-CM | POA: Diagnosis not present

## 2017-08-27 DIAGNOSIS — R350 Frequency of micturition: Secondary | ICD-10-CM | POA: Diagnosis not present

## 2017-08-27 DIAGNOSIS — H6191 Disorder of right external ear, unspecified: Secondary | ICD-10-CM | POA: Insufficient documentation

## 2017-08-27 DIAGNOSIS — R339 Retention of urine, unspecified: Secondary | ICD-10-CM | POA: Diagnosis not present

## 2017-08-27 MED ORDER — MUPIROCIN 2 % EX OINT: 1.0000 "application " | TOPICAL_OINTMENT | Freq: Two times a day (BID) | CUTANEOUS | 0 refills | Status: DC

## 2017-08-27 NOTE — Assessment & Plan Note (Addendum)
New problem.  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  Refusing Urology referral at this time.  Explained no effective medications.  Encouraged to discuss with pain clinic.

## 2017-08-27 NOTE — Assessment & Plan Note (Addendum)
New problem.  Will rx Bactroban.  If continues not to heal refer to dermatology

## 2017-08-27 NOTE — Progress Notes (Signed)
BP (!) 152/79   Pulse 77   Temp 98.6 F (37 C)   SpO2 96%    Subjective:    Patient ID: Nicole Butler, female    DOB: 09/22/1946, 71 y.o.   MRN: [id number]  HPI: Nicole Butler is a 71 y.o. female  Chief Complaint  Patient presents with  . Follow-up   Bladder Trouble emptying bladder and feels like she has to urinate at the time.  Pt is on narcotic medications for chronic back plus has spinal stenosis.    Right Ear States her right ear gets red and sore.  Never completely going away.  Worse in the last year.    Relevant past medical, surgical, family and social history reviewed and updated as indicated. Interim medical history since our last visit reviewed. Allergies and medications reviewed and updated.  Review of Systems  Per HPI unless specifically indicated above     Objective:    BP (!) 152/79   Pulse 77   Temp 98.6 F (37 C)   SpO2 96%   Wt Readings from Last 3 Encounters:  04/16/17 105 lb 6.4 oz (47.8 kg)  02/11/16 110 lb (49.9 kg)  12/15/15 108 lb (49 kg)    Physical Exam  Constitutional: She is oriented to person, place, and time. She appears well-developed and well-nourished. No distress.  HENT:  Head: Normocephalic and atraumatic.  Rt ear with scab formation on top of right ear  Eyes: Conjunctivae and lids are normal. Right eye exhibits no discharge. Left eye exhibits no discharge. No scleral icterus.  Neck: Normal range of motion. Neck supple. No JVD present. Carotid bruit is not present.  Cardiovascular: Normal rate, regular rhythm and normal heart sounds.   Pulmonary/Chest: Effort normal and breath sounds normal.  Abdominal: Normal appearance. There is no splenomegaly or hepatomegaly.  Musculoskeletal: Normal range of motion.  Neurological: She is alert and oriented to person, place, and time.  Skin: Skin is warm, dry and intact. No rash noted. No pallor.  Psychiatric: She has a normal mood and affect. Her behavior is normal. Judgment and thought  content normal.    Results for orders placed or performed in visit on 04/24/17  Basic Metabolic Panel (BMET)  Result Value Ref Range   Glucose 79 65 - 99 mg/dL   BUN 7 (L) 8 - 27 mg/dL   Creatinine, Ser 4.090.46 (L) 0.57 - 1.00 mg/dL   GFR calc non Af Amer 102 >59 mL/min/1.73   GFR calc Af Amer 117 >59 mL/min/1.73   BUN/Creatinine Ratio 15 12 - 28   Sodium 128 (L) 134 - 144 mmol/L   Potassium 4.7 3.5 - 5.2 mmol/L   Chloride 89 (L) 96 - 106 mmol/L   CO2 23 20 - 29 mmol/L   Calcium 9.4 8.7 - 10.3 mg/dL      Assessment & Plan:   Problem List Items Addressed This Visit      Unprioritized   Lesion of right external ear    New problem.  Will rx Bactroban.  If continues not to heal refer to dermatology      Urinary retention    New problem.  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  Refusing Urology referral at this time.  Explained no effective medications.  Encouraged to discuss with pain clinic.         Other Visit Diagnoses    Need for influenza vaccination    -  Primary   Relevant Orders  Flu vaccine HIGH DOSE PF (Completed)   Frequency of urination       Relevant Orders   UA/M w/rflx Culture, Routine      Pt unable to urinate.  Will bring in a specimen  Follow up plan: Return if symptoms worsen or fail to improve.

## 2017-09-01 LAB — MICROSCOPIC EXAMINATION
RBC, UA: NONE SEEN /hpf (ref 0–?)
WBC, UA: >30 /hpf — AB (ref 0–?)

## 2017-09-01 LAB — UA/M W/RFLX CULTURE, ROUTINE
BILIRUBIN UA: NEGATIVE
NITRITE UA: POSITIVE — AB
PH UA: 7 (ref 5.0–7.5)
PROTEIN UA: NEGATIVE
RBC UA: NEGATIVE
Specific Gravity, UA: 1.015 (ref 1.005–1.030)
UUROB: 0.2 mg/dL (ref 0.2–1.0)

## 2017-09-01 LAB — URINE CULTURE, REFLEX

## 2017-09-02 ENCOUNTER — Other Ambulatory Visit: Payer: Self-pay | Admitting: Unknown Physician Specialty

## 2017-09-02 MED ORDER — NITROFURANTOIN MONOHYD MACRO 100 MG PO CAPS: 100.0000 mg | ORAL_CAPSULE | Freq: Two times a day (BID) | ORAL | 0 refills | Status: DC

## 2017-09-02 NOTE — Progress Notes (Signed)
RX discontinued with [facility].

## 2017-09-22 ENCOUNTER — Other Ambulatory Visit: Payer: Self-pay | Admitting: Unknown Physician Specialty

## 2017-09-22 NOTE — Telephone Encounter (Signed)
Not a controlled substance

## 2017-09-22 NOTE — Telephone Encounter (Signed)
Request for controlled substance 

## 2017-09-23 ENCOUNTER — Other Ambulatory Visit: Payer: Self-pay | Admitting: *Deleted

## 2017-09-23 MED ORDER — TRAZODONE HCL 50 MG PO TABS: 100.0000 mg | ORAL_TABLET | Freq: Every day | ORAL | 0 refills | Status: DC

## 2017-09-30 ENCOUNTER — Telehealth: Payer: Self-pay | Admitting: Unknown Physician Specialty

## 2017-09-30 DIAGNOSIS — R339 Retention of urine, unspecified: Secondary | ICD-10-CM

## 2017-09-30 DIAGNOSIS — H9390 Unspecified disorder of ear, unspecified ear: Secondary | ICD-10-CM

## 2017-09-30 NOTE — Telephone Encounter (Signed)
°  Copied from CRM [id number]. Topic: General - Other >> Sep 30, 2017  8:32 AM Gerrianne ScalePayne, Angela L wrote: Reason for CRM: patient calling stating that Dr Jamesetta OrleansWicker was suppose to put in two referrals for her a dermatologist and urologist referral pt can be reached today anytime after 3 on her home number she states that its been 3 weeks since she seen the doctor

## 2017-09-30 NOTE — Telephone Encounter (Signed)
OK, my notes say she refused a referral.  Happy to put it in

## 2017-09-30 NOTE — Telephone Encounter (Signed)
Pt is aware that she is referred to [facility] for urology and I will call pt back with dermatology location.

## 2017-09-30 NOTE — Telephone Encounter (Signed)
Called to inform pt that her referrals had been entered this morning.

## 2017-10-01 NOTE — Telephone Encounter (Signed)
Referral submitted to [facility] in [place]. [address], [place], [place] [address] [phone]

## 2017-10-01 NOTE — Telephone Encounter (Signed)
Pt.notified

## 2017-10-16 DIAGNOSIS — Z0289 Encounter for other administrative examinations: Secondary | ICD-10-CM | POA: Insufficient documentation

## 2017-10-21 ENCOUNTER — Ambulatory Visit: Payer: Medicare Other | Admitting: Unknown Physician Specialty

## 2017-10-24 ENCOUNTER — Ambulatory Visit: Payer: Medicare Other | Admitting: Urology

## 2017-11-14 ENCOUNTER — Encounter: Payer: Self-pay | Admitting: Urology

## 2017-11-14 ENCOUNTER — Other Ambulatory Visit
Admission: RE | Admit: 2017-11-14 | Discharge: 2017-11-14 | Disposition: A | Discharge status: Home / Self Care | Payer: Medicare Other | Source: Ambulatory Visit | Attending: Urology | Admitting: Urology

## 2017-11-14 ENCOUNTER — Ambulatory Visit (INDEPENDENT_AMBULATORY_CARE_PROVIDER_SITE_OTHER): Payer: Medicare Other | Admitting: Urology

## 2017-11-14 VITALS — BP 147/74 | HR 95 | Ht 67.0 in | Wt 98.0 lb

## 2017-11-14 DIAGNOSIS — N3000 Acute cystitis without hematuria: Secondary | ICD-10-CM | POA: Diagnosis not present

## 2017-11-14 DIAGNOSIS — R350 Frequency of micturition: Secondary | ICD-10-CM | POA: Diagnosis not present

## 2017-11-14 LAB — URINALYSIS, COMPLETE (UACMP) WITH MICROSCOPIC
BILIRUBIN URINE: NEGATIVE
Glucose, UA: NEGATIVE mg/dL
HGB URINE DIPSTICK: NEGATIVE
NITRITE: POSITIVE — AB
PH: 7 (ref 5.0–8.0)
Protein, ur: NEGATIVE mg/dL
RBC / HPF: NONE SEEN RBC/hpf (ref 0–5)
SPECIFIC GRAVITY, URINE: 1.015 (ref 1.005–1.030)

## 2017-11-14 LAB — BLADDER SCAN AMB NON-IMAGING

## 2017-11-14 MED ORDER — SULFAMETHOXAZOLE-TRIMETHOPRIM 800-160 MG PO TABS: 1.0000 | ORAL_TABLET | Freq: Two times a day (BID) | ORAL | 0 refills | Status: DC

## 2017-11-14 NOTE — Progress Notes (Signed)
11/14/2017 11:48 AM   Nicole Butler 29-May-1946 [id number]  Referring provider: Gabriel Cirri, NP [address], [place] [address]  Chief Complaint  Patient presents with  . Urinary Frequency    New Patient    HPI: 72 year old female who presents today for further evaluation of urinary frequency.  She reports that her symptoms started 4 months ago.  Her symptoms continue to worsen she was seen and evaluated by her PCP.  At that time, she was diagnosed and treated with an E. coli urinary tract infection on 08/27/2017.  Her urinary symptoms at that time improved dramatically but never completely resolved.  She is concerned she may have another infection today.  She is concerned today that she may not be emptying her bladder completely.  She reports that on occasion, when she uses the bathroom, she will have to quickly return to urinate 5-10 minutes after she just felt that she emptied her bladder.  She also has associated urgency and rare urge incontinence episodes.  Her urinary symptoms are new over the past 6 months.  She has rare and frequent urinary tract infections prior to the above.  She reports that her infections are no more frequent than once a year.  He is Dr. Laurette Schimke with urology  She does have a history of multiple orthopedic injuries, back injuries, and spinal stenosis.  She also uses chronic narcotics for pain related to chronic back pain.     PMH: Past Medical History:  Diagnosis Date  . Anemia    H/O   . Arthritis   . Broken femur (HCC)    left  . Complication of anesthesia    PT STATES SHE FORGETS TO BREATHE WHEN COMING OUT OF ANESTHESIA  . Hip pain    left  . Insomnia   . Osteopenia   . Osteoporosis   . Thyroid disease     Surgical History: Past Surgical History:  Procedure Laterality Date  . ANKLE SURGERY Left   . FOOT SURGERY Left    x3  . HARDWARE REMOVAL Left 12/21/2015   Procedure: HARDWARE REMOVAL;  Surgeon: Juanell Fairly, MD;  Location: [facility]  ORS;  Service: Orthopedics;  Laterality: Left;  Left HIP   . HIP SURGERY Left    x2  . KNEE SURGERY Right    x2  . LEG SURGERY Left    x2  . TOTAL HIP ARTHROPLASTY Right    x4    Home Medications:  Allergies as of 11/14/2017      Reactions   Butrans [buprenorphine] Hives   Fosamax [alendronate Sodium] Other (See Comments)   dysphagia      Medication List        Accurate as of 11/14/17 11:59 PM. Always use your most recent med list.          diclofenac sodium 1 % Gel Commonly known as:  VOLTAREN Apply topically 4 (four) times daily.   Lidocaine HCl 4 % Soln Apply topically.   mupirocin ointment 2 % Commonly known as:  BACTROBAN Place 1 application into the nose 2 (two) times daily.   ondansetron 4 MG tablet Commonly known as:  ZOFRAN Take 1 tablet (4 mg total) by mouth daily as needed for nausea or vomiting.   oxyCODONE-acetaminophen 5-325 MG tablet Commonly known as:  PERCOCET/ROXICET Take by mouth every 8 (eight) hours as needed.   polyethylene glycol packet Commonly known as:  MIRALAX / GLYCOLAX Take by mouth.   sulfamethoxazole-trimethoprim 800-160 MG tablet Commonly known as:  BACTRIM DS,SEPTRA DS Take 1 tablet by mouth every 12 (twelve) hours.   tiZANidine 4 MG capsule Commonly known as:  ZANAFLEX Take 1 capsule (4 mg total) by mouth 3 (three) times daily as needed for muscle spasms.   traZODone 50 MG tablet Commonly known as:  DESYREL Take 2 tablets (100 mg total) at bedtime by mouth.   Vitamin D (Ergocalciferol) 50000 units Caps capsule Commonly known as:  DRISDOL take 1 capsule by mouth every week       Allergies:  Allergies  Allergen Reactions  . Butrans [Buprenorphine] Hives  . Fosamax [Alendronate Sodium] Other (See Comments)    dysphagia    Family History: Family History  Problem Relation Age of Onset  . Alzheimer's disease Mother   . Cancer Father        lung    Social History:  reports that she quit smoking about 21  years ago. Her smoking use included cigarettes. She has a 10.00 pack-year smoking history. she has never used smokeless tobacco. She reports that she drinks about 4.8 oz of alcohol per week. She reports that she does not use drugs.  ROS: UROLOGY Frequent Urination?: Yes Hard to postpone urination?: Yes Burning/pain with urination?: No Get up at night to urinate?: Yes Leakage of urine?: Yes Urine stream starts and stops?: No Trouble starting stream?: No Do you have to strain to urinate?: No Blood in urine?: No Urinary tract infection?: Yes Sexually transmitted disease?: No Injury to kidneys or bladder?: No Painful intercourse?: No Weak stream?: No Currently pregnant?: No Vaginal bleeding?: No Last menstrual period?: n  Gastrointestinal Nausea?: Yes Vomiting?: No Indigestion/heartburn?: No Diarrhea?: No Constipation?: Yes  Constitutional Fever: No Night sweats?: No Weight loss?: No Fatigue?: Yes  Skin Skin rash/lesions?: No Itching?: No  Eyes Blurred vision?: No Double vision?: No  Ears/Nose/Throat Sore throat?: No Sinus problems?: No  Hematologic/Lymphatic Swollen glands?: No Easy bruising?: Yes  Cardiovascular Leg swelling?: No Chest pain?: No  Respiratory Cough?: No Shortness of breath?: No  Endocrine Excessive thirst?: No  Musculoskeletal Back pain?: Yes Joint pain?: Yes  Neurological Headaches?: No Dizziness?: Yes  Psychologic Depression?: No Anxiety?: No  Physical Exam: BP (!) 147/74   Pulse 95   Ht 5\' 7"  (1.702 m)   Wt 98 lb (44.5 kg)   BMI 15.35 kg/m   Constitutional:  Alert and oriented, No acute distress.  Thin, somewhat frail-appearing. HEENT: [place] AT, moist mucus membranes.  Trachea midline, no masses. Cardiovascular: No clubbing, cyanosis, or edema. Respiratory: Normal respiratory effort, no increased work of breathing. GI: Abdomen is soft, nontender, nondistended, no abdominal masses GU: No CVA tenderness.  Skin: No  rashes, bruises or suspicious lesions. Neurologic: Grossly intact, no focal deficits, moving all 4 extremities. Psychiatric: Normal mood and affect.  Laboratory Data: Lab Results  Component Value Date   WBC 8.2 02/12/2016   HGB 11.4 (L) 02/12/2016   HCT 33.7 (L) 02/12/2016   MCV 101.4 (H) 02/12/2016   PLT 223 02/12/2016    Lab Results  Component Value Date   CREATININE 0.46 (L) 04/24/2017    Urinalysis Results for orders placed or performed during the hospital encounter of 11/14/17  Culture, Urine  Result Value Ref Range   Specimen Description      URINE, CLEAN CATCH Performed at [facility], [address] [place], [place] [address]    Special Requests      Normal Performed at [facility], [address] [place], [place]  [address]    Culture >=100,000 COLONIES/mL ESCHERICHIA COLI (A)    Report Status 11/17/2017 FINAL    Organism ID, Bacteria ESCHERICHIA COLI (A)       Susceptibility   Escherichia coli - MIC*    AMPICILLIN <=2 SENSITIVE Sensitive     CEFAZOLIN <=4 SENSITIVE Sensitive     CEFTRIAXONE <=1 SENSITIVE Sensitive     CIPROFLOXACIN <=0.25 SENSITIVE Sensitive     GENTAMICIN <=1 SENSITIVE Sensitive     IMIPENEM <=0.25 SENSITIVE Sensitive     NITROFURANTOIN <=16 SENSITIVE Sensitive     TRIMETH/SULFA <=20 SENSITIVE Sensitive     AMPICILLIN/SULBACTAM <=2 SENSITIVE Sensitive     PIP/TAZO <=4 SENSITIVE Sensitive     Extended ESBL NEGATIVE Sensitive     * >=100,000 COLONIES/mL ESCHERICHIA COLI  Urinalysis, Complete w Microscopic  Result Value Ref Range   Color, Urine YELLOW YELLOW   APPearance HAZY (A) CLEAR   Specific Gravity, Urine 1.015 1.005 - 1.030   pH 7.0 5.0 - 8.0   Glucose, UA NEGATIVE NEGATIVE mg/dL   Hgb urine dipstick NEGATIVE NEGATIVE   Bilirubin Urine NEGATIVE NEGATIVE   Ketones, ur TRACE (A) NEGATIVE mg/dL   Protein, ur NEGATIVE NEGATIVE mg/dL   Nitrite POSITIVE (A) NEGATIVE   Leukocytes, UA TRACE (A) NEGATIVE     Squamous Epithelial / LPF 0-5 (A) NONE SEEN   WBC, UA 6-30 0 - 5 WBC/hpf   RBC / HPF NONE SEEN 0 - 5 RBC/hpf   Bacteria, UA MANY (A) NONE SEEN    Pertinent Imaging: PVR 0   Assessment & Plan:    1. Urinary frequency Likely partially related or exacerbated by #2 We will treat infection and then reassess voiding symptoms after infection has cleared Adequate bladder emptying today, no concern for incomplete bladder emptying Recommend Crede maneuver and double voiding as needed to ensure that she empties her bladder - Urinalysis, Complete w Microscopic; Future - BLADDER SCAN AMB NON-IMAGING - CULTURE, URINE COMPREHENSIVE; Future  2. Acute cystitis without hematuria UA positive, will treat with Bactrim and adjust as needed based on culture and sensitivity data - CULTURE, URINE COMPREHENSIVE; Future   Return in about 6 weeks (around 12/26/2017). UA/ recheck voiding symptoms  Vanna Scotland, MD  [facility] [address] [place], [place] [address] [phone]

## 2017-11-17 LAB — URINE CULTURE
Culture: >=100000 — AB
SPECIAL REQUESTS: NORMAL

## 2017-12-26 ENCOUNTER — Encounter: Payer: Self-pay | Admitting: Urology

## 2017-12-26 ENCOUNTER — Ambulatory Visit (INDEPENDENT_AMBULATORY_CARE_PROVIDER_SITE_OTHER): Payer: Medicare Other | Admitting: Urology

## 2017-12-26 ENCOUNTER — Other Ambulatory Visit
Admission: RE | Admit: 2017-12-26 | Discharge: 2017-12-26 | Disposition: A | Discharge status: Home / Self Care | Payer: Medicare Other | Source: Ambulatory Visit | Attending: Urology | Admitting: Urology

## 2017-12-26 VITALS — BP 152/89 | HR 93 | Ht 66.0 in | Wt 102.0 lb

## 2017-12-26 DIAGNOSIS — R350 Frequency of micturition: Secondary | ICD-10-CM

## 2017-12-26 DIAGNOSIS — N390 Urinary tract infection, site not specified: Secondary | ICD-10-CM

## 2017-12-26 LAB — URINALYSIS, COMPLETE (UACMP) WITH MICROSCOPIC
BACTERIA UA: NONE SEEN
Bilirubin Urine: NEGATIVE
Glucose, UA: NEGATIVE mg/dL
Hgb urine dipstick: NEGATIVE
KETONES UR: NEGATIVE mg/dL
NITRITE: NEGATIVE
PROTEIN: NEGATIVE mg/dL
Specific Gravity, Urine: 1.015 (ref 1.005–1.030)
pH: 7 (ref 5.0–8.0)

## 2017-12-26 NOTE — Progress Notes (Signed)
12/26/2017 3:39 PM   Nicole Butler [birth year] [id number]  Referring provider: Gabriel Cirri, NP [address], [place] [address]  Chief Complaint  Patient presents with  . Urinary Frequency    6wk    HPI: 72 year old female who initially presented approximately 6 weeks ago with severe urinary symptoms since October.  Prior to this, she had normal baseline urinary symptoms.    At that visit, she was noted to have an E. coli urinary tract at the time of the visit which is appropriately treated.  She also had a UTI back in October when her symptoms first started.  Today, her symptoms have nearly resolved.  She has significantly decreased urinary frequency near her baseline.  She is pleased with her voiding symptoms.  Further dysuria.  UA today is unremarkable with no further bacteria and is nitrite negative.  She does have a history of multiple orthopedic injuries, back injuries, and spinal stenosis.  She also uses chronic narcotics for pain related to chronic back pain.     PMH: Past Medical History:  Diagnosis Date  . Anemia    H/O   . Arthritis   . Broken femur (HCC)    left  . Complication of anesthesia    PT STATES SHE FORGETS TO BREATHE WHEN COMING OUT OF ANESTHESIA  . Hip pain    left  . Insomnia   . Osteopenia   . Osteoporosis   . Thyroid disease     Surgical History: Past Surgical History:  Procedure Laterality Date  . ANKLE SURGERY Left   . FOOT SURGERY Left    x3  . HARDWARE REMOVAL Left 12/21/2015   Procedure: HARDWARE REMOVAL;  Surgeon: Juanell Fairly, MD;  Location: [facility] ORS;  Service: Orthopedics;  Laterality: Left;  Left HIP   . HIP SURGERY Left    x2  . KNEE SURGERY Right    x2  . LEG SURGERY Left    x2  . TOTAL HIP ARTHROPLASTY Right    x4    Home Medications:  Allergies as of 12/26/2017      Reactions   Butrans [buprenorphine] Hives   Fosamax [alendronate Sodium] Other (See Comments)   dysphagia      Medication List        Accurate  as of 12/26/17 11:59 PM. Always use your most recent med list.          diclofenac sodium 1 % Gel Commonly known as:  VOLTAREN Apply topically 4 (four) times daily.   Lidocaine HCl 4 % Soln Apply topically.   mupirocin ointment 2 % Commonly known as:  BACTROBAN Place 1 application into the nose 2 (two) times daily.   ondansetron 4 MG tablet Commonly known as:  ZOFRAN Take 1 tablet (4 mg total) by mouth daily as needed for nausea or vomiting.   oxyCODONE-acetaminophen 5-325 MG tablet Commonly known as:  PERCOCET/ROXICET Take by mouth every 8 (eight) hours as needed.   polyethylene glycol packet Commonly known as:  MIRALAX / GLYCOLAX Take by mouth.   tiZANidine 4 MG capsule Commonly known as:  ZANAFLEX Take 1 capsule (4 mg total) by mouth 3 (three) times daily as needed for muscle spasms.   traZODone 50 MG tablet Commonly known as:  DESYREL Take 2 tablets (100 mg total) at bedtime by mouth.   Vitamin D (Ergocalciferol) 50000 units Caps capsule Commonly known as:  DRISDOL take 1 capsule by mouth every week       Allergies:  Allergies  Allergen Reactions  . Butrans [Buprenorphine] Hives  . Fosamax [Alendronate Sodium] Other (See Comments)    dysphagia    Family History: Family History  Problem Relation Age of Onset  . Alzheimer's disease Mother   . Cancer Father        lung    Social History:  reports that she quit smoking about 22 years ago. Her smoking use included cigarettes. She has a 10.00 pack-year smoking history. she has never used smokeless tobacco. She reports that she drinks about 4.8 oz of alcohol per week. She reports that she does not use drugs.  ROS: UROLOGY Frequent Urination?: No Hard to postpone urination?: Yes Burning/pain with urination?: No Get up at night to urinate?: Yes Leakage of urine?: No Urine stream starts and stops?: No Trouble starting stream?: No Do you have to strain to urinate?: No Blood in urine?: No Urinary tract  infection?: No Sexually transmitted disease?: No Injury to kidneys or bladder?: No Painful intercourse?: No Weak stream?: No Currently pregnant?: No Vaginal bleeding?: No Last menstrual period?: n  Gastrointestinal Nausea?: Yes Vomiting?: No Indigestion/heartburn?: Yes Diarrhea?: No Constipation?: No  Constitutional Fever: No Night sweats?: No Weight loss?: No Fatigue?: Yes  Skin Skin rash/lesions?: No Itching?: No  Eyes Blurred vision?: No Double vision?: No  Ears/Nose/Throat Sore throat?: No Sinus problems?: No  Hematologic/Lymphatic Swollen glands?: No Easy bruising?: No  Cardiovascular Leg swelling?: No Chest pain?: No  Respiratory Cough?: No Shortness of breath?: No  Endocrine Excessive thirst?: No  Musculoskeletal Back pain?: Yes Joint pain?: Yes  Neurological Headaches?: No Dizziness?: Yes  Psychologic Depression?: No Anxiety?: No  Physical Exam: BP (!) 152/89   Pulse 93   Ht 5\' 6"  (1.676 m)   Wt 102 lb (46.3 kg)   BMI 16.46 kg/m   Constitutional:  Alert and oriented, No acute distress.  Thin, somewhat frail-appearing. HEENT: [place] AT, moist mucus membranes.  Trachea midline, no masses. Cardiovascular: No clubbing, cyanosis, or edema. Respiratory: Normal respiratory effort, no increased work of breathing. Skin: No rashes, bruises or suspicious lesions. Neurologic: Grossly intact, no focal deficits, moving all 4 extremities. Psychiatric: Normal mood and affect.  Laboratory Data: Lab Results  Component Value Date   WBC 8.2 02/12/2016   HGB 11.4 (L) 02/12/2016   HCT 33.7 (L) 02/12/2016   MCV 101.4 (H) 02/12/2016   PLT 223 02/12/2016    Lab Results  Component Value Date   CREATININE 0.46 (L) 04/24/2017    Urinalysis Component     Latest Ref Rng & Units 12/26/2017  Color, Urine     YELLOW YELLOW  Appearance     CLEAR CLEAR  Specific Gravity, Urine     1.005 - 1.030 1.015  pH     5.0 - 8.0 7.0  Glucose     NEGATIVE  mg/dL NEGATIVE  Hgb urine dipstick     NEGATIVE NEGATIVE  Bilirubin Urine     NEGATIVE NEGATIVE  Ketones, ur     NEGATIVE mg/dL NEGATIVE  Protein     NEGATIVE mg/dL NEGATIVE  Nitrite     NEGATIVE NEGATIVE  Leukocytes, UA     NEGATIVE TRACE (A)  Squamous Epithelial / LPF     NONE SEEN 0-5 (A)  WBC, UA     0 - 5 WBC/hpf 6-30  RBC / HPF     0 - 5 RBC/hpf 0-5  Bacteria, UA     NONE SEEN NONE SEEN    Pertinent Imaging: N/a   Assessment &  Plan:    1. Urinary frequency Resolved with treatment of UTI - Urinalysis, Complete w Microscopic; Future  2. Recurrent UTI UTI x 2 Continue cranberry tabs bid If develop 3 UTI in 12 mo period, may consider suppressive abx with addition of topical estrogen cream - discussed at length with patient today Advised to call for same day visit if experiences UTI symptoms  F/u prn  Vanna Scotland, MD  [facility] [address], [address] [place], [place] [phone]

## 2018-03-13 ENCOUNTER — Telehealth: Payer: Self-pay | Admitting: Unknown Physician Specialty

## 2018-03-13 NOTE — Telephone Encounter (Signed)
Copied from CRM [id number]. Topic: Medicare AWV >> Mar 13, 2018 11:59 AM Waldemar Dickens wrote: Called to schedule Medicare Annual Wellness Visit with Nurse Health Advisor.  Pt asked to call back couldn't talk right away  If patient returns call please note: their last AWV was on 04/16/17 please schedule AWV with [facility] any date after April 16 2018  Thank you! For any questions please contact: Manuela Schwartz [phone]  Skype [email or web address]

## 2018-03-25 NOTE — Telephone Encounter (Signed)
Scheduled for 6/19

## 2018-04-22 ENCOUNTER — Ambulatory Visit: Payer: Medicare Other

## 2018-05-01 ENCOUNTER — Telehealth: Payer: Self-pay

## 2018-05-01 NOTE — Telephone Encounter (Signed)
Copied from CRM [id number]. Topic: Medicare AWV >> May 01, 2018  1:08 PM [place], [place] A, LPN wrote: Reason for CRM: Called patient due to cancelled appointment on 04/22/2018 for awv, Please reschedule with nurse health advisor . Please contact Reggie Welge,LPN at [facility] if patient needs transportation assistance.

## 2018-06-17 ENCOUNTER — Ambulatory Visit (INDEPENDENT_AMBULATORY_CARE_PROVIDER_SITE_OTHER): Payer: Medicare Other

## 2018-06-17 VITALS — BP 142/76 | HR 74 | Temp 98.4°F | Resp 17

## 2018-06-17 DIAGNOSIS — Z0000 Encounter for general adult medical examination without abnormal findings: Secondary | ICD-10-CM | POA: Diagnosis not present

## 2018-06-17 NOTE — Progress Notes (Signed)
Subjective:   Nicole Butler is a 72 y.o. female who presents for Medicare Annual (Subsequent) preventive examination.  Review of Systems:   Cardiac Risk Factors include: advanced age (>90men, >24 women)     Objective:     Vitals: BP (!) 142/76 (BP Location: Left Arm, Patient Position: Sitting)   Pulse 74   Temp 98.4 F (36.9 C) (Temporal)   Resp 17   SpO2 98%   There is no height or weight on file to calculate BMI.  Advanced Directives 06/17/2018 04/16/2017 02/11/2016  Does Patient Have a Medical Advance Directive? Yes Yes Yes  Type of Advance Directive Living will;Healthcare Power of Attorney Living will;Healthcare Power of [employer] Power of Attorney  Does patient want to make changes to medical advance directive? - - No - Patient declined  Copy of Healthcare Power of Attorney in Chart? No - copy requested - No - copy requested    Tobacco Social History   Tobacco Use  Smoking Status Former Smoker  . Packs/day: 1.00  . Years: 10.00  . Pack years: 10.00  . Types: Cigarettes  . Last attempt to quit: 12/15/1995  . Years since quitting: 22.5  Smokeless Tobacco Never Used     Counseling given: Not Answered   Clinical Intake:  Pre-visit preparation completed: Yes  Pain : 0-10 Pain Score: 7  Pain Type: Chronic pain Pain Location: Generalized Pain Descriptors / Indicators: Aching Pain Onset: More than a month ago Pain Frequency: Constant Pain Relieving Factors: took pain medication this morning   Pain Relieving Factors: took pain medication this morning   Nutritional Status: BMI <19  Underweight Nutritional Risks: None Diabetes: No  How often do you need to have someone help you when you read instructions, pamphlets, or other written materials from your doctor or pharmacy?: 1 - Never What is the last grade level you completed in school?: 3 years college , associates degree   Interpreter Needed?: No  Information entered by :: Sahaj Bona,LPN   Past  Medical History:  Diagnosis Date  . Anemia    H/O   . Arthritis   . Broken femur (HCC)    left  . Complication of anesthesia    PT STATES SHE FORGETS TO BREATHE WHEN COMING OUT OF ANESTHESIA  . Hip pain    left  . Insomnia   . Osteopenia   . Osteoporosis   . Thyroid disease    Past Surgical History:  Procedure Laterality Date  . ANKLE SURGERY Left   . FOOT SURGERY Left    x3  . HARDWARE REMOVAL Left 12/21/2015   Procedure: HARDWARE REMOVAL;  Surgeon: Juanell Fairly, MD;  Location: [facility] ORS;  Service: Orthopedics;  Laterality: Left;  Left HIP   . HIP SURGERY Left    x2  . KNEE SURGERY Right    x2  . LEG SURGERY Left    x2  . TOTAL HIP ARTHROPLASTY Right    x4   Family History  Problem Relation Age of Onset  . Alzheimer's disease Mother   . Cancer Father        lung   Social History   Socioeconomic History  . Marital status: Significant Other    Spouse name: Not on file  . Number of children: Not on file  . Years of education: Not on file  . Highest education level: Associate degree: academic program  Occupational History  . Not on file  Social Needs  . Financial resource strain: Not hard  at all  . Food insecurity:    Worry: Never true    Inability: Never true  . Transportation needs:    Medical: No    Non-medical: No  Tobacco Use  . Smoking status: Former Smoker    Packs/day: 1.00    Years: 10.00    Pack years: 10.00    Types: Cigarettes    Last attempt to quit: 12/15/1995    Years since quitting: 22.5  . Smokeless tobacco: Never Used  Substance and Sexual Activity  . Alcohol use: Yes    Alcohol/week: 8.0 standard drinks    Types: 8 Glasses of wine per week    Comment: 2 GLASSES EVERY EVENING  . Drug use: No  . Sexual activity: Not on file  Lifestyle  . Physical activity:    Days per week: 0 days    Minutes per session: 0 min  . Stress: Not at all  Relationships  . Social connections:    Talks on phone: More than three times a week    Gets  together: More than three times a week    Attends religious service: Never    Active member of club or organization: No    Attends meetings of clubs or organizations: Never    Relationship status: Living with partner  Other Topics Concern  . Not on file  Social History Narrative   Sees partner 3 times a week, rotates between houses    Outpatient Encounter Medications as of 06/17/2018  Medication Sig  . diclofenac sodium (VOLTAREN) 1 % GEL Apply topically 4 (four) times daily.  . naproxen sodium (ALEVE) 220 MG tablet Take 220 mg by mouth. Takes twice daily  . ondansetron (ZOFRAN) 4 MG tablet Take 1 tablet (4 mg total) by mouth daily as needed for nausea or vomiting.  Marland Kitchen. oxyCODONE-acetaminophen (PERCOCET/ROXICET) 5-325 MG tablet oxycodone-acetaminophen 5 mg-325 mg tablet  . polyethylene glycol (MIRALAX / GLYCOLAX) packet Take by mouth.  Marland Kitchen. tiZANidine (ZANAFLEX) 2 MG tablet tizanidine 2 mg tablet  . traZODone (DESYREL) 50 MG tablet trazodone 50 mg tablet  . Vitamin D, Ergocalciferol, (DRISDOL) 50000 units CAPS capsule take 1 capsule by mouth every week  . Lidocaine HCl 4 % SOLN Apply topically.  . mupirocin ointment (BACTROBAN) 2 % Place 1 application into the nose 2 (two) times daily. (Patient not taking: Reported on 06/17/2018)  . oxyCODONE-acetaminophen (PERCOCET/ROXICET) 5-325 MG tablet Take by mouth every 8 (eight) hours as needed.   Marland Kitchen. tiZANidine (ZANAFLEX) 4 MG capsule Take 1 capsule (4 mg total) by mouth 3 (three) times daily as needed for muscle spasms.  . traZODone (DESYREL) 50 MG tablet Take 2 tablets (100 mg total) at bedtime by mouth.   No facility-administered encounter medications on file as of 06/17/2018.     Activities of Daily Living In your present state of health, do you have any difficulty performing the following activities: 06/17/2018  Hearing? Y  Comment right ear problems    Vision? N  Difficulty concentrating or making decisions? N  Walking or climbing stairs? Y    Dressing or bathing? N  Doing errands, shopping? Y  Preparing Food and eating ? N  Using the Toilet? N  In the past six months, have you accidently leaked urine? Y  Comment wears pads if needed  Do you have problems with loss of bowel control? N  Managing your Medications? N  Managing your Finances? N  Housekeeping or managing your Housekeeping? N  Some recent data might be  hidden    Patient Care Team: Gabriel Cirri, NP as PCP - General (Nurse Practitioner) Gabriel Cirri, NP as PCP - Family Medicine (Nurse Practitioner) Buena Irish, MD (Student) Juanell Fairly, MD as Referring Physician (Orthopedic Surgery) Vanna Scotland, MD as Consulting Physician (Urology)    Assessment:   This is a routine wellness examination for Nicole Butler.  Exercise Activities and Dietary recommendations Current Exercise Habits: The patient does not participate in regular exercise at present, Exercise limited by: orthopedic condition(s)  Goals    . DIET - INCREASE WATER INTAKE     Recommend drinking at least 6-8 glasses of water a day        Fall Risk Fall Risk  06/17/2018 08/27/2017 04/16/2017 08/02/2016  Falls in the past year? Yes Yes Yes Yes  Number falls in past yr: 2 or more 2 or more 2 or more 2 or more  Injury with Fall? Yes Yes Yes Yes  Comment - possible broken ribs - -  Risk Factor Category  High Fall Risk - High Fall Risk -  Risk for fall due to : History of fall(s);Impaired mobility;Impaired balance/gait - Impaired balance/gait;Impaired mobility -  Follow up Falls prevention discussed;Falls evaluation completed;Education provided - - -   Is the patient's home free of loose throw rugs in walkways, pet beds, electrical cords, etc?   yes      Grab bars in the bathroom? yes      Handrails on the stairs?   no satirs       Adequate lighting?   yes  Timed Get Up and Go performed: Completed in 8 seconds with no use of assistive devices, steady gait. No intervention needed at this time.    Depression Screen PHQ 2/9 Scores 06/17/2018 08/27/2017 04/16/2017 08/02/2016  PHQ - 2 Score 0 3 2 2   PHQ- 9 Score - 7 10 13      Cognitive Function     6CIT Screen 06/17/2018  What Year? 0 points  What month? 0 points  What time? 0 points  Count back from 20 0 points  Months in reverse 0 points  Repeat phrase 0 points  Total Score 0    Immunization History  Administered Date(s) Administered  . Influenza, High Dose Seasonal PF 08/27/2017  . Influenza-Unspecified 08/17/2015, 06/19/2016  . Pneumococcal Conjugate-13 10/03/2014  . Pneumococcal Polysaccharide-23 08/17/2015    Qualifies for Shingles Vaccine? Yes, discussed shingrix vaccine   Screening Tests Health Maintenance  Topic Date Due  . Hepatitis C Screening  10/15/1946  . TETANUS/TDAP  10/09/1966  . INFLUENZA VACCINE  06/04/2018  . COLONOSCOPY  05/01/2027  . DEXA SCAN  Completed  . PNA vac Low Risk Adult  Completed  . MAMMOGRAM  Discontinued    Cancer Screenings: Lung: Low Dose CT Chest recommended if Age 61-80 years, 30 pack-year currently smoking OR have quit w/in 15years. Patient does not qualify. Breast:  Up to date on Mammogram? No   Due now- declined any further screening.  Up to date of Bone Density/Dexa? Yes 08/16/2015 Colorectal:  Completed 04/30/2017  Additional Screenings:  Hepatitis C Screening:  Due, will order for future labs      Plan:    I have personally reviewed and addressed the Medicare Annual Wellness questionnaire and have noted the following in the patient's chart:  A. Medical and social history B. Use of alcohol, tobacco or illicit drugs  C. Current medications and supplements D. Functional ability and status E.  Nutritional status F.  Physical activity G.  Advance directives H. List of other physicians I.  Hospitalizations, surgeries, and ER visits in previous 12 months J.  Vitals K. Screenings such as hearing and vision if needed, cognitive and depression L. Referrals and  appointments   In addition, I have reviewed and discussed with patient certain preventive protocols, quality metrics, and best practice recommendations. A written personalized care plan for preventive services as well as general preventive health recommendations were provided to patient.   Signed,  Marin Robertsiffany Angela Vazguez, LPN Nurse Health Advisor   Nurse Notes:none

## 2018-06-17 NOTE — Patient Instructions (Signed)
Nicole Butler , Thank you for taking time to come for your Medicare Wellness Visit. I appreciate your ongoing commitment to your health goals. Please review the following plan we discussed and let me know if I can assist you in the future.   Screening recommendations/referrals: Colonoscopy: completed 04/30/2017 Mammogram: due, Please call [phone] to schedule your mammogram.  Bone Density: completed 08/16/2015 Recommended yearly ophthalmology/optometry visit for glaucoma screening and checkup Recommended yearly dental visit for hygiene and checkup  Vaccinations: Influenza vaccine: due 07/2018 Pneumococcal vaccine: completed series Tdap vaccine: due, check with your insurance company for coverage  Shingles vaccine: shingrix eligible, check with your insurance company for coverage     Advanced directives: Please bring a copy of your health care power of attorney and living will to the office at your convenience.  Conditions/risks identified: Recommend drinking at least 6-8 glasses of water a day   Next appointment:  Follow up in one year for your annual wellness exam.    Preventive Care 65 Years and Older, Female Preventive care refers to lifestyle choices and visits with your health care provider that can promote health and wellness. What does preventive care include?  A yearly physical exam. This is also called an annual well check.  Dental exams once or twice a year.  Routine eye exams. Ask your health care provider how often you should have your eyes checked.  Personal lifestyle choices, including:  Daily care of your teeth and gums.  Regular physical activity.  Eating a healthy diet.  Avoiding tobacco and drug use.  Limiting alcohol use.  Practicing safe sex.  Taking low-dose aspirin every day.  Taking vitamin and mineral supplements as recommended by your health care provider. What happens during an annual well check? The services and screenings done by your  health care provider during your annual well check will depend on your age, overall health, lifestyle risk factors, and family history of disease. Counseling  Your health care provider may ask you questions about your:  Alcohol use.  Tobacco use.  Drug use.  Emotional well-being.  Home and relationship well-being.  Sexual activity.  Eating habits.  History of falls.  Memory and ability to understand (cognition).  Work and work Astronomerenvironment.  Reproductive health. Screening  You may have the following tests or measurements:  Height, weight, and BMI.  Blood pressure.  Lipid and cholesterol levels. These may be checked every 5 years, or more frequently if you are over 71 years old.  Skin check.  Lung cancer screening. You may have this screening every year starting at age 71 if you have a 30-pack-year history of smoking and currently smoke or have quit within the past 15 years.  Fecal occult blood test (FOBT) of the stool. You may have this test every year starting at age 71.  Flexible sigmoidoscopy or colonoscopy. You may have a sigmoidoscopy every 5 years or a colonoscopy every 10 years starting at age 71.  Hepatitis C blood test.  Hepatitis B blood test.  Sexually transmitted disease (STD) testing.  Diabetes screening. This is done by checking your blood sugar (glucose) after you have not eaten for a while (fasting). You may have this done every 1-3 years.  Bone density scan. This is done to screen for osteoporosis. You may have this done starting at age 71.  Mammogram. This may be done every 1-2 years. Talk to your health care provider about how often you should have regular mammograms. Talk with your health care provider  about your test results, treatment options, and if necessary, the need for more tests. Vaccines  Your health care provider may recommend certain vaccines, such as:  Influenza vaccine. This is recommended every year.  Tetanus, diphtheria, and  acellular pertussis (Tdap, Td) vaccine. You may need a Td booster every 10 years.  Zoster vaccine. You may need this after age 33.  Pneumococcal 13-valent conjugate (PCV13) vaccine. One dose is recommended after age 45.  Pneumococcal polysaccharide (PPSV23) vaccine. One dose is recommended after age 27. Talk to your health care provider about which screenings and vaccines you need and how often you need them. This information is not intended to replace advice given to you by your health care provider. Make sure you discuss any questions you have with your health care provider. Document Released: 11/17/2015 Document Revised: 07/10/2016 Document Reviewed: 08/22/2015 Elsevier Interactive Patient Education  2017 [place] Prevention in the Home Falls can cause injuries. They can happen to people of all ages. There are many things you can do to make your home safe and to help prevent falls. What can I do on the outside of my home?  Regularly fix the edges of walkways and driveways and fix any cracks.  Remove anything that might make you trip as you walk through a door, such as a raised step or threshold.  Trim any bushes or trees on the path to your home.  Use bright outdoor lighting.  Clear any walking paths of anything that might make someone trip, such as rocks or tools.  Regularly check to see if handrails are loose or broken. Make sure that both sides of any steps have handrails.  Any raised decks and porches should have guardrails on the edges.  Have any leaves, snow, or ice cleared regularly.  Use sand or salt on walking paths during winter.  Clean up any spills in your garage right away. This includes oil or grease spills. What can I do in the bathroom?  Use night lights.  Install grab bars by the toilet and in the tub and shower. Do not use towel bars as grab bars.  Use non-skid mats or decals in the tub or shower.  If you need to sit down in the shower, use  a plastic, non-slip stool.  Keep the floor dry. Clean up any water that spills on the floor as soon as it happens.  Remove soap buildup in the tub or shower regularly.  Attach bath mats securely with double-sided non-slip rug tape.  Do not have throw rugs and other things on the floor that can make you trip. What can I do in the bedroom?  Use night lights.  Make sure that you have a light by your bed that is easy to reach.  Do not use any sheets or blankets that are too big for your bed. They should not hang down onto the floor.  Have a firm chair that has side arms. You can use this for support while you get dressed.  Do not have throw rugs and other things on the floor that can make you trip. What can I do in the kitchen?  Clean up any spills right away.  Avoid walking on wet floors.  Keep items that you use a lot in easy-to-reach places.  If you need to reach something above you, use a strong step stool that has a grab bar.  Keep electrical cords out of the way.  Do not use floor polish or  wax that makes floors slippery. If you must use wax, use non-skid floor wax.  Do not have throw rugs and other things on the floor that can make you trip. What can I do with my stairs?  Do not leave any items on the stairs.  Make sure that there are handrails on both sides of the stairs and use them. Fix handrails that are broken or loose. Make sure that handrails are as long as the stairways.  Check any carpeting to make sure that it is firmly attached to the stairs. Fix any carpet that is loose or worn.  Avoid having throw rugs at the top or bottom of the stairs. If you do have throw rugs, attach them to the floor with carpet tape.  Make sure that you have a light switch at the top of the stairs and the bottom of the stairs. If you do not have them, ask someone to add them for you. What else can I do to help prevent falls?  Wear shoes that:  Do not have high heels.  Have  rubber bottoms.  Are comfortable and fit you well.  Are closed at the toe. Do not wear sandals.  If you use a stepladder:  Make sure that it is fully opened. Do not climb a closed stepladder.  Make sure that both sides of the stepladder are locked into place.  Ask someone to hold it for you, if possible.  Clearly mark and make sure that you can see:  Any grab bars or handrails.  First and last steps.  Where the edge of each step is.  Use tools that help you move around (mobility aids) if they are needed. These include:  Canes.  Walkers.  Scooters.  Crutches.  Turn on the lights when you go into a dark area. Replace any light bulbs as soon as they burn out.  Set up your furniture so you have a clear path. Avoid moving your furniture around.  If any of your floors are uneven, fix them.  If there are any pets around you, be aware of where they are.  Review your medicines with your doctor. Some medicines can make you feel dizzy. This can increase your chance of falling. Ask your doctor what other things that you can do to help prevent falls. This information is not intended to replace advice given to you by your health care provider. Make sure you discuss any questions you have with your health care provider. Document Released: 08/17/2009 Document Revised: 03/28/2016 Document Reviewed: 11/25/2014 Elsevier Interactive Patient Education  2017 [employer].

## 2018-08-14 ENCOUNTER — Telehealth: Payer: Self-pay

## 2018-08-14 NOTE — Telephone Encounter (Signed)
Copied from CRM [id number]. Topic: General - Other >> Aug 14, 2018 10:13 AM Gaynelle Adu wrote: Reason for CRM: patient is calling to request  a letter be mailed for her to be out of jury duty for November 4th. She stated she is unable to travel alone due to health issues. Please advise

## 2018-08-14 NOTE — Telephone Encounter (Signed)
Let me talk to other staff about how to go about doing this and I will let you know.

## 2018-08-18 NOTE — Telephone Encounter (Signed)
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  How do we go about doing this?

## 2018-08-18 NOTE — Telephone Encounter (Signed)
Just checking to see if you have been able to discuss this with the other providers?

## 2018-08-18 NOTE — Telephone Encounter (Signed)
Letter generated, will get a signature from provider and mail to patient

## 2018-09-22 ENCOUNTER — Encounter: Payer: Self-pay | Admitting: Nurse Practitioner

## 2018-09-22 ENCOUNTER — Ambulatory Visit: Payer: Medicare Other | Admitting: Nurse Practitioner

## 2018-09-22 VITALS — BP 138/85 | HR 73 | Temp 98.5°F | Ht 67.0 in | Wt 95.0 lb

## 2018-09-22 DIAGNOSIS — H9203 Otalgia, bilateral: Secondary | ICD-10-CM

## 2018-09-22 DIAGNOSIS — R1310 Dysphagia, unspecified: Secondary | ICD-10-CM | POA: Diagnosis not present

## 2018-09-22 MED ORDER — OMEPRAZOLE 20 MG PO CPDR: 20.0000 mg | DELAYED_RELEASE_CAPSULE | Freq: Every day | ORAL | 3 refills | Status: DC

## 2018-09-22 NOTE — Patient Instructions (Signed)

## 2018-09-22 NOTE — Assessment & Plan Note (Addendum)
Possibly related to reflux.  Will initiate Omeprazole 20 MG QDAY.  Referral to GI at [facility] for worsening symptoms and continued issues with dysphagia.  Refused labs today.

## 2018-09-22 NOTE — Assessment & Plan Note (Signed)
Referral to [facility] per patient request for ongoing ear pain with mild hearing loss.

## 2018-09-22 NOTE — Progress Notes (Addendum)
BP 138/85 (BP Location: Right Arm, Patient Position: Sitting, Cuff Size: Small)   Pulse 73   Temp 98.5 F (36.9 C)   Ht 5\' 7"  (1.702 m) Comment: Patient reported  Wt 95 lb (43.1 kg) Comment: Patient reported  SpO2 95%   BMI 14.88 kg/m    Subjective:    Patient ID: Nicole Butler, female    DOB: 11/13/1945, 72 y.o.   MRN: [id number]  HPI: Nicole Butler is a 72 y.o. female presents for referral requests to GI and ENT  Chief Complaint  Patient presents with  . Dysphagia    referral to GI- prefers [facility] or [facility]  . Ear Pain    [facility]   DYSPHAGIA: She reports symptoms have been going on for over a year.  When she swallows it is very painful and has a lot of belching, even drinking water has become uncomfortable.  Belching is present even when not eating, reports just swallowing saliva will cause it.  She "feels spasm when items go down to esophagus".  Occasional nausea and vomiting reported, but not all the time.  Has concerns for ongoing weight loss over past year.  Not currently on medication for GERD.  She reports "once and awhile I take lozenges for discomfort".  Denies coughing when eating meals.  Endorses reflux and burning with eating meals. Not currently a smoker, she quit over 20 years ago.  Has been eating soft foods and the pain with swallowing continues to be present.  At baseline she reports she has never been a "big eater", but appetite has declined with the pain while swallowing.  Takes daily Aleve for ongoing OA pain, followed by pain medicine clinic.  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 Tends to get a lot of ear wax and frequently cleans.  States ear pain has been present for "months and months", with R>L.  Has been to ENT before for similar symptoms and wishes to return for further work-up.  Reports the pain is throbbing and intermittent, sporadic.  Can not "give a number to it, just uncomfortable". Presents on daily basis.  Has had  increased difficulty with hearing from her right ear.  Denies drainage, tinnitus or recent URI.  Relevant past medical, surgical, family and social history reviewed and updated as indicated. Interim medical history since our last visit reviewed. Allergies and medications reviewed and updated.  Review of Systems  Constitutional: Positive for appetite change. Negative for activity change, diaphoresis, fatigue and fever.  HENT: Positive for ear pain, hearing loss and trouble swallowing. Negative for congestion, ear discharge, facial swelling, postnasal drip, rhinorrhea, sinus pressure, sinus pain, sneezing, tinnitus and voice change.   Eyes: Negative.   Respiratory: Negative for cough, chest tightness, shortness of breath and wheezing.   Cardiovascular: Negative for chest pain, palpitations and leg swelling.  Gastrointestinal: Negative for abdominal distention, abdominal pain, constipation, diarrhea, nausea and vomiting.  Endocrine: Negative for cold intolerance, heat intolerance, polydipsia, polyphagia and polyuria.  Allergic/Immunologic: Negative.   Neurological: Negative for dizziness, syncope, speech difficulty, weakness, light-headedness, numbness and headaches.  Psychiatric/Behavioral: Negative.    Per HPI unless specifically indicated above     Objective:    BP 138/85 (BP Location: Right Arm, Patient Position: Sitting, Cuff Size: Small)   Pulse 73   Temp 98.5 F (36.9 C)   Ht 5\' 7"  (1.702 m) Comment: Patient reported  Wt 95 lb (43.1 kg) Comment: Patient reported  SpO2 95%  BMI 14.88 kg/m   Wt Readings from Last 3 Encounters:  09/22/18 95 lb (43.1 kg)  12/26/17 102 lb (46.3 kg)  11/14/17 98 lb (44.5 kg)    Physical Exam  Constitutional: She is oriented to person, place, and time. Vital signs are normal. She appears cachectic.  HENT:  Head: Normocephalic and atraumatic.  Right Ear: Tympanic membrane, external ear and ear canal normal.  Left Ear: Tympanic membrane, external  ear and ear canal normal.  Nose: Nose normal.  Mouth/Throat: Uvula is midline, oropharynx is clear and moist and mucous membranes are normal.  Mild decrease in hearing bilateral ears.  Eyes: Pupils are equal, round, and reactive to light. Conjunctivae and EOM are normal. Right eye exhibits no discharge. Left eye exhibits no discharge.  Neck: Normal range of motion. Neck supple. No JVD present. No thyromegaly present.  Cardiovascular: Normal rate, regular rhythm and normal heart sounds.  Pulmonary/Chest: Effort normal and breath sounds normal.  Abdominal: Soft. Bowel sounds are normal. She exhibits no distension. There is no tenderness.  Hesitant with swallowing water on exam, no cough noted with swallow and normal swallow pattern observed.  Dentures in place.  Musculoskeletal: Normal range of motion.  Lymphadenopathy:    She has no cervical adenopathy.  Neurological: She is alert and oriented to person, place, and time. No cranial nerve deficit.  Skin: Skin is warm and dry.  Psychiatric: She has a normal mood and affect. Her behavior is normal. Judgment and thought content normal.  Nursing note and vitals reviewed.   Results for orders placed or performed during the hospital encounter of 12/26/17  Urinalysis, Complete w Microscopic  Result Value Ref Range   Color, Urine YELLOW YELLOW   APPearance CLEAR CLEAR   Specific Gravity, Urine 1.015 1.005 - 1.030   pH 7.0 5.0 - 8.0   Glucose, UA NEGATIVE NEGATIVE mg/dL   Hgb urine dipstick NEGATIVE NEGATIVE   Bilirubin Urine NEGATIVE NEGATIVE   Ketones, ur NEGATIVE NEGATIVE mg/dL   Protein, ur NEGATIVE NEGATIVE mg/dL   Nitrite NEGATIVE NEGATIVE   Leukocytes, UA TRACE (A) NEGATIVE   Squamous Epithelial / LPF 0-5 (A) NONE SEEN   WBC, UA 6-30 0 - 5 WBC/hpf   RBC / HPF 0-5 0 - 5 RBC/hpf   Bacteria, UA NONE SEEN NONE SEEN      Assessment & Plan:   Problem List Items Addressed This Visit      Digestive   Dysphagia - Primary    Possibly  related to reflux.  Will initiate Omeprazole 20 MG QDAY.  Referral to GI at [facility] for worsening symptoms and continued issues with dysphagia.  Refused labs today.      Relevant Orders   Ambulatory referral to Gastroenterology     Other   Ear pain, bilateral    Referral to [facility] per patient request for ongoing ear pain with mild hearing loss.      Relevant Orders   Ambulatory referral to ENT       Follow up plan: Return if symptoms worsen or fail to improve.

## 2018-10-29 ENCOUNTER — Other Ambulatory Visit: Payer: Self-pay

## 2018-10-29 ENCOUNTER — Ambulatory Visit: Payer: Medicare Other | Admitting: Gastroenterology

## 2018-10-29 ENCOUNTER — Other Ambulatory Visit: Payer: Self-pay | Admitting: Nurse Practitioner

## 2018-10-29 ENCOUNTER — Encounter: Payer: Self-pay | Admitting: Gastroenterology

## 2018-10-29 ENCOUNTER — Telehealth: Payer: Self-pay | Admitting: Unknown Physician Specialty

## 2018-10-29 VITALS — BP 134/77 | HR 75 | Ht 67.0 in | Wt 95.0 lb

## 2018-10-29 DIAGNOSIS — R1310 Dysphagia, unspecified: Secondary | ICD-10-CM

## 2018-10-29 DIAGNOSIS — R634 Abnormal weight loss: Secondary | ICD-10-CM | POA: Diagnosis not present

## 2018-10-29 DIAGNOSIS — Z01818 Encounter for other preprocedural examination: Secondary | ICD-10-CM

## 2018-10-29 MED ORDER — OMEPRAZOLE 40 MG PO CPDR: 40.0000 mg | DELAYED_RELEASE_CAPSULE | Freq: Every day | ORAL | 3 refills | Status: DC

## 2018-10-29 NOTE — Progress Notes (Signed)
Wyline MoodKiran Fedrick Cefalu MD, MRCP(U.K) [address]  [address]  [place], [place] [address]  Main: [phone]  Fax: [phone]   Gastroenterology Consultation  Referring Provider:     Marjie Skiffannady, Jolene T, NP Primary Care Physician:  Gabriel CirriWicker, Cheryl, NP Primary Gastroenterologist:  Dr. Wyline MoodKiran Zaniel Marineau  Reason for Consultation:     Dysphagia         HPI:   Nicole Butler is a 72 y.o. y/o female referred for consultation & management  by Dr. Gabriel CirriWicker, Cheryl, NP.     She has been referred for dysphagia.     Dysphagia: Onset and any progression: getting worse for the last 1 year  Frequency: every meal  Foods affected : solids and liquids- feels it going down her esophagus and causes an abdominal spasm. Hurts going down , no issues with gowing down except it is slow. When she eats steak hurts more.  Prior episodes of impaction: no  History of asthma/allergy : no  History of heartburn/Reflux : lot of burping and some heartburn - not much reflux overall  Weight loss/weight gain : always slim- lost 13 lbs in 1 year- unintentional   Prior EGD: no  PPI/H2 blocker use : on Prilosec- takes it daily - may help slightly with al the issues . On ?dose cant recall.   No artificial sugars. She has a bowel movement once a day - hard as she is on opiods. Symptoms are worse when she has not had a good bowel movement.   Never had a colonoscopy, Past Medical History:  Diagnosis Date  . Anemia    H/O   . Arthritis   . Broken femur (HCC)    left  . Complication of anesthesia    PT STATES SHE FORGETS TO BREATHE WHEN COMING OUT OF ANESTHESIA  . Hip pain    left  . Insomnia   . Osteopenia   . Osteoporosis   . Thyroid disease     Past Surgical History:  Procedure Laterality Date  . ANKLE SURGERY Left   . FOOT SURGERY Left    x3  . HARDWARE REMOVAL Left 12/21/2015   Procedure: HARDWARE REMOVAL;  Surgeon: Juanell FairlyKevin Krasinski, MD;  Location: [facility] ORS;  Service: Orthopedics;  Laterality: Left;  Left HIP   .  HIP SURGERY Left    x2  . KNEE SURGERY Right    x2  . LEG SURGERY Left    x2  . TOTAL HIP ARTHROPLASTY Right    x4    Prior to Admission medications   Medication Sig Start Date End Date Taking? Authorizing Provider  diclofenac sodium (VOLTAREN) 1 % GEL Apply topically 4 (four) times daily.   Yes [provider]  Lidocaine HCl 4 % SOLN Apply topically.   Yes [provider]  naproxen sodium (ALEVE) 220 MG tablet Take 220 mg by mouth. Takes twice daily   Yes [provider]  omeprazole (PRILOSEC) 20 MG capsule Take 1 capsule (20 mg total) by mouth daily. 09/22/18  Yes Cannady, Jolene T, NP  ondansetron (ZOFRAN) 4 MG tablet Take 1 tablet (4 mg total) by mouth daily as needed for nausea or vomiting. 03/26/17  Yes Johnson, Megan P, DO  oxyCODONE-acetaminophen (PERCOCET/ROXICET) 5-325 MG tablet Take by mouth every 8 (eight) hours as needed.  01/09/17  Yes [provider]  polyethylene glycol (MIRALAX / GLYCOLAX) packet Take by mouth.   Yes [provider]  tiZANidine (ZANAFLEX) 2 MG tablet tizanidine 2 mg tablet 06/12/18  Yes [provider]  traZODone (DESYREL) 50 MG tablet Take 2 tablets (100 mg total) at bedtime by mouth. 09/23/17  Yes Gabriel CirriWicker, Cheryl, NP  mupirocin ointment (BACTROBAN) 2 % Place 1 application into the nose 2 (two) times daily. Patient not taking: Reported on 06/17/2018 08/27/17   Gabriel CirriWicker, Cheryl, NP  oxyCODONE-acetaminophen (PERCOCET/ROXICET) 5-325 MG tablet oxycodone-acetaminophen 5 mg-325 mg tablet 06/12/18   [provider]    Family History  Problem Relation Age of Onset  . Alzheimer's disease Mother   . Cancer Father        lung     Social History   Tobacco Use  . Smoking status: Former Smoker    Packs/day: 1.00    Years: 10.00    Pack years: 10.00    Types: Cigarettes    Last attempt to quit: 12/15/1995    Years since quitting: 22.8  . Smokeless tobacco: Never Used  Substance Use Topics  . Alcohol  use: Yes    Alcohol/week: 8.0 standard drinks    Types: 8 Glasses of wine per week    Comment: 2 GLASSES EVERY EVENING  . Drug use: No    Allergies as of 10/29/2018 - Review Complete 10/29/2018  Allergen Reaction Noted  . Butrans [buprenorphine] Hives 06/11/2014  . Fosamax [alendronate sodium] Other (See Comments) 08/29/2015    Review of Systems:    All systems reviewed and negative except where noted in HPI.   Physical Exam:  BP 134/77   Pulse 75   Ht 5\' 7"  (1.702 m)   Wt 95 lb (43.1 kg)   BMI 14.88 kg/m  No LMP recorded. Patient is postmenopausal. Psych:  Alert and cooperative. Normal mood and affect. General: very thin , walks with a walker  Head:  Normocephalic and atraumatic. Eyes:  Sclera clear, no icterus.   Conjunctiva pink. Ears:  Normal auditory acuity. Nose:  No deformity, discharge, or lesions. Mouth:  No deformity or lesions,oropharynx pink & moist. Neck:  Supple; no masses or thyromegaly. Lungs:  Respirations even and unlabored.  Clear throughout to auscultation.   No wheezes, crackles, or rhonchi. No acute distress. Heart:  Regular rate and rhythm; no murmurs, clicks, rubs, or gallops. Abdomen:  Normal bowel sounds.  No bruits.  Soft, non-tender and non-distended without masses, hepatosplenomegaly or hernias noted.  No guarding or rebound tenderness.    Neurologic:  Alert and oriented x3;  grossly normal neurologically. Skin:  Intact without significant lesions or rashes. No jaundice. Lymph Nodes:  No significant cervical adenopathy. Psych:  Alert and cooperative. Normal mood and affect.  Imaging Studies: No results found.  Assessment and Plan:   Nicole Butler is a 72 y.o. y/o female has been referred for dysphagia. Unintentional weight loss. She is not keen on a colonoscopy, she has never had one  Plan   1.  EGD for dysphagia 2. Prilosec 40 mg a day to treat reflux 3. CBC,TSH. 4. At next visit if still losing weight CT chest/abdomen with colonoscopy  if she agrees  I have discussed alternative options, risks & benefits,  which include, but are not limited to, bleeding, infection, perforation,respiratory complication & drug reaction.  The patient agrees with this plan & written consent will be obtained.    Follow up in 3 weeks   Dr Wyline MoodKiran Sherica Paternostro MD,MRCP(U.K)

## 2018-10-29 NOTE — Progress Notes (Signed)
Patient reports need for CBC with diff and TSH prior to endoscopy.  Orders entered.  She will come in upcoming week for labs.

## 2018-10-29 NOTE — Telephone Encounter (Signed)
Orders are entered, but also alert her Dr. Tobi BastosAnna also has CBC ordered for her.

## 2018-10-29 NOTE — Telephone Encounter (Signed)
Patient states she is needing the following labs drawn for endoscopy  Order code [id number] CBC with diff platelet  TSH--[id number] Diagnosis code for these are R13.10 and R63.4  Patient requesting these to be done either on 1/7 or 1/8  She would like a call back once these are in  Thank You

## 2018-10-30 NOTE — Telephone Encounter (Signed)
Spoke with patient to let her know.

## 2018-11-10 ENCOUNTER — Other Ambulatory Visit: Payer: Medicare Other

## 2018-11-10 DIAGNOSIS — Z01818 Encounter for other preprocedural examination: Secondary | ICD-10-CM

## 2018-11-11 LAB — THYROID PANEL WITH TSH
FREE THYROXINE INDEX: 2.1 (ref 1.2–4.9)
T3 UPTAKE RATIO: 29 % (ref 24–39)
T4 TOTAL: 7.2 ug/dL (ref 4.5–12.0)
TSH: 1.14 u[IU]/mL (ref 0.450–4.500)

## 2018-11-11 LAB — CBC WITH DIFFERENTIAL/PLATELET
BASOS ABS: 0 10*3/uL (ref 0.0–0.2)
Basos: 0 %
EOS (ABSOLUTE): 0.1 10*3/uL (ref 0.0–0.4)
Eos: 1 %
Hematocrit: 37.4 % (ref 34.0–46.6)
Hemoglobin: 13.1 g/dL (ref 11.1–15.9)
IMMATURE GRANS (ABS): 0 10*3/uL (ref 0.0–0.1)
Immature Granulocytes: 0 %
LYMPHS: 26 %
Lymphocytes Absolute: 1.9 10*3/uL (ref 0.7–3.1)
MCH: 32.9 pg (ref 26.6–33.0)
MCHC: 35 g/dL (ref 31.5–35.7)
MCV: 94 fL (ref 79–97)
Monocytes Absolute: 0.7 10*3/uL (ref 0.1–0.9)
Monocytes: 9 %
NEUTROS PCT: 64 %
Neutrophils Absolute: 4.7 10*3/uL (ref 1.4–7.0)
PLATELETS: 342 10*3/uL (ref 150–450)
RBC: 3.98 x10E6/uL (ref 3.77–5.28)
RDW: 12 % (ref 11.7–15.4)
WBC: 7.3 10*3/uL (ref 3.4–10.8)

## 2018-11-11 NOTE — Progress Notes (Signed)
Normal test results noted.  Please call patient and make them aware of normal results and will continue to monitor at regular visits.  Have a great day

## 2018-11-12 ENCOUNTER — Encounter
Admission: RE | Disposition: A | Discharge status: Home / Self Care | Payer: Self-pay | Source: Home / Self Care | Attending: Gastroenterology

## 2018-11-12 ENCOUNTER — Ambulatory Visit
Admission: RE | Admit: 2018-11-12 | Discharge: 2018-11-12 | Disposition: A | Discharge status: Home / Self Care | Payer: Medicare Other | Attending: Gastroenterology | Admitting: Gastroenterology

## 2018-11-12 ENCOUNTER — Encounter: Payer: Self-pay | Admitting: Anesthesiology

## 2018-11-12 ENCOUNTER — Ambulatory Visit: Payer: Medicare Other | Admitting: Anesthesiology

## 2018-11-12 DIAGNOSIS — G4700 Insomnia, unspecified: Secondary | ICD-10-CM | POA: Insufficient documentation

## 2018-11-12 DIAGNOSIS — Z87891 Personal history of nicotine dependence: Secondary | ICD-10-CM | POA: Diagnosis not present

## 2018-11-12 DIAGNOSIS — K209 Esophagitis, unspecified: Secondary | ICD-10-CM | POA: Insufficient documentation

## 2018-11-12 DIAGNOSIS — K222 Esophageal obstruction: Secondary | ICD-10-CM | POA: Diagnosis not present

## 2018-11-12 DIAGNOSIS — I10 Essential (primary) hypertension: Secondary | ICD-10-CM | POA: Diagnosis not present

## 2018-11-12 DIAGNOSIS — Z791 Long term (current) use of non-steroidal anti-inflammatories (NSAID): Secondary | ICD-10-CM | POA: Diagnosis not present

## 2018-11-12 DIAGNOSIS — Z96641 Presence of right artificial hip joint: Secondary | ICD-10-CM | POA: Diagnosis not present

## 2018-11-12 DIAGNOSIS — Z79899 Other long term (current) drug therapy: Secondary | ICD-10-CM | POA: Diagnosis not present

## 2018-11-12 DIAGNOSIS — Z79891 Long term (current) use of opiate analgesic: Secondary | ICD-10-CM | POA: Insufficient documentation

## 2018-11-12 DIAGNOSIS — R1310 Dysphagia, unspecified: Secondary | ICD-10-CM

## 2018-11-12 HISTORY — PX: ESOPHAGOGASTRODUODENOSCOPY (EGD) WITH PROPOFOL: SHX5813

## 2018-11-12 SURGERY — ESOPHAGOGASTRODUODENOSCOPY (EGD) WITH PROPOFOL
Anesthesia: General

## 2018-11-12 MED ORDER — MIDAZOLAM HCL 2 MG/2ML IJ SOLN
INTRAMUSCULAR | Status: DC | PRN
  Administered 2018-11-12: 1 mg via INTRAVENOUS

## 2018-11-12 MED ORDER — SODIUM CHLORIDE 0.9 % IV SOLN
INTRAVENOUS | Status: DC
  Administered 2018-11-12: 09:00:00 via INTRAVENOUS

## 2018-11-12 MED ORDER — PROPOFOL 500 MG/50ML IV EMUL
INTRAVENOUS | Status: AC
  Filled 2018-11-12: qty 50

## 2018-11-12 MED ORDER — LIDOCAINE HCL (CARDIAC) PF 100 MG/5ML IV SOSY
PREFILLED_SYRINGE | INTRAVENOUS | Status: DC | PRN
  Administered 2018-11-12: 30 mg via INTRAVENOUS

## 2018-11-12 MED ORDER — FENTANYL CITRATE (PF) 100 MCG/2ML IJ SOLN
INTRAMUSCULAR | Status: DC | PRN
  Administered 2018-11-12: 50 ug via INTRAVENOUS

## 2018-11-12 MED ORDER — PROPOFOL 500 MG/50ML IV EMUL
INTRAVENOUS | Status: DC | PRN
  Administered 2018-11-12: 100 ug/kg/min via INTRAVENOUS

## 2018-11-12 MED ORDER — FENTANYL CITRATE (PF) 100 MCG/2ML IJ SOLN
INTRAMUSCULAR | Status: AC
  Filled 2018-11-12: qty 2

## 2018-11-12 MED ORDER — MIDAZOLAM HCL 2 MG/2ML IJ SOLN
INTRAMUSCULAR | Status: AC
  Filled 2018-11-12: qty 2

## 2018-11-12 NOTE — Transfer of Care (Signed)
Immediate Anesthesia Transfer of Care Note  Patient: Nicole Butler  Procedure(s) Performed: ESOPHAGOGASTRODUODENOSCOPY (EGD) WITH PROPOFOL (N/A )  Patient Location: PACU  Anesthesia Type:General  Level of Consciousness: awake and sedated  Airway & Oxygen Therapy: Patient Spontanous Breathing and Patient connected to nasal cannula oxygen  Post-op Assessment: Report given to RN and Post -op Vital signs reviewed and stable  Post vital signs: Reviewed and stable  Last Vitals:  Vitals Value Taken Time  BP    Temp    Pulse    Resp    SpO2      Last Pain:  Vitals:   11/12/18 0817  TempSrc: Tympanic         Complications: No apparent anesthesia complications

## 2018-11-12 NOTE — H&P (Signed)
Nicole MoodKiran Issaac Shipper, MD [address], [address], [place], [place], [address] [address], [address], [place], [place], [address] Phone: [phone]  Fax: [phone]  Primary Care Physician:  Nicole CirriWicker, Cheryl, NP   Pre-Procedure History & Physical: HPI:  Nicole Butler is a 73 y.o. female is here for an endoscopy    Past Medical History:  Diagnosis Date  . Anemia    H/O   . Arthritis   . Broken femur (HCC)    left  . Complication of anesthesia    PT STATES SHE FORGETS TO BREATHE WHEN COMING OUT OF ANESTHESIA  . Hip pain    left  . Insomnia   . Osteopenia   . Osteoporosis   . Thyroid disease     Past Surgical History:  Procedure Laterality Date  . ANKLE SURGERY Left   . FOOT SURGERY Left    x3  . HARDWARE REMOVAL Left 12/21/2015   Procedure: HARDWARE REMOVAL;  Surgeon: Nicole FairlyKevin Krasinski, MD;  Location: [facility] ORS;  Service: Orthopedics;  Laterality: Left;  Left HIP   . HIP SURGERY Left    x2  . KNEE SURGERY Right    x2  . LEG SURGERY Left    x2  . TOTAL HIP ARTHROPLASTY Right    x4    Prior to Admission medications   Medication Sig Start Date End Date Taking? Authorizing Provider  diclofenac sodium (VOLTAREN) 1 % GEL Apply topically 4 (four) times daily.    [provider]  Lidocaine HCl 4 % SOLN Apply topically.    [provider]  mupirocin ointment (BACTROBAN) 2 % Place 1 application into the nose 2 (two) times daily. Patient not taking: Reported on 06/17/2018 08/27/17   Nicole CirriWicker, Cheryl, NP  naproxen sodium (ALEVE) 220 MG tablet Take 220 mg by mouth. Takes twice daily    [provider]  omeprazole (PRILOSEC) 40 MG capsule Take 1 capsule (40 mg total) by mouth daily. 10/29/18   Nicole MoodAnna, Eleno Weimar, MD  ondansetron (ZOFRAN) 4 MG tablet Take 1 tablet (4 mg total) by mouth daily as needed for nausea or vomiting. 03/26/17   Olevia PerchesJohnson, Nicole P, DO  oxyCODONE-acetaminophen (PERCOCET/ROXICET) 5-325 MG tablet Take by mouth every 8 (eight) hours as needed.   01/09/17   [provider]  oxyCODONE-acetaminophen (PERCOCET/ROXICET) 5-325 MG tablet oxycodone-acetaminophen 5 mg-325 mg tablet 06/12/18   [provider]  polyethylene glycol (MIRALAX / GLYCOLAX) packet Take by mouth.    [provider]  tiZANidine (ZANAFLEX) 2 MG tablet tizanidine 2 mg tablet 06/12/18   [provider]  traZODone (DESYREL) 50 MG tablet Take 2 tablets (100 mg total) at bedtime by mouth. 09/23/17   Nicole CirriWicker, Cheryl, NP    Allergies as of 10/30/2018 - Review Complete 10/29/2018  Allergen Reaction Noted  . Butrans [buprenorphine] Hives 06/11/2014  . Fosamax [alendronate sodium] Other (See Comments) 08/29/2015    Family History  Problem Relation Age of Onset  . Alzheimer's disease Mother   . Cancer Father        lung    Social History   Socioeconomic History  . Marital status: Significant Other    Spouse name: Not on file  . Number of children: Not on file  . Years of education: Not on file  . Highest education level: Associate degree: academic program  Occupational History  . Not on file  Social Needs  . Financial resource strain: Not hard at all  . Food insecurity:  Worry: Never true    Inability: Never true  . Transportation needs:    Medical: No    Non-medical: No  Tobacco Use  . Smoking status: Former Smoker    Packs/day: 1.00    Years: 10.00    Pack years: 10.00    Types: Cigarettes    Last attempt to quit: 12/15/1995    Years since quitting: 22.9  . Smokeless tobacco: Never Used  Substance and Sexual Activity  . Alcohol use: Yes    Alcohol/week: 8.0 standard drinks    Types: 8 Glasses of wine per week    Comment: 2 GLASSES EVERY EVENING  . Drug use: No  . Sexual activity: Not on file  Lifestyle  . Physical activity:    Days per week: 0 days    Minutes per session: 0 min  . Stress: Not at all  Relationships  . Social connections:    Talks on phone: More than three times a week    Gets together: More than  three times a week    Attends religious service: Never    Active member of club or organization: No    Attends meetings of clubs or organizations: Never    Relationship status: Living with partner  . Intimate partner violence:    Fear of current or ex partner: No    Emotionally abused: No    Physically abused: No    Forced sexual activity: No  Other Topics Concern  . 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 General:   Alert,  pleasant and cooperative in NAD Head:  Normocephalic and atraumatic. Neck:  Supple; no masses or thyromegaly. Lungs:  Clear throughout to auscultation, normal respiratory effort.    Heart:  +S1, +S2, Regular rate and rhythm, No edema. Abdomen:  Soft, nontender and nondistended. Normal bowel sounds, without guarding, and without rebound.   Neurologic:  Alert and  oriented x4;  grossly normal neurologically.  Impression/Plan: Nicole Butler is here for an endoscopy  to be performed for  evaluation of dysphagia    Risks, benefits, limitations, and alternatives regarding endoscopy and dilation have been reviewed with the patient.  Questions have been answered.  All parties agreeable.   Nicole Mood, MD  11/12/2018, 8:10 AM

## 2018-11-12 NOTE — Anesthesia Procedure Notes (Signed)
Performed by: Cook-Martin, Keelan Tripodi Pre-anesthesia Checklist: Patient identified, Emergency Drugs available, Suction available, Patient being monitored and Timeout performed Patient Re-evaluated:Patient Re-evaluated prior to induction Oxygen Delivery Method: Nasal cannula Preoxygenation: Pre-oxygenation with 100% oxygen Induction Type: IV induction Placement Confirmation: positive ETCO2 and CO2 detector       

## 2018-11-12 NOTE — Anesthesia Post-op Follow-up Note (Signed)
Anesthesia QCDR form completed.        

## 2018-11-12 NOTE — Anesthesia Postprocedure Evaluation (Signed)
Anesthesia Post Note  Patient: SALEEMA BRECEDA  Procedure(s) Performed: ESOPHAGOGASTRODUODENOSCOPY (EGD) WITH PROPOFOL (N/A )  Patient location during evaluation: PACU Anesthesia Type: General Level of consciousness: awake and alert Pain management: pain level controlled Vital Signs Assessment: post-procedure vital signs reviewed and stable Respiratory status: spontaneous breathing, nonlabored ventilation, respiratory function stable and patient connected to nasal cannula oxygen Cardiovascular status: blood pressure returned to baseline and stable Postop Assessment: no apparent nausea or vomiting Anesthetic complications: no     Last Vitals:  Vitals:   11/12/18 0915 11/12/18 0925  BP: (!) 142/78 (!) 147/79  Pulse: (!) 127   Resp: 13 14  Temp:    SpO2: (!) 88%     Last Pain:  Vitals:   11/12/18 0925  TempSrc:   PainSc: 0-No pain                 Yevette Edwards

## 2018-11-12 NOTE — Anesthesia Preprocedure Evaluation (Signed)
Anesthesia Evaluation  Patient identified by MRN, date of birth, ID band Patient awake    Reviewed: Allergy & Precautions, H&P , NPO status , Patient's Chart, lab work & pertinent test results, reviewed documented beta blocker date and time   History of Anesthesia Complications (+) history of anesthetic complications  Airway Mallampati: II   Neck ROM: full    Dental  (+) Poor Dentition   Pulmonary neg pulmonary ROS, former smoker,    Pulmonary exam normal        Cardiovascular Exercise Tolerance: Good hypertension, On Medications Normal cardiovascular exam Rhythm:regular Rate:Normal     Neuro/Psych PSYCHIATRIC DISORDERS Anxiety Depression negative neurological ROS     GI/Hepatic negative GI ROS, Neg liver ROS,   Endo/Other  negative endocrine ROS  Renal/GU negative Renal ROS  negative genitourinary   Musculoskeletal   Abdominal   Peds  Hematology  (+) Blood dyscrasia, anemia ,   Anesthesia Other Findings Past Medical History: No date: Anemia     Comment:  H/O  No date: Arthritis No date: Broken femur (HCC)     Comment:  left No date: Complication of anesthesia     Comment:  PT STATES SHE FORGETS TO BREATHE WHEN COMING OUT OF               ANESTHESIA No date: Hip pain     Comment:  left No date: Insomnia No date: Osteopenia No date: Osteoporosis No date: Thyroid disease Past Surgical History: No date: ANKLE SURGERY; Left No date: FOOT SURGERY; Left     Comment:  x3 12/21/2015: HARDWARE REMOVAL; Left     Comment:  Procedure: HARDWARE REMOVAL;  Surgeon: Juanell Fairly,               MD;  Location: [facility] ORS;  Service: Orthopedics;                Laterality: Left;  Left HIP  No date: HIP SURGERY; Left     Comment:  x2 No date: KNEE SURGERY; Right     Comment:  x2 No date: LEG SURGERY; Left     Comment:  x2 No date: TOTAL HIP ARTHROPLASTY; Right     Comment:  x4 BMI    Body Mass Index:  14.57  kg/m     Reproductive/Obstetrics negative OB ROS                             Anesthesia Physical Anesthesia Plan  ASA: III  Anesthesia Plan: General   Post-op Pain Management:    Induction:   PONV Risk Score and Plan:   Airway Management Planned:   Additional Equipment:   Intra-op Plan:   Post-operative Plan:   Informed Consent: I have reviewed the patients History and Physical, chart, labs and discussed the procedure including the risks, benefits and alternatives for the proposed anesthesia with the patient or authorized representative who has indicated his/her understanding and acceptance.   Dental Advisory Given  Plan Discussed with: CRNA  Anesthesia Plan Comments:         Anesthesia Quick Evaluation

## 2018-11-12 NOTE — Op Note (Signed)
[facility] Gastroenterology Patient Name: Nicole NicolasJanet Hietala Procedure Date: 11/12/2018 8:09 AM MRN: [id number] Account #: [id number] Date of Birth: 02/23/1946 Admit Type: Outpatient Age: 89 [facility] ROOM 4 Gender: Female Note Status: Finalized Procedure:            Upper GI endoscopy Indications:          Dysphagia Providers:            Wyline MoodKiran Jackalyn Haith MD, MD Referring MD:         Gabriel Cirriheryl Wicker (Referring MD) Medicines:            Monitored Anesthesia Care Complications:        No immediate complications. Procedure:            Pre-Anesthesia Assessment:                       - Prior to the procedure, a History and Physical was                        performed, and patient medications, allergies and                        sensitivities were reviewed. The patient's tolerance of                        previous anesthesia was reviewed.                       - The risks and benefits of the procedure and the                        sedation options and risks were discussed with the                        patient. All questions were answered and informed                        consent was obtained.                       - ASA Grade Assessment: II - A patient with mild                        systemic disease.                       After obtaining informed consent, the endoscope was                        passed under direct vision. Throughout the procedure,                        the patient's blood pressure, pulse, and oxygen                        saturations were monitored continuously. The Endoscope                        was introduced through the mouth, and advanced to the  third part of duodenum. The upper GI endoscopy was                        accomplished with ease. The patient tolerated the                        procedure well. Findings:      The examined duodenum was normal.      The stomach was normal.      The cardia and gastric fundus  were normal on retroflexion.      One benign-appearing, intrinsic moderate (circumferential scarring or       stenosis; an endoscope may pass) stenosis was found at the       gastroesophageal junction. This stenosis measured less than one cm (in       length). The stenosis was traversed. A TTS dilator was passed through       the scope. Dilation with a 08-15-11 mm balloon dilator was performed to       11 mm. The dilation site was examined and showed no change.      Mucosal changes including ringed esophagus, small-caliber esophagus and       congestion (edema) were found in the entire esophagus. Esophageal       findings were graded using the Eosinophilic Esophagitis Endoscopic       Reference Score (EoE-EREFS) as: Edema Grade 1 Present (decreased clarity       or absence of vascular markings), Rings Grade 2 Moderate (distinct rings       that do not occlude passage of diagnostic 8-10 mm endoscope), Exudates       Grade 0 None (no white lesions seen), Furrows Grade 0 None (no vertical       lines seen) and Stricture present. Biopsies were obtained from the       proximal and distal esophagus with cold forceps for histology of       suspected eosinophilic esophagitis. Impression:           - Normal examined duodenum.                       - Normal stomach.                       - Benign-appearing esophageal stenosis. Dilated.                       - Esophageal mucosal changes suggestive of eosinophilic                        esophagitis. Biopsied. Recommendation:       - Discharge patient to home (with escort).                       - Resume previous diet.                       - Continue present medications.                       - Await pathology results.                       - Return to my office in 2 weeks. Procedure Code(s):    --- Professional ---  [id number], Esophagogastroduodenoscopy, flexible, transoral;                        with transendoscopic balloon  dilation of esophagus                        (less than 30 mm diameter)                       43239, 59, Esophagogastroduodenoscopy, flexible,                        transoral; with biopsy, single or multiple Diagnosis Code(s):    --- Professional ---                       K22.2, Esophageal obstruction                       K22.8, Other specified diseases of esophagus                       R13.10, Dysphagia, unspecified CPT copyright 2018 American Medical Association. All rights reserved. The codes documented in this report are preliminary and upon coder review may  be revised to meet current compliance requirements. [name], MD [name] MD, MD 11/12/2018 8:51:22 AM This report has been signed electronically. Number of Addenda: 0 Note Initiated On: 11/12/2018 8:09 AM      [facility]

## 2018-11-16 ENCOUNTER — Telehealth: Payer: Self-pay | Admitting: Gastroenterology

## 2018-11-16 LAB — SURGICAL PATHOLOGY

## 2018-11-16 NOTE — Telephone Encounter (Signed)
Ms Fredrick called wanting results of pathology from 11/12/18

## 2018-11-17 ENCOUNTER — Other Ambulatory Visit: Payer: Self-pay

## 2018-11-17 MED ORDER — OMEPRAZOLE 40 MG PO CPDR: 40.0000 mg | DELAYED_RELEASE_CAPSULE | Freq: Two times a day (BID) | ORAL | 1 refills | Status: DC

## 2018-11-17 NOTE — Telephone Encounter (Signed)
Inform biopsies showed chronic inflammation likely from reflux. Suiggest Omeprazole 40 mg BID- follow up with me in clinic in 2-4 weeks to discuss next steps

## 2018-11-17 NOTE — Telephone Encounter (Signed)
Spoke with pt and informed her of biopsy results ands Dr. Johnney Killian directions to take the omeprazole 40mg  twice daily and follow up in the office as scheduled.

## 2018-11-30 ENCOUNTER — Encounter: Payer: Self-pay | Admitting: Gastroenterology

## 2018-12-02 ENCOUNTER — Encounter: Payer: Self-pay | Admitting: Gastroenterology

## 2018-12-02 ENCOUNTER — Ambulatory Visit: Payer: Medicare Other | Admitting: Gastroenterology

## 2018-12-02 VITALS — BP 149/83 | HR 69 | Ht 67.0 in | Wt 94.0 lb

## 2018-12-02 DIAGNOSIS — K5900 Constipation, unspecified: Secondary | ICD-10-CM | POA: Diagnosis not present

## 2018-12-02 DIAGNOSIS — K222 Esophageal obstruction: Secondary | ICD-10-CM

## 2018-12-02 DIAGNOSIS — R1310 Dysphagia, unspecified: Secondary | ICD-10-CM | POA: Diagnosis not present

## 2018-12-02 NOTE — Progress Notes (Signed)
[name] MD, MRCP(U.K) [address]  [address]  [place], [place] [address]  Main: [phone]  Fax: [phone]   Primary Care Physician: Gabriel Cirri, NP  Primary Gastroenterologist:  Dr. [name]   Chief Complaint  Patient presents with  . Follow-up    Dysphagia    HPI: Nicole Butler is a 73 y.o. female    She was initially referred and seen on 10/29/18 for dysphagia ongoing for a year. Associated with odynophagia , mostly solid food dysphagia. Lost 13 lbs last 1 year. She has a bowel movement once a day - hard as she is on opiods. Symptoms are worse when she has not had a good bowel movement    Interval history   10/29/18 - 1/29//2020  11/10/2018 : Normal TSH,Hb 13 grams   11/12/2018 : EGD: One benign-appearing, intrinsic moderate (stenosis was found at the gastroesophageal junction. This stenosis measured less than one cm.The stenosis was traversed. A TTS dilator was passed through the scope. Dilation with a 08-15-11 mm balloon dilator was performed to 11 mm. TBx  of esophagus showed active chronic inflammation   Felt better after we increased dose of Prilosec. Last few days having some pain in her abdomen.Feels like a cramp. Feels food also gets stuck .  Refused to stand on our scales today .   Says weight stable. Still has constipaiton , miralax not working as well, refuses colonoscopy.   Current Outpatient Medications  Medication Sig Dispense Refill  . diclofenac sodium (VOLTAREN) 1 % GEL Apply topically 4 (four) times daily.    . Lidocaine HCl 4 % SOLN Apply topically.    . naproxen sodium (ALEVE) 220 MG tablet Take 220 mg by mouth. Takes twice daily    . omeprazole (PRILOSEC) 40 MG capsule Take 1 capsule (40 mg total) by mouth 2 (two) times daily. 180 capsule 1  . ondansetron (ZOFRAN) 4 MG tablet Take 1 tablet (4 mg total) by mouth daily as needed for nausea or vomiting. 20 tablet 6  . oxyCODONE-acetaminophen (PERCOCET/ROXICET) 5-325 MG tablet Take by  mouth every 8 (eight) hours as needed.     Marland Kitchen oxyCODONE-acetaminophen (PERCOCET/ROXICET) 5-325 MG tablet oxycodone-acetaminophen 5 mg-325 mg tablet    . polyethylene glycol (MIRALAX / GLYCOLAX) packet Take by mouth.    Marland Kitchen tiZANidine (ZANAFLEX) 2 MG tablet tizanidine 2 mg tablet    . traZODone (DESYREL) 50 MG tablet Take 2 tablets (100 mg total) at bedtime by mouth. 180 tablet 0  . mupirocin ointment (BACTROBAN) 2 % Place 1 application into the nose 2 (two) times daily. (Patient not taking: Reported on 06/17/2018) 22 g 0   No current facility-administered medications for this visit.     Allergies as of 12/02/2018 - Review Complete 12/02/2018  Allergen Reaction Noted  . Butrans [buprenorphine] Hives 06/11/2014  . Fosamax [alendronate sodium] Other (See Comments) 08/29/2015    ROS:  General: Negative for anorexia, weight loss, fever, chills, fatigue, weakness. ENT: Negative for hoarseness, difficulty swallowing , nasal congestion. CV: Negative for chest pain, angina, palpitations, dyspnea on exertion, peripheral edema.  Respiratory: Negative for dyspnea at rest, dyspnea on exertion, cough, sputum, wheezing.  GI: See history of present illness. GU:  Negative for dysuria, hematuria, urinary incontinence, urinary frequency, nocturnal urination.  Endo: Negative for unusual weight change.    Physical Examination:   BP (!) 149/83   Pulse 69   Ht 5\' 7"  (1.702 m)   Wt 94 lb (42.6 kg) Comment: Pt refused  to weigh in office  BMI 14.72 kg/m   General: very thin , well-developed in no acute distress.  Eyes: No icterus. Conjunctivae pink. Mouth: Oropharyngeal mucosa moist and pink , no lesions erythema or exudate. Lungs: Clear to auscultation bilaterally. Non-labored. Heart: Regular rate and rhythm, no murmurs rubs or gallops.  Abdomen: Bowel sounds are normal, nontender, nondistended, no hepatosplenomegaly or masses, no abdominal bruits or hernia , no rebound or guarding.   Extremities: No  lower extremity edema. No clubbing or deformities. Neuro: Alert and oriented x 3.  Grossly intact. Skin: Warm and dry, no jaundice.   Psych: Alert and cooperative, normal mood and affect.   Imaging Studies: No results found.  Assessment and Plan:   [name] is a 73 y.o. y/o female for follow up for dysphagia and Unintentional weight loss. She is not keen on a colonoscopy, she has never had one. Esophageal stricture dilated to 12 mm, feels better but stil has dysphagia  Plan   1.  EGD for dysphagia with aim to dilate to a target of 15 mm - she will call when she is ready to schedule - not to be scheduled today 2. Prilosec 40 mg BID  to treat reflux 3. Trial of linzess 145 mcg - samples provided  I have discussed alternative options, risks & benefits,  which include, but are not limited to, bleeding, infection, perforation,respiratory complication & drug reaction.  The patient agrees with this plan & written consent will be obtained.       Dr Wyline MoodKiran Edona Schreffler  MD,MRCP [facility](U.K) Follow up in 3 months

## 2018-12-07 ENCOUNTER — Other Ambulatory Visit: Payer: Self-pay

## 2018-12-07 ENCOUNTER — Telehealth: Payer: Self-pay

## 2018-12-07 DIAGNOSIS — K222 Esophageal obstruction: Secondary | ICD-10-CM

## 2018-12-07 DIAGNOSIS — R1310 Dysphagia, unspecified: Secondary | ICD-10-CM

## 2018-12-07 NOTE — Telephone Encounter (Signed)
Pt called to schedule her EGD procedure. Pt procedure has been scheduled for 12-17-18.

## 2018-12-14 ENCOUNTER — Other Ambulatory Visit: Payer: Self-pay

## 2018-12-14 ENCOUNTER — Telehealth: Payer: Self-pay | Admitting: Gastroenterology

## 2018-12-14 MED ORDER — LINACLOTIDE 145 MCG PO CAPS: 145.0000 ug | ORAL_CAPSULE | Freq: Every day | ORAL | 5 refills | Status: DC

## 2018-12-14 NOTE — Telephone Encounter (Signed)
Pt left vm she states DR. Tobi Bastos told her to call for a script of rx [place] she would like a call regarding this script

## 2018-12-14 NOTE — Telephone Encounter (Signed)
Prescription for Linzess was sent to [facility] per pt request.

## 2018-12-14 NOTE — Telephone Encounter (Signed)
Patient called in & would like a prescription for Linzess 145 mg called into her pharmacy [facility].She would like to pick it up around 1:00pm today.

## 2018-12-17 ENCOUNTER — Ambulatory Visit: Payer: Medicare Other | Admitting: Certified Registered Nurse Anesthetist

## 2018-12-17 ENCOUNTER — Encounter: Payer: Self-pay | Admitting: Student

## 2018-12-17 ENCOUNTER — Other Ambulatory Visit: Payer: Self-pay

## 2018-12-17 ENCOUNTER — Encounter
Admission: RE | Disposition: A | Discharge status: Home / Self Care | Payer: Self-pay | Source: Home / Self Care | Attending: Gastroenterology

## 2018-12-17 ENCOUNTER — Ambulatory Visit
Admission: RE | Admit: 2018-12-17 | Discharge: 2018-12-17 | Disposition: A | Discharge status: Home / Self Care | Payer: Medicare Other | Attending: Gastroenterology | Admitting: Gastroenterology

## 2018-12-17 DIAGNOSIS — M1990 Unspecified osteoarthritis, unspecified site: Secondary | ICD-10-CM | POA: Insufficient documentation

## 2018-12-17 DIAGNOSIS — K228 Other specified diseases of esophagus: Secondary | ICD-10-CM | POA: Diagnosis not present

## 2018-12-17 DIAGNOSIS — E079 Disorder of thyroid, unspecified: Secondary | ICD-10-CM | POA: Diagnosis not present

## 2018-12-17 DIAGNOSIS — F329 Major depressive disorder, single episode, unspecified: Secondary | ICD-10-CM | POA: Insufficient documentation

## 2018-12-17 DIAGNOSIS — R1310 Dysphagia, unspecified: Secondary | ICD-10-CM | POA: Diagnosis not present

## 2018-12-17 DIAGNOSIS — I10 Essential (primary) hypertension: Secondary | ICD-10-CM | POA: Diagnosis not present

## 2018-12-17 DIAGNOSIS — M8580 Other specified disorders of bone density and structure, unspecified site: Secondary | ICD-10-CM | POA: Diagnosis not present

## 2018-12-17 DIAGNOSIS — Z87891 Personal history of nicotine dependence: Secondary | ICD-10-CM | POA: Diagnosis not present

## 2018-12-17 DIAGNOSIS — F419 Anxiety disorder, unspecified: Secondary | ICD-10-CM | POA: Diagnosis not present

## 2018-12-17 DIAGNOSIS — Z79899 Other long term (current) drug therapy: Secondary | ICD-10-CM | POA: Insufficient documentation

## 2018-12-17 DIAGNOSIS — K222 Esophageal obstruction: Secondary | ICD-10-CM | POA: Diagnosis not present

## 2018-12-17 DIAGNOSIS — M810 Age-related osteoporosis without current pathological fracture: Secondary | ICD-10-CM | POA: Insufficient documentation

## 2018-12-17 DIAGNOSIS — K219 Gastro-esophageal reflux disease without esophagitis: Secondary | ICD-10-CM | POA: Diagnosis not present

## 2018-12-17 DIAGNOSIS — G4700 Insomnia, unspecified: Secondary | ICD-10-CM | POA: Insufficient documentation

## 2018-12-17 HISTORY — DX: Gastro-esophageal reflux disease without esophagitis: K21.9

## 2018-12-17 HISTORY — PX: ESOPHAGOGASTRODUODENOSCOPY (EGD) WITH PROPOFOL: SHX5813

## 2018-12-17 SURGERY — ESOPHAGOGASTRODUODENOSCOPY (EGD) WITH PROPOFOL
Anesthesia: General

## 2018-12-17 MED ORDER — LABETALOL HCL 5 MG/ML IV SOLN
INTRAVENOUS | Status: AC
  Filled 2018-12-17: qty 4

## 2018-12-17 MED ORDER — PROPOFOL 10 MG/ML IV BOLUS
INTRAVENOUS | Status: DC | PRN
  Administered 2018-12-17 (×2): 20 mg via INTRAVENOUS
  Administered 2018-12-17: 50 mg via INTRAVENOUS

## 2018-12-17 MED ORDER — LABETALOL HCL 5 MG/ML IV SOLN
INTRAVENOUS | Status: DC | PRN
  Administered 2018-12-17: 5 mg via INTRAVENOUS

## 2018-12-17 MED ORDER — LIDOCAINE HCL (PF) 2 % IJ SOLN
INTRAMUSCULAR | Status: AC
  Filled 2018-12-17: qty 10

## 2018-12-17 MED ORDER — SODIUM CHLORIDE 0.9 % IV SOLN
INTRAVENOUS | Status: DC
  Administered 2018-12-17: 12:00:00 via INTRAVENOUS
  Administered 2018-12-17: 1000 mL via INTRAVENOUS

## 2018-12-17 MED ORDER — LIDOCAINE HCL (CARDIAC) PF 100 MG/5ML IV SOSY
PREFILLED_SYRINGE | INTRAVENOUS | Status: DC | PRN
  Administered 2018-12-17: 60 mg via INTRAVENOUS

## 2018-12-17 NOTE — Anesthesia Post-op Follow-up Note (Signed)
Anesthesia QCDR form completed.        

## 2018-12-17 NOTE — Anesthesia Preprocedure Evaluation (Signed)
Anesthesia Evaluation  Patient identified by MRN, date of birth, ID band Patient awake    Reviewed: Allergy & Precautions, H&P , NPO status , Patient's Chart, lab work & pertinent test results, reviewed documented beta blocker date and time   History of Anesthesia Complications (+) history of anesthetic complications  Airway Mallampati: II   Neck ROM: full    Dental  (+) Poor Dentition   Pulmonary neg pulmonary ROS, former smoker,    Pulmonary exam normal        Cardiovascular Exercise Tolerance: Good hypertension, On Medications Normal cardiovascular exam Rhythm:regular Rate:Normal     Neuro/Psych PSYCHIATRIC DISORDERS Anxiety Depression negative neurological ROS     GI/Hepatic negative GI ROS, Neg liver ROS,   Endo/Other  negative endocrine ROS  Renal/GU negative Renal ROS  negative genitourinary   Musculoskeletal   Abdominal   Peds  Hematology  (+) Blood dyscrasia, anemia ,   Anesthesia Other Findings Past Medical History: No date: Anemia     Comment:  H/O  No date: Arthritis No date: Broken femur (HCC)     Comment:  left No date: Complication of anesthesia     Comment:  PT STATES SHE FORGETS TO BREATHE WHEN COMING OUT OF               ANESTHESIA No date: Hip pain     Comment:  left No date: Insomnia No date: Osteopenia No date: Osteoporosis No date: Thyroid disease Past Surgical History: No date: ANKLE SURGERY; Left No date: FOOT SURGERY; Left     Comment:  x3 12/21/2015: HARDWARE REMOVAL; Left     Comment:  Procedure: HARDWARE REMOVAL;  Surgeon: Juanell Fairly,               MD;  Location: [facility] ORS;  Service: Orthopedics;                Laterality: Left;  Left HIP  No date: HIP SURGERY; Left     Comment:  x2 No date: KNEE SURGERY; Right     Comment:  x2 No date: LEG SURGERY; Left     Comment:  x2 No date: TOTAL HIP ARTHROPLASTY; Right     Comment:  x4 BMI    Body Mass Index:  14.57  kg/m     Reproductive/Obstetrics negative OB ROS                             Anesthesia Physical  Anesthesia Plan  ASA: III  Anesthesia Plan: General   Post-op Pain Management:    Induction: Intravenous  PONV Risk Score and Plan: 3 and Propofol infusion and TIVA  Airway Management Planned: Natural Airway and Nasal Cannula  Additional Equipment:   Intra-op Plan:   Post-operative Plan:   Informed Consent: I have reviewed the patients History and Physical, chart, labs and discussed the procedure including the risks, benefits and alternatives for the proposed anesthesia with the patient or authorized representative who has indicated his/her understanding and acceptance.     Dental Advisory Given  Plan Discussed with: CRNA  Anesthesia Plan Comments:         Anesthesia Quick Evaluation

## 2018-12-17 NOTE — Op Note (Signed)
[facility] Gastroenterology Patient Name: Nicole Butler Procedure Date: 12/17/2018 11:23 AM MRN: [id number] Account #: [id number] Date of Birth: [birth year] Admit Type: Outpatient Age: 73 Room: [facility] ENDO ROOM 4 Gender: Female Note Status: Finalized Procedure:            Upper GI endoscopy Indications:          Follow-up of esophageal stenosis Providers:            [name] MD, MD Referring MD:         Gabriel Cirri (Referring MD) Medicines:            Monitored Anesthesia Care Complications:        No immediate complications. Procedure:            Pre-Anesthesia Assessment:                       - Prior to the procedure, a History and Physical was                        performed, and patient medications, allergies and                        sensitivities were reviewed. The patient's tolerance of                        previous anesthesia was reviewed.                       - The risks and benefits of the procedure and the                        sedation options and risks were discussed with the                        patient. All questions were answered and informed                        consent was obtained.                       - ASA Grade Assessment: II - A patient with mild                        systemic disease.                       After obtaining informed consent, the endoscope was                        passed under direct vision. Throughout the procedure,                        the patient's blood pressure, pulse, and oxygen                        saturations were monitored continuously. The Endoscope                        was introduced through the mouth, and advanced to the  third part of duodenum. The upper GI endoscopy was                        accomplished with ease. The patient tolerated the                        procedure well. Findings:      One benign-appearing, intrinsic moderate (circumferential scarring or    stenosis; an endoscope may pass) stenosis was found at the       gastroesophageal junction. This stenosis measured 1.2 cm (inner       diameter) x less than one cm (in length). The stenosis was traversed. A       TTS dilator was passed through the scope. Dilation with a 12-13.5-15 mm       balloon dilator was performed to 15 mm. The dilation site was examined       and showed complete resolution of luminal narrowing.      Diffuse moderate mucosal changes characterized by sloughing, altered       texture and a decreased vascular pattern were found in the entire       esophagus. Biopsies were taken with a cold forceps for histology.      The stomach was normal.      The examined duodenum was normal.      The cardia and gastric fundus were normal on retroflexion. Impression:           - Benign-appearing esophageal stenosis. Dilated.                       - Texture changed, decreased vascular pattern mucosa in                        the esophagus. Biopsied.                       - Normal stomach.                       - Normal examined duodenum. Recommendation:       - Discharge patient to home (with escort).                       - Resume previous diet.                       - Continue present medications.                       - Await pathology results.                       - Return to my office in 6 weeks. Procedure Code(s):    --- Professional ---                       [id number], Esophagogastroduodenoscopy, flexible, transoral;                        with transendoscopic balloon dilation of esophagus                        (less than 30 mm diameter)  08676, 59, Esophagogastroduodenoscopy, flexible,                        transoral; with biopsy, single or multiple Diagnosis Code(s):    --- Professional ---                       K22.2, Esophageal obstruction                       K22.8, Other specified diseases of esophagus CPT copyright 2018 American Medical  Association. All rights reserved. The codes documented in this report are preliminary and upon coder review may  be revised to meet current compliance requirements. [name], MD [name] MD, MD 12/17/2018 11:43:41 AM This report has been signed electronically. Number of Addenda: 0 Note Initiated On: 12/17/2018 11:23 AM      [facility]

## 2018-12-17 NOTE — H&P (Signed)
Wyline MoodKiran Lashaya Kienitz, MD [address], [address], [place], [place], [address] [address], [address], [place], [place], [address] Phone: [phone]  Fax: [phone]  Primary Care Physician:  Gabriel CirriWicker, Cheryl, NP   Pre-Procedure History & Physical: HPI:  Nicole Butler is a 73 y.o. female is here for an endoscopy    Past Medical History:  Diagnosis Date  . Anemia    H/O   . Arthritis   . Broken femur (HCC)    left  . Complication of anesthesia    PT STATES SHE FORGETS TO BREATHE WHEN COMING OUT OF ANESTHESIA  . GERD (gastroesophageal reflux disease)   . Hip pain    left  . Insomnia   . Osteopenia   . Osteoporosis   . Thyroid disease     Past Surgical History:  Procedure Laterality Date  . ANKLE SURGERY Left   . ESOPHAGOGASTRODUODENOSCOPY (EGD) WITH PROPOFOL N/A 11/12/2018   Procedure: ESOPHAGOGASTRODUODENOSCOPY (EGD) WITH PROPOFOL;  Surgeon: Wyline MoodAnna, Shavaughn Seidl, MD;  [facility];  Service: Gastroenterology;  Laterality: N/A;  . FOOT SURGERY Left    x3  . HARDWARE REMOVAL Left 12/21/2015   Procedure: HARDWARE REMOVAL;  Surgeon: Juanell FairlyKevin Krasinski, MD;  Location: [facility] ORS;  Service: Orthopedics;  Laterality: Left;  Left HIP   . HIP SURGERY Left    x2  . KNEE SURGERY Right    x2  . LEG SURGERY Left    x2  . TOTAL HIP ARTHROPLASTY Right    x4    Prior to Admission medications   Medication Sig Start Date End Date Taking? Authorizing Provider  linaclotide Karlene Einstein(LINZESS) 145 MCG CAPS capsule Take 1 capsule (145 mcg total) by mouth daily before breakfast. 12/14/18  Yes Wyline MoodAnna, Diann Bangerter, MD  naproxen sodium (ALEVE) 220 MG tablet Take 220 mg by mouth. Takes twice daily   Yes [provider]  omeprazole (PRILOSEC) 40 MG capsule Take 1 capsule (40 mg total) by mouth 2 (two) times daily. 11/17/18  Yes Wyline MoodAnna, Vila Dory, MD  ondansetron (ZOFRAN) 4 MG tablet Take 1 tablet (4 mg total) by mouth daily as needed for nausea or vomiting. 03/26/17  Yes Johnson, Megan P, DO    oxyCODONE-acetaminophen (PERCOCET/ROXICET) 5-325 MG tablet Take by mouth every 8 (eight) hours as needed.  01/09/17  Yes [provider]  tiZANidine (ZANAFLEX) 2 MG tablet tizanidine 2 mg tablet 06/12/18  Yes [provider]  traZODone (DESYREL) 50 MG tablet Take 2 tablets (100 mg total) at bedtime by mouth. 09/23/17  Yes Gabriel CirriWicker, Cheryl, NP  diclofenac sodium (VOLTAREN) 1 % GEL Apply topically 4 (four) times daily.    [provider]  Lidocaine HCl 4 % SOLN Apply topically.    [provider]  mupirocin ointment (BACTROBAN) 2 % Place 1 application into the nose 2 (two) times daily. Patient not taking: Reported on 06/17/2018 08/27/17   Gabriel CirriWicker, Cheryl, NP  oxyCODONE-acetaminophen (PERCOCET/ROXICET) 5-325 MG tablet oxycodone-acetaminophen 5 mg-325 mg tablet 06/12/18   [provider]  polyethylene glycol (MIRALAX / GLYCOLAX) packet Take by mouth.    [provider]    Allergies as of 12/07/2018 - Review Complete 12/02/2018  Allergen Reaction Noted  . Butrans [buprenorphine] Hives 06/11/2014  . Fosamax [alendronate sodium] Other (See Comments) 08/29/2015    Family History  Problem Relation Age of Onset  . Alzheimer's disease Mother   . Cancer Father        lung    Social History   Socioeconomic History  .  Marital status: Significant Other    Spouse name: Not on file  . Number of children: Not on file  . Years of education: Not on file  . Highest education level: Associate degree: academic program  Occupational History  . Not on file  Social Needs  . Financial resource strain: Not hard at all  . Food insecurity:    Worry: Never true    Inability: Never true  . Transportation needs:    Medical: No    Non-medical: No  Tobacco Use  . Smoking status: Former Smoker    Packs/day: 1.00    Years: 10.00    Pack years: 10.00    Types: Cigarettes    Last attempt to quit: 12/15/1995    Years since quitting: 23.0  . Smokeless tobacco:  Never Used  Substance and Sexual Activity  . Alcohol use: Yes    Alcohol/week: 8.0 standard drinks    Types: 8 Glasses of wine per week    Comment: 2 GLASSES EVERY EVENING  . Drug use: No  . Sexual activity: Not on file  Lifestyle  . Physical activity:    Days per week: 0 days    Minutes per session: 0 min  . Stress: Not at all  Relationships  . Social connections:    Talks on phone: More than three times a week    Gets together: More than three times a week    Attends religious service: Never    Active member of club or organization: No    Attends meetings of clubs or organizations: Never    Relationship status: Living with partner  . Intimate partner violence:    Fear of current or ex partner: No    Emotionally abused: No    Physically abused: No    Forced sexual activity: No  Other Topics Concern  . Not on file  Social History Narrative   Sees partner 3 times a week, rotates between houses    Review of Systems: See HPI, otherwise negative ROS  Physical Exam: BP (!) 153/75   Pulse 62   Temp (!) 97 F (36.1 C) (Tympanic)   Resp 16   Ht 5\' 7"  (1.702 m)   Wt 40.8 kg   SpO2 98%   BMI 14.10 kg/m  General:   Alert,  pleasant and cooperative in NAD Head:  Normocephalic and atraumatic. Neck:  Supple; no masses or thyromegaly. Lungs:  Clear throughout to auscultation, normal respiratory effort.    Heart:  +S1, +S2, Regular rate and rhythm, No edema. Abdomen:  Soft, nontender and nondistended. Normal bowel sounds, without guarding, and without rebound.   Neurologic:  Alert and  oriented x4;  grossly normal neurologically.  Impression/Plan: Nicole Butler is here for an endoscopy  to be performed for  evaluation of dysphagia    Risks, benefits, limitations, and alternatives regarding endoscopy and dilation have been reviewed with the patient.  Questions have been answered.  All parties agreeable.   [name], MD  12/17/2018, 11:28 AM

## 2018-12-17 NOTE — Transfer of Care (Signed)
Immediate Anesthesia Transfer of Care Note  Patient: Nicole Butler  Procedure(s) Performed: ESOPHAGOGASTRODUODENOSCOPY (EGD) WITH PROPOFOL with Dilation (N/A )  Patient Location: PACU and Endoscopy Unit  Anesthesia Type:General  Level of Consciousness: awake, alert , oriented and patient cooperative  Airway & Oxygen Therapy: Patient Spontanous Breathing and Patient connected to face mask oxygen  Post-op Assessment: Report given to RN, Post -op Vital signs reviewed and stable and Patient moving all extremities  Post vital signs: Reviewed and stable  Last Vitals:  Vitals Value Taken Time  BP 132/77 12/17/2018 11:52 AM  Temp 36.2 C 12/17/2018 11:51 AM  Pulse 66 12/17/2018 11:56 AM  Resp 10 12/17/2018 11:56 AM  SpO2 100 % 12/17/2018 11:56 AM  Vitals shown include unvalidated device data.  Last Pain:  Vitals:   12/17/18 1151  TempSrc: Tympanic  PainSc: 0-No pain         Complications: No apparent anesthesia complications

## 2018-12-18 ENCOUNTER — Encounter: Payer: Self-pay | Admitting: Gastroenterology

## 2018-12-18 NOTE — Anesthesia Postprocedure Evaluation (Signed)
Anesthesia Post Note  Patient: Nicole Butler  Procedure(s) Performed: ESOPHAGOGASTRODUODENOSCOPY (EGD) WITH PROPOFOL with Dilation (N/A )  Patient location during evaluation: Endoscopy Anesthesia Type: General Level of consciousness: awake and alert Pain management: pain level controlled Vital Signs Assessment: post-procedure vital signs reviewed and stable Respiratory status: spontaneous breathing, nonlabored ventilation, respiratory function stable and patient connected to nasal cannula oxygen Cardiovascular status: blood pressure returned to baseline and stable Postop Assessment: no apparent nausea or vomiting Anesthetic complications: no     Last Vitals:  Vitals:   12/17/18 1211 12/17/18 1221  BP: (!) 146/71 (!) 145/67  Pulse:    Resp:    Temp:    SpO2:      Last Pain:  Vitals:   12/17/18 1221  TempSrc:   PainSc: 0-No pain                 Lenard Simmer

## 2018-12-21 ENCOUNTER — Encounter: Payer: Self-pay | Admitting: Gastroenterology

## 2018-12-21 LAB — SURGICAL PATHOLOGY

## 2019-02-03 ENCOUNTER — Ambulatory Visit: Payer: Medicare Other | Admitting: Gastroenterology

## 2019-02-09 ENCOUNTER — Ambulatory Visit: Payer: Medicare Other | Admitting: Gastroenterology

## 2019-06-21 ENCOUNTER — Telehealth: Payer: Self-pay | Admitting: Gastroenterology

## 2019-06-21 NOTE — Telephone Encounter (Signed)
Patient  called & states she is having trouble swallowing again. She has had 2 endoscopies  & is thinking she may need another one. She is asking if this is going to be an own going thing. Please advise.

## 2019-06-22 NOTE — Telephone Encounter (Signed)
Inform the last time when we did her endoscopy we went only up to 12 mm.  I did tell her that she we need to ideally go further higher and dilated at least until 15 mm.  I suspect that she has had further narrowing of the stricture.  I suggested we perform EGD and dilate.  If she would want lasting relief from this she would need a few rounds of the dilation at short intervals to ensure we get to the right diameter.  If she is willing to go ahead for endoscopy and dilation please schedule if she wishes to discuss with me at the office schedule office visit

## 2019-06-24 ENCOUNTER — Ambulatory Visit (INDEPENDENT_AMBULATORY_CARE_PROVIDER_SITE_OTHER): Payer: Medicare Other | Admitting: Gastroenterology

## 2019-06-24 DIAGNOSIS — R1310 Dysphagia, unspecified: Secondary | ICD-10-CM

## 2019-06-24 DIAGNOSIS — K222 Esophageal obstruction: Secondary | ICD-10-CM | POA: Diagnosis not present

## 2019-06-24 NOTE — Progress Notes (Signed)
Jonathon Bellows , MD [address]  [place]  [place],  [address]  Main: [phone]  Fax: [phone]   Primary Care Physician: Kathrine Haddock, NP  Virtual Visit via Telephone Note  I connected with patient on 06/24/19 at 11:00 AM EDT by telephone and verified that I am speaking with the correct person using two identifiers.   I discussed the limitations, risks, security and privacy concerns of performing an evaluation and management service by telephone and the availability of in person appointments. I also discussed with the patient that there may be a patient responsible charge related to this service. The patient expressed understanding and agreed to proceed.  Location of Patient: Home Location of Provider: Home Persons involved: Patient and provider only   History of Present Illness: Dysphagia HPI: Nicole Butler is a 73 y.o. female   She was initially referred and seen on 10/29/18 for dysphagia ongoing for a year. Associated with odynophagia , mostly solid food dysphagia. Lost 13 lbs last 1 year. She has a bowel movement once a day - hard as she is on opiods. 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 This stenosis measured less than one cm.The stenosis was traversed. A TTS dilator was passed through the scope. Dilation with a 08-15-11 mm balloon dilator was performed to 11 mm. 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  Biopsies of the esophagus showed no eosinophilia  She states that after her last dilation she felt great for a while gradually the dysphagia has returned and feels that she needs to be stretched again.    Current Outpatient Medications  Medication Sig Dispense  Refill  . diclofenac sodium (VOLTAREN) 1 % GEL Apply topically 4 (four) times daily.    . Lidocaine HCl 4 % SOLN Apply topically.    Marland Kitchen linaclotide (LINZESS) 145 MCG CAPS capsule Take 1 capsule (145 mcg total) by mouth daily before breakfast. 30 capsule 5  . mupirocin ointment (BACTROBAN) 2 % Place 1 application into the nose 2 (two) times daily. (Patient not taking: Reported on 06/17/2018) 22 g 0  . naproxen sodium (ALEVE) 220 MG tablet Take 220 mg by mouth. Takes twice daily    . omeprazole (PRILOSEC) 40 MG capsule Take 1 capsule (40 mg total) by mouth 2 (two) times daily. 180 capsule 1  . ondansetron (ZOFRAN) 4 MG tablet Take 1 tablet (4 mg total) by mouth daily as needed for nausea or vomiting. 20 tablet 6  . oxyCODONE-acetaminophen (PERCOCET/ROXICET) 5-325 MG tablet Take by mouth every 8 (eight) hours as needed.     Marland Kitchen oxyCODONE-acetaminophen (PERCOCET/ROXICET) 5-325 MG tablet oxycodone-acetaminophen 5 mg-325 mg tablet    . polyethylene glycol (MIRALAX / GLYCOLAX) packet Take by mouth.    Marland Kitchen tiZANidine (ZANAFLEX) 2 MG tablet tizanidine 2 mg tablet    . traZODone (DESYREL) 50 MG tablet Take 2 tablets (100 mg total) at bedtime by mouth. 180 tablet 0   No current facility-administered medications for this visit.     Allergies as of 06/24/2019 - Review Complete 12/17/2018  Allergen Reaction Noted  . Butrans [buprenorphine] Hives 06/11/2014  . Fosamax [alendronate sodium] Other (See Comments) 08/29/2015    Review of Systems:    All systems reviewed  and negative except where noted in HPI.   Observations/Objective:  Labs: CMP     Component Value Date/Time   NA 128 (L) 04/24/2017 1117   NA 131 (L) 09/05/2014 0419   K 4.7 04/24/2017 1117   K 4.6 09/05/2014 0419   CL 89 (L) 04/24/2017 1117   CL 99 09/05/2014 0419   CO2 23 04/24/2017 1117   CO2 25 09/05/2014 0419   GLUCOSE 79 04/24/2017 1117   GLUCOSE 98 02/12/2016 0325   GLUCOSE 120 (H) 09/05/2014 0419   BUN 7 (L) 04/24/2017 1117    BUN 6 (L) 09/05/2014 0419   CREATININE 0.46 (L) 04/24/2017 1117   CREATININE 0.47 (L) 09/05/2014 0419   CALCIUM 9.4 04/24/2017 1117   CALCIUM 7.9 (L) 09/05/2014 0419   PROT 6.7 04/16/2017 1029   PROT 6.7 09/03/2014 2237   ALBUMIN 4.3 04/16/2017 1029   ALBUMIN 3.7 09/03/2014 2237   AST 23 04/16/2017 1029   AST 29 09/03/2014 2237   ALT 12 04/16/2017 1029   ALT 19 09/03/2014 2237   ALKPHOS 48 04/16/2017 1029   ALKPHOS 77 09/03/2014 2237   BILITOT 0.4 04/16/2017 1029   BILITOT 0.2 09/03/2014 2237   GFRNONAA 102 04/24/2017 1117   GFRNONAA >60 09/05/2014 0419   GFRAA 117 04/24/2017 1117   GFRAA >60 09/05/2014 0419   Lab Results  Component Value Date   WBC 7.3 11/10/2018   HGB 13.1 11/10/2018   HCT 37.4 11/10/2018   MCV 94 11/10/2018   PLT 342 11/10/2018    Imaging Studies: No results found.  Assessment and Plan:   Nicole Butler is a 73 y.o. y/o female here to follow-up for dysphagia.  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  Prior to which I had stretched it to 12 mm.  She is having recurrence of dysphagia and I suspect she has developed further stenosis.  Plan   1.  EGD and possible injection of steroid post dilation.   I have discussed alternative options, risks & benefits,  which include, but are not limited to, bleeding, infection, perforation,respiratory complication & drug reaction.  The patient agrees with this plan & written consent will be obtained.  '    I discussed the assessment and treatment plan with the patient. The patient was provided an opportunity to ask questions and all were answered. The patient agreed with the plan and demonstrated an understanding of the instructions.   The patient was advised to call back or seek an in-person evaluation if the symptoms worsen or if the condition fails to improve as anticipated.  I provided 12 minutes of non-face-to-face time during this encounter.  Dr Wyline MoodKiran Hurshell Dino MD,MRCP [facility]  Gastroenterology/Hepatology Pager: [phone]   Speech recognition software was used to dictate this note.

## 2019-06-29 ENCOUNTER — Other Ambulatory Visit: Payer: Self-pay

## 2019-06-29 DIAGNOSIS — R1310 Dysphagia, unspecified: Secondary | ICD-10-CM

## 2019-06-29 DIAGNOSIS — K222 Esophageal obstruction: Secondary | ICD-10-CM

## 2019-07-01 ENCOUNTER — Telehealth: Payer: Self-pay

## 2019-07-01 NOTE — Telephone Encounter (Signed)
Patients EGD has been rescheduled from 07/08/19 to 07/15/19 with Dr. Vicente Males due to [facility] Endo staffing.  Pt advised to have COVID test on 07/12/19.  Thanks,  Sharyn Lull

## 2019-07-06 ENCOUNTER — Telehealth: Payer: Self-pay

## 2019-07-06 NOTE — Telephone Encounter (Signed)
Spoke with pt and informed her that she would need to go for the COVID test on 07-13-19 instead of 07-12-19 due to the holiday. Pt understands and agrees.

## 2019-07-13 ENCOUNTER — Other Ambulatory Visit: Payer: Self-pay

## 2019-07-13 ENCOUNTER — Other Ambulatory Visit
Admission: RE | Admit: 2019-07-13 | Discharge: 2019-07-13 | Disposition: A | Discharge status: Home / Self Care | Payer: Medicare Other | Source: Ambulatory Visit | Attending: Gastroenterology | Admitting: Gastroenterology

## 2019-07-13 DIAGNOSIS — Z20828 Contact with and (suspected) exposure to other viral communicable diseases: Secondary | ICD-10-CM | POA: Insufficient documentation

## 2019-07-13 DIAGNOSIS — Z01812 Encounter for preprocedural laboratory examination: Secondary | ICD-10-CM | POA: Diagnosis not present

## 2019-07-14 LAB — SARS CORONAVIRUS 2 (TAT 6-24 HRS): SARS Coronavirus 2: NEGATIVE

## 2019-07-15 ENCOUNTER — Encounter: Payer: Self-pay | Admitting: Certified Registered Nurse Anesthetist

## 2019-07-15 ENCOUNTER — Ambulatory Visit
Admission: RE | Admit: 2019-07-15 | Discharge: 2019-07-15 | Disposition: A | Discharge status: Home / Self Care | Payer: Medicare Other | Attending: Gastroenterology | Admitting: Gastroenterology

## 2019-07-15 ENCOUNTER — Ambulatory Visit: Payer: Medicare Other | Admitting: Certified Registered"

## 2019-07-15 ENCOUNTER — Encounter
Admission: RE | Disposition: A | Discharge status: Home / Self Care | Payer: Self-pay | Source: Home / Self Care | Attending: Gastroenterology

## 2019-07-15 ENCOUNTER — Other Ambulatory Visit: Payer: Self-pay

## 2019-07-15 DIAGNOSIS — Z87891 Personal history of nicotine dependence: Secondary | ICD-10-CM | POA: Insufficient documentation

## 2019-07-15 DIAGNOSIS — I10 Essential (primary) hypertension: Secondary | ICD-10-CM | POA: Diagnosis not present

## 2019-07-15 DIAGNOSIS — Z79899 Other long term (current) drug therapy: Secondary | ICD-10-CM | POA: Insufficient documentation

## 2019-07-15 DIAGNOSIS — R1310 Dysphagia, unspecified: Secondary | ICD-10-CM | POA: Insufficient documentation

## 2019-07-15 DIAGNOSIS — Z96641 Presence of right artificial hip joint: Secondary | ICD-10-CM | POA: Diagnosis not present

## 2019-07-15 DIAGNOSIS — K219 Gastro-esophageal reflux disease without esophagitis: Secondary | ICD-10-CM | POA: Insufficient documentation

## 2019-07-15 DIAGNOSIS — K222 Esophageal obstruction: Secondary | ICD-10-CM | POA: Diagnosis not present

## 2019-07-15 HISTORY — PX: ESOPHAGOGASTRODUODENOSCOPY (EGD) WITH PROPOFOL: SHX5813

## 2019-07-15 SURGERY — ESOPHAGOGASTRODUODENOSCOPY (EGD) WITH PROPOFOL
Anesthesia: General

## 2019-07-15 MED ORDER — LIDOCAINE HCL (PF) 2 % IJ SOLN
INTRAMUSCULAR | Status: AC
  Filled 2019-07-15: qty 10

## 2019-07-15 MED ORDER — PROPOFOL 10 MG/ML IV BOLUS
INTRAVENOUS | Status: DC | PRN
  Administered 2019-07-15: 60 mg via INTRAVENOUS

## 2019-07-15 MED ORDER — SODIUM CHLORIDE 0.9 % IV SOLN
INTRAVENOUS | Status: DC
  Administered 2019-07-15: 08:00:00 via INTRAVENOUS

## 2019-07-15 MED ORDER — LIDOCAINE HCL (CARDIAC) PF 100 MG/5ML IV SOSY
PREFILLED_SYRINGE | INTRAVENOUS | Status: DC | PRN
  Administered 2019-07-15: 50 mg via INTRAVENOUS

## 2019-07-15 MED ORDER — HYDRALAZINE HCL 20 MG/ML IJ SOLN
10.0000 mg | Freq: Once | INTRAMUSCULAR | Status: AC
  Administered 2019-07-15: 10 mg via INTRAVENOUS

## 2019-07-15 MED ORDER — PROPOFOL 500 MG/50ML IV EMUL
INTRAVENOUS | Status: DC | PRN
  Administered 2019-07-15: 120 ug/kg/min via INTRAVENOUS

## 2019-07-15 MED ORDER — PROPOFOL 500 MG/50ML IV EMUL
INTRAVENOUS | Status: AC
  Filled 2019-07-15: qty 50

## 2019-07-15 NOTE — H&P (Signed)
Jonathon Bellows, MD [address] [place], [place], [place], [address] [address], [place], [place], [place], [address] Phone: [phone]  Fax: [phone]  Primary Care Physician:  Venita Lick, NP   Pre-Procedure History & Physical: HPI:  Nicole Butler is a 73 y.o. female is here for an endoscopy    Past Medical History:  Diagnosis Date  . Anemia    H/O   . Arthritis   . Broken femur ([place])    left  . Complication of anesthesia    PT STATES SHE FORGETS TO BREATHE WHEN COMING OUT OF ANESTHESIA  . GERD (gastroesophageal reflux disease)   . Hip pain    left  . Insomnia   . Osteopenia   . Osteoporosis   . Thyroid disease     Past Surgical History:  Procedure Laterality Date  . ANKLE SURGERY Left   . ESOPHAGOGASTRODUODENOSCOPY (EGD) WITH PROPOFOL N/A 11/12/2018   Procedure: ESOPHAGOGASTRODUODENOSCOPY (EGD) WITH PROPOFOL;  Surgeon: Jonathon Bellows, MD;  Location: [facility] ENDOSCOPY;  Service: Gastroenterology;  Laterality: N/A;  . ESOPHAGOGASTRODUODENOSCOPY (EGD) WITH PROPOFOL N/A 12/17/2018   Procedure: ESOPHAGOGASTRODUODENOSCOPY (EGD) WITH PROPOFOL with Dilation;  Surgeon: Jonathon Bellows, MD;  Location: [facility] ENDOSCOPY;  Service: Gastroenterology;  Laterality: N/A;  . FOOT SURGERY Left    x3  . HARDWARE REMOVAL Left 12/21/2015   Procedure: HARDWARE REMOVAL;  Surgeon: Thornton Park, MD;  Location: [facility] ORS;  Service: Orthopedics;  Laterality: Left;  Left HIP   . HIP SURGERY Left    x2  . KNEE SURGERY Right    x2  . LEG SURGERY Left    x2  . TOTAL HIP ARTHROPLASTY Right    x4    Prior to Admission medications   Medication Sig Start Date End Date Taking? Authorizing Provider  diclofenac sodium (VOLTAREN) 1 % GEL Apply topically 4 (four) times daily.   Yes [provider]  Lidocaine HCl 4 % SOLN Apply topically.   Yes [provider]  naproxen sodium (ALEVE) 220 MG tablet Take 220 mg by mouth. Takes twice daily   Yes [provider]   omeprazole (PRILOSEC) 40 MG capsule Take 1 capsule (40 mg total) by mouth 2 (two) times daily. 11/17/18  Yes Jonathon Bellows, MD  ondansetron (ZOFRAN) 4 MG tablet Take 1 tablet (4 mg total) by mouth daily as needed for nausea or vomiting. 03/26/17  Yes Wynetta Emery, Megan P, DO  oxyCODONE-acetaminophen (PERCOCET/ROXICET) 5-325 MG tablet oxycodone-acetaminophen 5 mg-325 mg tablet 06/12/18  Yes [provider]  polyethylene glycol (MIRALAX / GLYCOLAX) packet Take by mouth.   Yes [provider]  tiZANidine (ZANAFLEX) 2 MG tablet tizanidine 2 mg tablet 06/12/18  Yes [provider]  traZODone (DESYREL) 50 MG tablet Take 2 tablets (100 mg total) at bedtime by mouth. 09/23/17  Yes Kathrine Haddock, NP  linaclotide Rolan Lipa) 145 MCG CAPS capsule Take 1 capsule (145 mcg total) by mouth daily before breakfast. Patient not taking: Reported on 07/15/2019 12/14/18   Jonathon Bellows, MD  mupirocin ointment (BACTROBAN) 2 % Place 1 application into the nose 2 (two) times daily. Patient not taking: Reported on 06/17/2018 08/27/17   Kathrine Haddock, NP  oxyCODONE-acetaminophen (PERCOCET/ROXICET) 5-325 MG tablet Take by mouth every 8 (eight) hours as needed.  01/09/17   [provider]    Allergies as of 06/29/2019 - Review Complete 12/17/2018  Allergen Reaction Noted  . Butrans [buprenorphine] Hives 06/11/2014  . Fosamax [alendronate sodium] Other (See Comments) 08/29/2015  Family History  Problem Relation Age of Onset  . Alzheimer's disease Mother   . Cancer Father        lung    Social History   Socioeconomic History  . Marital status: Significant Other    Spouse name: Not on file  . Number of children: Not on file  . Years of education: Not on file  . Highest education level: Associate degree: academic program  Occupational History  . Not on file  Social Needs  . Financial resource strain: Not hard at all  . Food insecurity    Worry: Never true    Inability: Never true  .  Transportation needs    Medical: No    Non-medical: No  Tobacco Use  . Smoking status: Former Smoker    Packs/day: 1.00    Years: 10.00    Pack years: 10.00    Types: Cigarettes    Quit date: 12/15/1995    Years since quitting: 23.5  . Smokeless tobacco: Never Used  Substance and Sexual Activity  . Alcohol use: Yes    Alcohol/week: 8.0 standard drinks    Types: 8 Glasses of wine per week    Comment: 2 GLASSES EVERY EVENING  . Drug use: No  . Sexual activity: Not on file  Lifestyle  . Physical activity    Days per week: 0 days    Minutes per session: 0 min  . Stress: Not at all  Relationships  . Social connections    Talks on phone: More than three times a week    Gets together: More than three times a week    Attends religious service: Never    Active member of club or organization: No    Attends meetings of clubs or organizations: Never    Relationship status: Living with partner  . Intimate partner violence    Fear of current or ex partner: No    Emotionally abused: No    Physically abused: No    Forced sexual activity: No  Other Topics Concern  . Not on file  Social History Narrative   Sees partner 3 times a week, rotates between houses    Review of Systems: See HPI, otherwise negative ROS  Physical Exam: BP (!) 147/69   Pulse 60   Temp (!) 96.8 F (36 C) (Tympanic)   Resp 16   Ht 5\' 7"  (1.702 m)   Wt 41.3 kg   SpO2 95%   BMI 14.25 kg/m  General:   Alert,  pleasant and cooperative in NAD Head:  Normocephalic and atraumatic. Neck:  Supple; no masses or thyromegaly. Lungs:  Clear throughout to auscultation, normal respiratory effort.    Heart:  +S1, +S2, Regular rate and rhythm, No edema. Abdomen:  Soft, nontender and nondistended. Normal bowel sounds, without guarding, and without rebound.   Neurologic:  Alert and  oriented x4;  grossly normal neurologically.  Impression/Plan: Selmer DominionJanet S Butler is here for an endoscopy  to be performed for  evaluation of  dysphagia    Risks, benefits, limitations, and alternatives regarding endoscopy and dilation  have been reviewed with the patient.  Questions have been answered.  All parties agreeable.   Wyline MoodKiran Quamere Mussell, MD  07/15/2019, 7:59 AM

## 2019-07-15 NOTE — Anesthesia Postprocedure Evaluation (Signed)
Anesthesia Post Note  Patient: Nicole Butler  Procedure(s) Performed: ESOPHAGOGASTRODUODENOSCOPY (EGD) WITH PROPOFOL with Dilation (N/A )  Patient location during evaluation: PACU Anesthesia Type: General Level of consciousness: awake and alert Pain management: pain level controlled Vital Signs Assessment: post-procedure vital signs reviewed and stable Respiratory status: spontaneous breathing, nonlabored ventilation and respiratory function stable Cardiovascular status: blood pressure returned to baseline and stable Postop Assessment: no apparent nausea or vomiting Anesthetic complications: no     Last Vitals:  Vitals:   07/15/19 0851 07/15/19 0900  BP: (!) 177/104 (!) 158/94  Pulse:  78  Resp:  18  Temp:    SpO2:  100%    Last Pain:  Vitals:   07/15/19 0820  TempSrc: Tympanic  PainSc:                  [name]

## 2019-07-15 NOTE — Anesthesia Preprocedure Evaluation (Addendum)
Anesthesia Evaluation  Patient identified by MRN, date of birth, ID band Patient awake    Reviewed: Allergy & Precautions, H&P , NPO status , Patient's Chart, lab work & pertinent test results  History of Anesthesia Complications Negative for: history of anesthetic complications  Airway Mallampati: II  TM Distance: >3 FB     Dental  (+) Edentulous Upper, Edentulous Lower   Pulmonary neg pulmonary ROS, neg sleep apnea, neg recent URI, former smoker,           Cardiovascular hypertension, (-) angina(-) Past MI and (-) Cardiac Stents (-) dysrhythmias      Neuro/Psych PSYCHIATRIC DISORDERS Anxiety Depression negative neurological ROS     GI/Hepatic Neg liver ROS, GERD  Controlled,  Endo/Other  negative endocrine ROS  Renal/GU negative Renal ROS  negative genitourinary   Musculoskeletal   Abdominal   Peds  Hematology  (+) Blood dyscrasia, anemia ,   Anesthesia Other Findings Past Medical History: No date: Anemia     Comment:  H/O  No date: Arthritis No date: Broken femur ([facility])     Comment:  left No date: Complication of anesthesia     Comment:  PT STATES SHE FORGETS TO BREATHE WHEN COMING OUT OF               ANESTHESIA No date: GERD (gastroesophageal reflux disease) No date: Hip pain     Comment:  left No date: Insomnia No date: Osteopenia No date: Osteoporosis No date: Thyroid disease  Past Surgical History: No date: ANKLE SURGERY; Left 11/12/2018: ESOPHAGOGASTRODUODENOSCOPY (EGD) WITH PROPOFOL; N/A     Comment:  Procedure: ESOPHAGOGASTRODUODENOSCOPY (EGD) WITH               PROPOFOL;  Surgeon: Jonathon Bellows, MD;  Location: [facility]               ENDOSCOPY;  Service: Gastroenterology;  Laterality: N/A; 12/17/2018: ESOPHAGOGASTRODUODENOSCOPY (EGD) WITH PROPOFOL; N/A     Comment:  Procedure: ESOPHAGOGASTRODUODENOSCOPY (EGD) WITH               PROPOFOL with Dilation;  Surgeon: Jonathon Bellows, MD;    [facility] END[facility]COPY;  Service: Gastroenterology;                Laterality: N/A; No date: FOOT SURGERY; Left     Comment:  x3 12/21/2015: HARDWARE REMOVAL; Left     Comment:  Procedure: HARDWARE REMOVAL;  Surgeon: Thornton Park,               MD;  Location: [facility] ORS;  Service: Orthopedics;                Laterality: Left;  Left HIP  No date: HIP SURGERY; Left     Comment:  x2 No date: KNEE SURGERY; Right     Comment:  x2 No date: LEG SURGERY; Left     Comment:  x2 No date: TOTAL HIP ARTHROPLASTY; Right     Comment:  x4  BMI    Body Mass Index: 14.25 kg/m      Reproductive/Obstetrics negative OB ROS                            Anesthesia Physical Anesthesia Plan  ASA: II  Anesthesia Plan: General   Post-op Pain Management:    Induction:   PONV Risk Score and Plan: Propofol infusion and TIVA  Airway Management Planned: Natural Airway and Nasal Cannula  Additional Equipment:  Intra-op Plan:   Post-operative Plan:   Informed Consent: I have reviewed the patients History and Physical, chart, labs and discussed the procedure including the risks, benefits and alternatives for the proposed anesthesia with the patient or authorized representative who has indicated his/her understanding and acceptance.     Dental Advisory Given  Plan Discussed with: Anesthesiologist and CRNA  Anesthesia Plan Comments:         Anesthesia Quick Evaluation

## 2019-07-15 NOTE — Anesthesia Post-op Follow-up Note (Signed)
Anesthesia QCDR form completed.        

## 2019-07-15 NOTE — Op Note (Signed)
[facility] Gastroenterology Patient Name: Boluwatife Hokett Procedure Date: 07/15/2019 7:35 AM MRN: [id number] Account #: [id number] Date of Birth: [birth year] Admit Type: Outpatient Age: 73 Room: [facility] ENDO ROOM 4 Gender: Female Note Status: Finalized Procedure:            Upper GI endoscopy Indications:          Dysphagia Providers:            [name] MD, MD Referring MD:         Edwin Cap (Referring MD) Medicines:            Monitored Anesthesia Care Complications:        No immediate complications. Procedure:            Pre-Anesthesia Assessment:                       - Prior to the procedure, a History and Physical was                        performed, and patient medications, allergies and                        sensitivities were reviewed. The patient's tolerance of                        previous anesthesia was reviewed.                       - The risks and benefits of the procedure and the                        sedation options and risks were discussed with the                        patient. All questions were answered and informed                        consent was obtained.                       - ASA Grade Assessment: II - A patient with mild                        systemic disease.                       After obtaining informed consent, the endoscope was                        passed under direct vision. Throughout the procedure,                        the patient's blood pressure, pulse, and oxygen                        saturations were monitored continuously. The Endoscope                        was introduced through the mouth, and advanced to the  third part of duodenum. The upper GI endoscopy was                        accomplished with ease. The patient tolerated the                        procedure well. Findings:      The examined duodenum was normal.      The stomach was normal.      The cardia and gastric fundus  were normal on retroflexion.      One benign-appearing, intrinsic moderate (circumferential scarring or       stenosis; an endoscope may pass) stenosis was found at the       gastroesophageal junction. This stenosis measured 1.1 cm (inner       diameter). The stenosis was traversed. A TTS dilator was passed through       the scope. Dilation with a 12-13.5-15 mm balloon dilator was performed       to 13.5 mm. The dilation site was examined and showed moderate mucosal       disruption.      Diffuse moderate mucosal changes characterized by sloughing, tight       circumferential folds and a decreased vascular pattern were found in the       entire esophagus. Biopsies were taken with a cold forceps for histology. Impression:           - Normal examined duodenum.                       - Normal stomach.                       - Benign-appearing esophageal stenosis. Dilated.                       - Tight circumferentially folded, decreased vascular                        pattern mucosa in the esophagus. Biopsied. Recommendation:       - Discharge patient to home (with escort).                       - Resume previous diet.                       - Continue present medications.                       - Await pathology results.                       - Repeat upper endoscopy in 2 weeks for retreatment.                       - Return to GI office in 8 weeks. Procedure Code(s):    --- Professional ---                       [phone], Esophagogastroduodenoscopy, flexible, transoral;                        with transendoscopic balloon dilation of esophagus                        (  less than 30 mm diameter)                       43239, 59, Esophagogastroduodenoscopy, flexible,                        transoral; with biopsy, single or multiple Diagnosis Code(s):    --- Professional ---                       K22.2, Esophageal obstruction                       K22.8, Other specified diseases of esophagus                        R13.10, Dysphagia, unspecified CPT copyright 2019 American Medical Association. All rights reserved. The codes documented in this report are preliminary and upon coder review may  be revised to meet current compliance requirements. Wyline MoodKiran Shakevia Sarris, MD Wyline MoodKiran Staton Markey MD, MD 07/15/2019 8:24:33 AM This report has been signed electronically. Number of Addenda: 0 Note Initiated On: 07/15/2019 7:35 AM Total Procedure Duration: 0 hours 7 minutes 7 seconds  Estimated Blood Loss: Estimated blood loss: none.      [facility]

## 2019-07-15 NOTE — Transfer of Care (Signed)
Immediate Anesthesia Transfer of Care Note  Patient: [name]  Procedure(s) Performed: ESOPHAGOGASTRODUODENOSCOPY (EGD) WITH PROPOFOL with Dilation (N/A )  Patient Location: PACU and Endoscopy Unit  Anesthesia Type:General  Level of Consciousness: drowsy  Airway & Oxygen Therapy: Patient Spontanous Breathing and Patient connected to nasal cannula oxygen  Post-op Assessment: Report given to RN and Post -op Vital signs reviewed and stable  Post vital signs: Reviewed and stable  Last Vitals:  Vitals Value Taken Time  BP 162/102 07/15/19 0826  Temp    Pulse 85 07/15/19 0826  Resp 22 07/15/19 0826  SpO2 92 % 07/15/19 0826  Vitals shown include unvalidated device data.  Last Pain:  Vitals:   07/15/19 0820  TempSrc: (P) Tympanic  PainSc:          Complications: No apparent anesthesia complications

## 2019-07-16 ENCOUNTER — Encounter: Payer: Self-pay | Admitting: Gastroenterology

## 2019-07-16 LAB — SURGICAL PATHOLOGY

## 2019-07-21 ENCOUNTER — Other Ambulatory Visit: Payer: Self-pay

## 2019-07-21 ENCOUNTER — Telehealth: Payer: Self-pay

## 2019-07-21 DIAGNOSIS — K222 Esophageal obstruction: Secondary | ICD-10-CM

## 2019-07-21 DIAGNOSIS — R1310 Dysphagia, unspecified: Secondary | ICD-10-CM

## 2019-07-21 NOTE — Telephone Encounter (Signed)
-----   Message from Jonathon Bellows, MD sent at 07/15/2019  9:29 AM EDT ----- Regarding: please arrange appointment  Nicole Butler  Please arrange repeat EGD with dilation in 2-3 weeks- inform endo to keep Kenalog 10mg /ml strength for possible injection   Regards    Dr Jonathon Bellows  Gastroenterology/Hepatology Pager: [phone]

## 2019-07-21 NOTE — Telephone Encounter (Signed)
Spoke with pt and was able to schedule repeat endoscopy procedure.

## 2019-07-25 ENCOUNTER — Encounter: Payer: Self-pay | Admitting: Gastroenterology

## 2019-08-02 ENCOUNTER — Other Ambulatory Visit
Admission: RE | Admit: 2019-08-02 | Discharge: 2019-08-02 | Disposition: A | Discharge status: Home / Self Care | Payer: Medicare Other | Source: Ambulatory Visit | Attending: Gastroenterology | Admitting: Gastroenterology

## 2019-08-02 ENCOUNTER — Other Ambulatory Visit: Payer: Self-pay

## 2019-08-02 DIAGNOSIS — Z01812 Encounter for preprocedural laboratory examination: Secondary | ICD-10-CM | POA: Insufficient documentation

## 2019-08-02 DIAGNOSIS — Z20828 Contact with and (suspected) exposure to other viral communicable diseases: Secondary | ICD-10-CM | POA: Insufficient documentation

## 2019-08-02 LAB — SARS CORONAVIRUS 2 (TAT 6-24 HRS): SARS Coronavirus 2: NEGATIVE

## 2019-08-05 ENCOUNTER — Other Ambulatory Visit: Payer: Self-pay

## 2019-08-05 ENCOUNTER — Ambulatory Visit
Admission: RE | Admit: 2019-08-05 | Discharge: 2019-08-05 | Disposition: A | Discharge status: Home / Self Care | Payer: Medicare Other | Attending: Gastroenterology | Admitting: Gastroenterology

## 2019-08-05 ENCOUNTER — Ambulatory Visit: Payer: Medicare Other | Admitting: Registered Nurse

## 2019-08-05 ENCOUNTER — Encounter
Admission: RE | Disposition: A | Discharge status: Home / Self Care | Payer: Self-pay | Source: Home / Self Care | Attending: Gastroenterology

## 2019-08-05 DIAGNOSIS — Z801 Family history of malignant neoplasm of trachea, bronchus and lung: Secondary | ICD-10-CM | POA: Insufficient documentation

## 2019-08-05 DIAGNOSIS — Z820 Family history of epilepsy and other diseases of the nervous system: Secondary | ICD-10-CM | POA: Insufficient documentation

## 2019-08-05 DIAGNOSIS — R1310 Dysphagia, unspecified: Secondary | ICD-10-CM | POA: Insufficient documentation

## 2019-08-05 DIAGNOSIS — F329 Major depressive disorder, single episode, unspecified: Secondary | ICD-10-CM | POA: Insufficient documentation

## 2019-08-05 DIAGNOSIS — K222 Esophageal obstruction: Secondary | ICD-10-CM

## 2019-08-05 DIAGNOSIS — M8580 Other specified disorders of bone density and structure, unspecified site: Secondary | ICD-10-CM | POA: Insufficient documentation

## 2019-08-05 DIAGNOSIS — E079 Disorder of thyroid, unspecified: Secondary | ICD-10-CM | POA: Insufficient documentation

## 2019-08-05 DIAGNOSIS — Z888 Allergy status to other drugs, medicaments and biological substances status: Secondary | ICD-10-CM | POA: Insufficient documentation

## 2019-08-05 DIAGNOSIS — Z96641 Presence of right artificial hip joint: Secondary | ICD-10-CM | POA: Diagnosis not present

## 2019-08-05 DIAGNOSIS — G4700 Insomnia, unspecified: Secondary | ICD-10-CM | POA: Insufficient documentation

## 2019-08-05 DIAGNOSIS — D649 Anemia, unspecified: Secondary | ICD-10-CM | POA: Diagnosis not present

## 2019-08-05 DIAGNOSIS — F419 Anxiety disorder, unspecified: Secondary | ICD-10-CM | POA: Diagnosis not present

## 2019-08-05 DIAGNOSIS — Z87891 Personal history of nicotine dependence: Secondary | ICD-10-CM | POA: Diagnosis not present

## 2019-08-05 DIAGNOSIS — K219 Gastro-esophageal reflux disease without esophagitis: Secondary | ICD-10-CM | POA: Insufficient documentation

## 2019-08-05 DIAGNOSIS — M810 Age-related osteoporosis without current pathological fracture: Secondary | ICD-10-CM | POA: Diagnosis not present

## 2019-08-05 DIAGNOSIS — I10 Essential (primary) hypertension: Secondary | ICD-10-CM | POA: Diagnosis not present

## 2019-08-05 HISTORY — PX: ESOPHAGOGASTRODUODENOSCOPY (EGD) WITH PROPOFOL: SHX5813

## 2019-08-05 SURGERY — ESOPHAGOGASTRODUODENOSCOPY (EGD) WITH PROPOFOL
Anesthesia: General

## 2019-08-05 MED ORDER — LIDOCAINE HCL (PF) 1 % IJ SOLN
INTRAMUSCULAR | Status: AC
  Filled 2019-08-05: qty 2

## 2019-08-05 MED ORDER — LIDOCAINE HCL (PF) 2 % IJ SOLN
INTRAMUSCULAR | Status: DC | PRN
  Administered 2019-08-05: 60 mg via INTRADERMAL

## 2019-08-05 MED ORDER — SODIUM CHLORIDE 0.9 % IV SOLN
INTRAVENOUS | Status: DC
  Administered 2019-08-05: 09:00:00 via INTRAVENOUS

## 2019-08-05 MED ORDER — TRIAMCINOLONE ACETONIDE 40 MG/ML IJ SUSP
80.0000 mg | Freq: Once | INTRAMUSCULAR | Status: DC
  Filled 2019-08-05: qty 2

## 2019-08-05 MED ORDER — PROPOFOL 10 MG/ML IV BOLUS
INTRAVENOUS | Status: DC | PRN
  Administered 2019-08-05: 50 mg via INTRAVENOUS

## 2019-08-05 MED ORDER — PROPOFOL 500 MG/50ML IV EMUL
INTRAVENOUS | Status: DC | PRN
  Administered 2019-08-05: 100 ug/kg/min via INTRAVENOUS

## 2019-08-05 MED ORDER — LABETALOL HCL 5 MG/ML IV SOLN
INTRAVENOUS | Status: DC | PRN
  Administered 2019-08-05: 5 mg via INTRAVENOUS

## 2019-08-05 MED ORDER — TRIAMCINOLONE ACETONIDE 40 MG/ML IJ SUSP
40.0000 mg | Freq: Once | INTRAMUSCULAR | Status: AC
  Administered 2019-08-05: 10:00:00 40 mg via INTRAMUSCULAR
  Filled 2019-08-05: qty 1

## 2019-08-05 MED ORDER — FENTANYL CITRATE (PF) 100 MCG/2ML IJ SOLN
INTRAMUSCULAR | Status: AC
  Filled 2019-08-05: qty 2

## 2019-08-05 MED ORDER — PROPOFOL 10 MG/ML IV BOLUS
INTRAVENOUS | Status: AC
  Filled 2019-08-05: qty 20

## 2019-08-05 MED ORDER — FENTANYL CITRATE (PF) 100 MCG/2ML IJ SOLN
INTRAMUSCULAR | Status: DC | PRN
  Administered 2019-08-05: 25 ug via INTRAVENOUS

## 2019-08-05 NOTE — Op Note (Addendum)
[facility] Gastroenterology Patient Name: Nicole Butler Procedure Date: 08/05/2019 9:10 AM MRN: [id number] Account #: [id number] Date of Birth: 1946-05-27 Admit Type: Outpatient Age: 73 Room: [facility] ENDO ROOM 3 Gender: Female Note Status: Finalized Procedure:            Upper GI endoscopy Indications:          Dysphagia Providers:            Jonathon Bellows MD, MD Referring MD:         Kathrine Haddock (Referring MD) Medicines:            Monitored Anesthesia Care Complications:        No immediate complications. Procedure:            Pre-Anesthesia Assessment:                       - Prior to the procedure, a History and Physical was                        performed, and patient medications, allergies and                        sensitivities were reviewed. The patient's tolerance of                        previous anesthesia was reviewed.                       - The risks and benefits of the procedure and the                        sedation options and risks were discussed with the                        patient. All questions were answered and informed                        consent was obtained.                       - ASA Grade Assessment: II - A patient with mild                        systemic disease.                       After obtaining informed consent, the endoscope was                        passed under direct vision. Throughout the procedure,                        the patient's blood pressure, pulse, and oxygen                        saturations were monitored continuously. The Endoscope                        was introduced through the mouth, and advanced to the  third part of duodenum. The upper GI endoscopy was                        accomplished with ease. The patient tolerated the                        procedure well. Findings:      The examined duodenum was normal.      The stomach was normal.      The cardia and gastric fundus  were normal on retroflexion.      One benign-appearing, intrinsic moderate (circumferential scarring or       stenosis; an endoscope may pass) stenosis was found at the       gastroesophageal junction. This stenosis measured 1.3 cm (inner       diameter) x less than one cm (in length). The stenosis was traversed. A       TTS dilator was passed through the scope. Dilation with a 12-13.5-15 mm       balloon dilator was performed to 15 mm. The dilation site was examined       following endoscope reinsertion and showed complete resolution of       luminal narrowing. Inflated baloon to 15 mm and pulled through esophagus       till 20 cm mark      One benign-appearing, intrinsic moderate stenosis was found at the       gastroesophageal junction. The stenosis was traversed. Area was       successfully injected with 4 mL of triamcinolone (10 mg/mL) for drug       delivery. Impression:           - Normal examined duodenum.                       - Normal stomach.                       - Benign-appearing esophageal stenosis. Dilated.                        Injected.                       - No specimens collected. Recommendation:       - Discharge patient to home (with escort).                       - Resume previous diet.                       - Continue present medications.                       - Return to my office in 2 weeks. Procedure Code(s):    --- Professional ---                       [phone], Esophagogastroduodenoscopy, flexible, transoral;                        with transendoscopic balloon dilation of esophagus                        (less than 30 mm diameter)  43236, 59, Esophagogastroduodenoscopy, flexible,                        transoral; with directed submucosal injection(s), any                        substance Diagnosis Code(s):    --- Professional ---                       K22.2, Esophageal obstruction                       R13.10, Dysphagia,  unspecified CPT copyright 2019 American Medical Association. All rights reserved. The codes documented in this report are preliminary and upon coder review may  be revised to meet current compliance requirements. Wyline MoodKiran Neta Upadhyay, MD Wyline MoodKiran Vendela Troung MD, MD 08/05/2019 9:35:56 AM This report has been signed electronically. Number of Addenda: 0 Note Initiated On: 08/05/2019 9:10 AM Estimated Blood Loss: Estimated blood loss: none.      [facility]

## 2019-08-05 NOTE — H&P (Signed)
[name], MD [address], [place], [place], [address] [address], [place], [place], [address] Phone: [phone]  Fax: [phone]  Primary Care Physician:  Marjie Skiff, NP   Pre-Procedure History & Physical: HPI:  LINAH KLAPPER is a 73 y.o. female is here for an endoscopy    Past Medical History:  Diagnosis Date  . Anemia    H/O   . Arthritis   . Broken femur (HCC)    left  . Complication of anesthesia    PT STATES SHE FORGETS TO BREATHE WHEN COMING OUT OF ANESTHESIA  . GERD (gastroesophageal reflux disease)   . Hip pain    left  . Insomnia   . Osteopenia   . Osteoporosis   . Thyroid disease     Past Surgical History:  Procedure Laterality Date  . ANKLE SURGERY Left   . ESOPHAGOGASTRODUODENOSCOPY (EGD) WITH PROPOFOL N/A 11/12/2018   Procedure: ESOPHAGOGASTRODUODENOSCOPY (EGD) WITH PROPOFOL;  Surgeon: [name], MD;  Location: [facility] ENDOSCOPY;  Service: Gastroenterology;  Laterality: N/A;  . ESOPHAGOGASTRODUODENOSCOPY (EGD) WITH PROPOFOL N/A 12/17/2018   Procedure: ESOPHAGOGASTRODUODENOSCOPY (EGD) WITH PROPOFOL with Dilation;  Surgeon: [name], MD;  Location: [facility] ENDOSCOPY;  Service: Gastroenterology;  Laterality: N/A;  . ESOPHAGOGASTRODUODENOSCOPY (EGD) WITH PROPOFOL N/A 07/15/2019   Procedure: ESOPHAGOGASTRODUODENOSCOPY (EGD) WITH PROPOFOL with Dilation;  Surgeon: [name], MD;  Location: [facility] ENDOSCOPY;  Service: Gastroenterology;  Laterality: N/A;  Per Dr. Tobi Bastos, include steroid injection post-dilation  . FOOT SURGERY Left    x3  . HARDWARE REMOVAL Left 12/21/2015   Procedure: HARDWARE REMOVAL;  Surgeon: Juanell Fairly, MD;  Location: [facility] ORS;  Service: Orthopedics;  Laterality: Left;  Left HIP   . HIP SURGERY Left    x2  . KNEE SURGERY Right    x2  . LEG SURGERY Left    x2  . TOTAL HIP ARTHROPLASTY Right    x4    Prior to Admission medications   Medication Sig Start Date End Date Taking? Authorizing Provider   diclofenac sodium (VOLTAREN) 1 % GEL Apply topically 4 (four) times daily.   Yes [provider]  Lidocaine HCl 4 % SOLN Apply topically.   Yes [provider]  linaclotide Karlene Einstein) 145 MCG CAPS capsule Take 1 capsule (145 mcg total) by mouth daily before breakfast. 12/14/18  Yes [name], MD  mupirocin ointment (BACTROBAN) 2 % Place 1 application into the nose 2 (two) times daily. 08/27/17  Yes Gabriel Cirri, NP  naproxen sodium (ALEVE) 220 MG tablet Take 220 mg by mouth. Takes twice daily   Yes [provider]  omeprazole (PRILOSEC) 40 MG capsule Take 1 capsule (40 mg total) by mouth 2 (two) times daily. 11/17/18  Yes [name], MD  ondansetron (ZOFRAN) 4 MG tablet Take 1 tablet (4 mg total) by mouth daily as needed for nausea or vomiting. 03/26/17  Yes Laural Benes, Megan P, DO  oxyCODONE-acetaminophen (PERCOCET/ROXICET) 5-325 MG tablet oxycodone-acetaminophen 5 mg-325 mg tablet 06/12/18  Yes [provider]  polyethylene glycol (MIRALAX / GLYCOLAX) packet Take by mouth.   Yes [provider]  tiZANidine (ZANAFLEX) 2 MG tablet tizanidine 2 mg tablet 06/12/18  Yes [provider]  traZODone (DESYREL) 50 MG tablet Take 2 tablets (100 mg total) at bedtime by mouth. 09/23/17  Yes Gabriel Cirri, NP  oxyCODONE-acetaminophen (PERCOCET/ROXICET) 5-325 MG tablet Take by mouth every 8 (eight) hours as needed.  01/09/17   [provider]  Allergies as of 07/21/2019 - Review Complete 07/15/2019  Allergen Reaction Noted  . Butrans [buprenorphine] Hives 06/11/2014  . Fosamax [alendronate sodium] Other (See Comments) 08/29/2015    Family History  Problem Relation Age of Onset  . Alzheimer's disease Mother   . Cancer Father        lung    Social History   Socioeconomic History  . Marital status: Significant Other    Spouse name: Not on file  . Number of children: Not on file  . Years of education: Not on file  . Highest education level:  Associate degree: academic program  Occupational History  . Not on file  Social Needs  . Financial resource strain: Not hard at all  . Food insecurity    Worry: Never true    Inability: Never true  . Transportation needs    Medical: No    Non-medical: No  Tobacco Use  . Smoking status: Former Smoker    Packs/day: 1.00    Years: 10.00    Pack years: 10.00    Types: Cigarettes    Quit date: 12/15/1995    Years since quitting: 23.6  . Smokeless tobacco: Never Used  Substance and Sexual Activity  . Alcohol use: Yes    Alcohol/week: 8.0 standard drinks    Types: 8 Glasses of wine per week    Comment: 2 GLASSES EVERY EVENING  . Drug use: No  . Sexual activity: Not on file  Lifestyle  . Physical activity    Days per week: 0 days    Minutes per session: 0 min  . Stress: Not at all  Relationships  . Social connections    Talks on phone: More than three times a week    Gets together: More than three times a week    Attends religious service: Never    Active member of club or organization: No    Attends meetings of clubs or organizations: Never    Relationship status: Living with partner  . Intimate partner violence    Fear of current or ex partner: No    Emotionally abused: No    Physically abused: No    Forced sexual activity: No  Other Topics Concern  . Not on file  Social History Narrative   Sees partner 3 times a week, rotates between houses    Review of Systems: See HPI, otherwise negative ROS  Physical Exam: BP (!) 167/93   Pulse 60   Temp 98 F (36.7 C) (Oral)   Resp 15   Ht 5\' 7"  (1.702 m)   Wt 42.2 kg   SpO2 97%   BMI 14.57 kg/m  General:   Alert,  pleasant and cooperative in NAD Head:  Normocephalic and atraumatic. Neck:  Supple; no masses or thyromegaly. Lungs:  Clear throughout to auscultation, normal respiratory effort.    Heart:  +S1, +S2, Regular rate and rhythm, No edema. Abdomen:  Soft, nontender and nondistended. Normal bowel sounds,  without guarding, and without rebound.   Neurologic:  Alert and  oriented x4;  grossly normal neurologically.  Impression/Plan: SAREE KROGH is here for an endoscopy  to be performed for  evaluation of dysphagia     Risks, benefits, limitations, and alternatives regarding endoscopy, dilation  have been reviewed with the patient.  Questions have been answered.  All parties agreeable.   Jonathon Bellows, MD  08/05/2019, 9:07 AM

## 2019-08-05 NOTE — Transfer of Care (Signed)
Immediate Anesthesia Transfer of Care Note  Patient: Nicole Butler  Procedure(s) Performed: ESOPHAGOGASTRODUODENOSCOPY (EGD) WITH PROPOFOL with Dilation (N/A )  Patient Location: PACU  Anesthesia Type:General  Level of Consciousness: awake, alert  and oriented  Airway & Oxygen Therapy: Patient Spontanous Breathing  Post-op Assessment: Report given to RN and Post -op Vital signs reviewed and stable  Post vital signs: Reviewed and stable  Last Vitals:  Vitals Value Taken Time  BP 157/89 08/05/19 0941  Temp 36.1 C 08/05/19 0938  Pulse 73 08/05/19 0941  Resp 19 08/05/19 0941  SpO2 100 % 08/05/19 0941    Last Pain:  Vitals:   08/05/19 0938  TempSrc: Tympanic  PainSc: Asleep         Complications: No apparent anesthesia complications

## 2019-08-05 NOTE — Anesthesia Preprocedure Evaluation (Signed)
Anesthesia Evaluation  Patient identified by MRN, date of birth, ID band Patient awake    Reviewed: Allergy & Precautions, H&P , NPO status , Patient's Chart, lab work & pertinent test results  Airway Mallampati: III  TM Distance: >3 FB     Dental  (+) Upper Dentures, Lower Dentures   Pulmonary neg pulmonary ROS, neg COPD, former smoker,           Cardiovascular hypertension, (-) angina(-) Past MI and (-) Cardiac Stents (-) dysrhythmias      Neuro/Psych PSYCHIATRIC DISORDERS Anxiety Depression negative neurological ROS     GI/Hepatic Neg liver ROS, GERD  Controlled,  Endo/Other  negative endocrine ROS  Renal/GU negative Renal ROS  negative genitourinary   Musculoskeletal   Abdominal   Peds  Hematology  (+) Blood dyscrasia, anemia ,   Anesthesia Other Findings Past Medical History: No date: Anemia     Comment:  H/O  No date: Arthritis No date: Broken femur (HCC)     Comment:  left No date: Complication of anesthesia     Comment:  PT STATES SHE FORGETS TO BREATHE WHEN COMING OUT OF               ANESTHESIA No date: GERD (gastroesophageal reflux disease) No date: Hip pain     Comment:  left No date: Insomnia No date: Osteopenia No date: Osteoporosis No date: Thyroid disease  Past Surgical History: No date: ANKLE SURGERY; Left 11/12/2018: ESOPHAGOGASTRODUODENOSCOPY (EGD) WITH PROPOFOL; N/A     Comment:  Procedure: ESOPHAGOGASTRODUODENOSCOPY (EGD) WITH               PROPOFOL;  Surgeon: Jonathon Bellows, MD;  Location: [facility]               ENDOSCOPY;  Service: Gastroenterology;  Laterality: N/A; 12/17/2018: ESOPHAGOGASTRODUODENOSCOPY (EGD) WITH PROPOFOL; N/A     Comment:  Procedure: ESOPHAGOGASTRODUODENOSCOPY (EGD) WITH               PROPOFOL with Dilation;  Surgeon: Jonathon Bellows, MD;                Location: [facility] ENDOSCOPY;  Service: Gastroenterology;                Laterality: N/A; 07/15/2019:  ESOPHAGOGASTRODUODENOSCOPY (EGD) WITH PROPOFOL; N/A     Comment:  Procedure: ESOPHAGOGASTRODUODENOSCOPY (EGD) WITH               PROPOFOL with Dilation;  Surgeon: Jonathon Bellows, MD;                Location: [facility] ENDOSCOPY;  Service: Gastroenterology;                Laterality: N/A;  Per Dr. Vicente Males, include steroid injection              post-dilation No date: FOOT SURGERY; Left     Comment:  x3 12/21/2015: HARDWARE REMOVAL; Left     Comment:  Procedure: HARDWARE REMOVAL;  Surgeon: Thornton Park,               MD;  Location: [facility] ORS;  Service: Orthopedics;                Laterality: Left;  Left HIP  No date: HIP SURGERY; Left     Comment:  x2 No date: KNEE SURGERY; Right     Comment:  x2 No date: LEG SURGERY; Left     Comment:  x2 No date: TOTAL HIP ARTHROPLASTY; Right  Comment:  x4  BMI    Body Mass Index: 14.57 kg/m      Reproductive/Obstetrics negative OB ROS                             Anesthesia Physical Anesthesia Plan  ASA: II  Anesthesia Plan: General   Post-op Pain Management:    Induction:   PONV Risk Score and Plan: Propofol infusion and TIVA  Airway Management Planned: Natural Airway and Nasal Cannula  Additional Equipment:   Intra-op Plan:   Post-operative Plan:   Informed Consent: I have reviewed the patients History and Physical, chart, labs and discussed the procedure including the risks, benefits and alternatives for the proposed anesthesia with the patient or authorized representative who has indicated his/her understanding and acceptance.     Dental Advisory Given  Plan Discussed with: Anesthesiologist and CRNA  Anesthesia Plan Comments:         Anesthesia Quick Evaluation

## 2019-08-05 NOTE — Anesthesia Postprocedure Evaluation (Signed)
Anesthesia Post Note  Patient: Nicole Butler  Procedure(s) Performed: ESOPHAGOGASTRODUODENOSCOPY (EGD) WITH PROPOFOL with Dilation (N/A )  Patient location during evaluation: PACU Anesthesia Type: General Level of consciousness: awake and alert Pain management: pain level controlled Vital Signs Assessment: post-procedure vital signs reviewed and stable Respiratory status: spontaneous breathing, nonlabored ventilation and respiratory function stable Cardiovascular status: blood pressure returned to baseline and stable Postop Assessment: no apparent nausea or vomiting Anesthetic complications: no     Last Vitals:  Vitals:   08/05/19 0948 08/05/19 1018  BP: (!) 132/95 110/64  Pulse:  67  Resp:  16  Temp:    SpO2:  97%    Last Pain:  Vitals:   08/05/19 1018  TempSrc:   PainSc: 0-No pain                 [name]

## 2019-08-05 NOTE — Anesthesia Post-op Follow-up Note (Signed)
Anesthesia QCDR form completed.        

## 2019-08-06 ENCOUNTER — Encounter: Payer: Self-pay | Admitting: Gastroenterology

## 2019-08-19 ENCOUNTER — Ambulatory Visit (INDEPENDENT_AMBULATORY_CARE_PROVIDER_SITE_OTHER): Payer: Medicare Other | Admitting: Gastroenterology

## 2019-08-19 DIAGNOSIS — R1310 Dysphagia, unspecified: Secondary | ICD-10-CM

## 2019-08-19 NOTE — Progress Notes (Signed)
Nicole Butler , MD [address]  [address]  [place], [place] [address]  Main: [phone]  Fax: [phone]   Primary Care Physician: Marjie Skiff, NP  Virtual Visit via Telephone Note  I connected with patient on 08/19/19 at  1:00 PM EDT by telephone and verified that I am speaking with the correct person using two identifiers.   I discussed the limitations, risks, security and privacy concerns of performing an evaluation and management service by telephone and the availability of in person appointments. I also discussed with the patient that there may be a patient responsible charge related to this service. The patient expressed understanding and agreed to proceed.  Location of Patient: Home Location of Provider: Home Persons involved: Patient and provider only   History of Present Illness:  F/u for dysphagia  HPI: Nicole Butler is a 73 y.o. female    She was initially referred and seen on 10/29/18 for dysphagia ongoing for a year. .She has a bowel movement once a day - hard as she is on opiods. Symptoms are worse when she has not had a good bowel movement  11/10/2018 : Normal TSH,Hb 13 grams   11/12/2018 : EGD:One benign-appearing, intrinsic moderate (stenosis was found at the gastroesophageal junction. This stenosis measured less than one cm.The stenosis was traversed. A TTS dilator was passed through the scope. Dilation with a 08-15-11 mm balloon dilator was performed to 11 mm. 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  Biopsies of the esophagus showed no eosinophilia   Interval history8/20/2020-08/19/2019  Her last visit she felt that the dysphagia returned.EGD 08/05/2019: GE junction stricture dilated from 86mm to 15 mm and injection of steroid into the stricture.  She states that after her dilation she feels significantly better but sometimes feels that her esophagus cramps up.  She is  taking her PPI.   Current Outpatient Medications  Medication Sig Dispense Refill  . diclofenac sodium (VOLTAREN) 1 % GEL Apply topically 4 (four) times daily.    . Lidocaine HCl 4 % SOLN Apply topically.    Marland Kitchen linaclotide (LINZESS) 145 MCG CAPS capsule Take 1 capsule (145 mcg total) by mouth daily before breakfast. 30 capsule 5  . mupirocin ointment (BACTROBAN) 2 % Place 1 application into the nose 2 (two) times daily. 22 g 0  . naproxen sodium (ALEVE) 220 MG tablet Take 220 mg by mouth. Takes twice daily    . omeprazole (PRILOSEC) 40 MG capsule Take 1 capsule (40 mg total) by mouth 2 (two) times daily. 180 capsule 1  . ondansetron (ZOFRAN) 4 MG tablet Take 1 tablet (4 mg total) by mouth daily as needed for nausea or vomiting. 20 tablet 6  . oxyCODONE-acetaminophen (PERCOCET/ROXICET) 5-325 MG tablet Take by mouth every 8 (eight) hours as needed.     Marland Kitchen oxyCODONE-acetaminophen (PERCOCET/ROXICET) 5-325 MG tablet oxycodone-acetaminophen 5 mg-325 mg tablet    . polyethylene glycol (MIRALAX / GLYCOLAX) packet Take by mouth.    Marland Kitchen tiZANidine (ZANAFLEX) 2 MG tablet tizanidine 2 mg tablet    . traZODone (DESYREL) 50 MG tablet Take 2 tablets (100 mg total) at bedtime by mouth. 180 tablet 0   No current facility-administered medications for this visit.     Allergies as of 08/19/2019 - Review Complete 08/05/2019  Allergen Reaction Noted  . Butrans [buprenorphine] Hives 06/11/2014  . Fosamax [alendronate sodium] Other (See Comments) 08/29/2015    Review of Systems:  All systems reviewed and negative except where noted in HPI.   Observations/Objective:  Labs: CMP     Component Value Date/Time   NA 128 (L) 04/24/2017 1117   NA 131 (L) 09/05/2014 0419   K 4.7 04/24/2017 1117   K 4.6 09/05/2014 0419   CL 89 (L) 04/24/2017 1117   CL 99 09/05/2014 0419   CO2 23 04/24/2017 1117   CO2 25 09/05/2014 0419   GLUCOSE 79 04/24/2017 1117   GLUCOSE 98 02/12/2016 0325   GLUCOSE 120 (H) 09/05/2014  0419   BUN 7 (L) 04/24/2017 1117   BUN 6 (L) 09/05/2014 0419   CREATININE 0.46 (L) 04/24/2017 1117   CREATININE 0.47 (L) 09/05/2014 0419   CALCIUM 9.4 04/24/2017 1117   CALCIUM 7.9 (L) 09/05/2014 0419   PROT 6.7 04/16/2017 1029   PROT 6.7 09/03/2014 2237   ALBUMIN 4.3 04/16/2017 1029   ALBUMIN 3.7 09/03/2014 2237   AST 23 04/16/2017 1029   AST 29 09/03/2014 2237   ALT 12 04/16/2017 1029   ALT 19 09/03/2014 2237   ALKPHOS 48 04/16/2017 1029   ALKPHOS 77 09/03/2014 2237   BILITOT 0.4 04/16/2017 1029   BILITOT 0.2 09/03/2014 2237   GFRNONAA 102 04/24/2017 1117   GFRNONAA >60 09/05/2014 0419   GFRAA 117 04/24/2017 1117   GFRAA >60 09/05/2014 0419   Lab Results  Component Value Date   WBC 7.3 11/10/2018   HGB 13.1 11/10/2018   HCT 37.4 11/10/2018   MCV 94 11/10/2018   PLT 342 11/10/2018    Imaging Studies: No results found.  Assessment and Plan:   Nicole Butler is a 73 y.o. y/o female here to follow-up for dysphagia.  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  Prior to which I had stretched it to 12 mm.    Repeat dilation to 15 mm on 08/05/2019 with injection of steroid.  My concern with her is that she has closed up pretty quickly after her first dilation.  I strongly suggested to her that we should probably stretch her once more and go to 16.5 mm which should give her more lasting benefit from dysphagia.  I also suggested that since I have injected the steroid I wanted to go back and see how well it is held up.  My concern about not doing this pretty soon is that she may close back up and we may have to start the process of dilation from a small number all over again.  She understands my reasoning behind this but she says that she is not ready to go through another procedure right now.  She says that she has had too many procedures recently.  She said that she would like to give it a week or 2 before we repeat her endoscopy and stretcher further up.  She mentioned  that she will give me a call back to reschedule her next dilation.  In the meanwhile I suggest that she continue taking her PPI.     I discussed the assessment and treatment plan with the patient. The patient was provided an opportunity to ask questions and all were answered. The patient agreed with the plan and demonstrated an understanding of the instructions.   The patient was advised to call back or seek an in-person evaluation if the symptoms worsen or if the condition fails to improve as anticipated.  I provided 12 minutes of non-face-to-face time during this encounter.  Dr Jonathon Bellows MD,MRCP [facility])  Gastroenterology/Hepatology Pager: [phone]   Speech recognition software was used to dictate this note.

## 2019-08-20 ENCOUNTER — Other Ambulatory Visit: Payer: Self-pay

## 2019-08-20 ENCOUNTER — Telehealth: Payer: Self-pay | Admitting: Gastroenterology

## 2019-08-20 DIAGNOSIS — K222 Esophageal obstruction: Secondary | ICD-10-CM

## 2019-08-20 DIAGNOSIS — R1310 Dysphagia, unspecified: Secondary | ICD-10-CM

## 2019-08-20 NOTE — Telephone Encounter (Signed)
Pt left another vm she would like a call back today

## 2019-08-20 NOTE — Telephone Encounter (Signed)
Spoke with pt and was able to schedule her repeat endoscopy procedure for 08-27-19.

## 2019-08-20 NOTE — Telephone Encounter (Signed)
Pt left vm she spoke with Dr. Vicente Males yesterday and she would like to set up an endoscopy for next week please call pt

## 2019-08-24 ENCOUNTER — Other Ambulatory Visit: Payer: Self-pay

## 2019-08-24 ENCOUNTER — Other Ambulatory Visit
Admission: RE | Admit: 2019-08-24 | Discharge: 2019-08-24 | Disposition: A | Discharge status: Home / Self Care | Payer: Medicare Other | Source: Ambulatory Visit | Attending: Gastroenterology | Admitting: Gastroenterology

## 2019-08-24 DIAGNOSIS — Z01812 Encounter for preprocedural laboratory examination: Secondary | ICD-10-CM | POA: Diagnosis present

## 2019-08-24 DIAGNOSIS — Z20828 Contact with and (suspected) exposure to other viral communicable diseases: Secondary | ICD-10-CM | POA: Insufficient documentation

## 2019-08-24 LAB — SARS CORONAVIRUS 2 (TAT 6-24 HRS): SARS Coronavirus 2: NEGATIVE

## 2019-08-27 ENCOUNTER — Ambulatory Visit: Payer: Medicare Other | Admitting: Registered Nurse

## 2019-08-27 ENCOUNTER — Ambulatory Visit
Admission: RE | Admit: 2019-08-27 | Discharge: 2019-08-27 | Disposition: A | Discharge status: Home / Self Care | Payer: Medicare Other | Attending: Gastroenterology | Admitting: Gastroenterology

## 2019-08-27 ENCOUNTER — Encounter: Payer: Self-pay | Admitting: *Deleted

## 2019-08-27 ENCOUNTER — Ambulatory Visit: Payer: Medicare Other

## 2019-08-27 ENCOUNTER — Encounter
Admission: RE | Disposition: A | Discharge status: Home / Self Care | Payer: Self-pay | Source: Home / Self Care | Attending: Gastroenterology

## 2019-08-27 DIAGNOSIS — K219 Gastro-esophageal reflux disease without esophagitis: Secondary | ICD-10-CM | POA: Insufficient documentation

## 2019-08-27 DIAGNOSIS — Z888 Allergy status to other drugs, medicaments and biological substances status: Secondary | ICD-10-CM | POA: Insufficient documentation

## 2019-08-27 DIAGNOSIS — S1121XA Laceration without foreign body of pharynx and cervical esophagus, initial encounter: Secondary | ICD-10-CM

## 2019-08-27 DIAGNOSIS — Z791 Long term (current) use of non-steroidal anti-inflammatories (NSAID): Secondary | ICD-10-CM | POA: Diagnosis not present

## 2019-08-27 DIAGNOSIS — Z96641 Presence of right artificial hip joint: Secondary | ICD-10-CM | POA: Insufficient documentation

## 2019-08-27 DIAGNOSIS — R1310 Dysphagia, unspecified: Secondary | ICD-10-CM | POA: Insufficient documentation

## 2019-08-27 DIAGNOSIS — Z79899 Other long term (current) drug therapy: Secondary | ICD-10-CM | POA: Diagnosis not present

## 2019-08-27 DIAGNOSIS — Z87891 Personal history of nicotine dependence: Secondary | ICD-10-CM | POA: Insufficient documentation

## 2019-08-27 DIAGNOSIS — F418 Other specified anxiety disorders: Secondary | ICD-10-CM | POA: Diagnosis not present

## 2019-08-27 DIAGNOSIS — I10 Essential (primary) hypertension: Secondary | ICD-10-CM | POA: Insufficient documentation

## 2019-08-27 DIAGNOSIS — Z79891 Long term (current) use of opiate analgesic: Secondary | ICD-10-CM | POA: Insufficient documentation

## 2019-08-27 DIAGNOSIS — K222 Esophageal obstruction: Secondary | ICD-10-CM | POA: Diagnosis not present

## 2019-08-27 DIAGNOSIS — M1990 Unspecified osteoarthritis, unspecified site: Secondary | ICD-10-CM | POA: Insufficient documentation

## 2019-08-27 HISTORY — PX: ESOPHAGOGASTRODUODENOSCOPY (EGD) WITH PROPOFOL: SHX5813

## 2019-08-27 SURGERY — ESOPHAGOGASTRODUODENOSCOPY (EGD) WITH PROPOFOL
Anesthesia: General

## 2019-08-27 MED ORDER — LABETALOL HCL 5 MG/ML IV SOLN
INTRAVENOUS | Status: DC | PRN
  Administered 2019-08-27: 10 mg via INTRAVENOUS

## 2019-08-27 MED ORDER — EPINEPHRINE 1 MG/10ML IJ SOSY
PREFILLED_SYRINGE | INTRAMUSCULAR | Status: AC
  Filled 2019-08-27: qty 10

## 2019-08-27 MED ORDER — IOHEXOL 300 MG/ML  SOLN
50.0000 mL | Freq: Once | INTRAMUSCULAR | Status: AC | PRN
  Administered 2019-08-27: 12:00:00 50 mL via ORAL

## 2019-08-27 MED ORDER — PROPOFOL 500 MG/50ML IV EMUL
INTRAVENOUS | Status: DC | PRN
  Administered 2019-08-27: 140 ug/kg/min via INTRAVENOUS

## 2019-08-27 MED ORDER — SODIUM CHLORIDE 0.9 % IV SOLN
INTRAVENOUS | Status: DC
  Administered 2019-08-27: 09:00:00 via INTRAVENOUS

## 2019-08-27 MED ORDER — EPINEPHRINE 1 MG/10ML IJ SOSY
PREFILLED_SYRINGE | INTRAMUSCULAR | Status: DC | PRN
  Administered 2019-08-27: 0.3 mg via INTRAVENOUS

## 2019-08-27 MED ORDER — PROPOFOL 10 MG/ML IV BOLUS
INTRAVENOUS | Status: DC | PRN
  Administered 2019-08-27: 60 mg via INTRAVENOUS

## 2019-08-27 NOTE — Progress Notes (Signed)
Patient arrived to recovery room after procedure. Vitals have been stable. Patient felt nauseous and was given Zofran. She still proceeded with an emesis episode. Pt co of stomach ache w/ assessment of stomach feeling slightly distended but not firm and slightly tender. Dr Vicente Males ordered a CT and we are currently waiting on orders for the next step.

## 2019-08-27 NOTE — Progress Notes (Signed)
After the procedure the patient was comfortable not in any pain or distress no abdominal pain.  Wide-awake.  Discussed radiology and obtained a Gastrografin swallow.  No evidence of perforation seen on imaging.  Patient still wide-awake and not in any pain or distress over an hour after the procedure.  Able to tolerate liquids comfortably.  Informed patient that she will be going home to stay on clear liquids for 2 days then start a soft diet.  Continue Prilosec 40 mg twice daily.  Return to the ER ASAP if she develops chest pain, fever, abdominal pain, feels unwell.  She understood and verbalized.  Dr Jonathon Bellows MD,MRCP [facility]) Gastroenterology/Hepatology Pager: [phone]

## 2019-08-27 NOTE — Op Note (Signed)
[facility] Gastroenterology Patient Name: Nicole Butler Procedure Date: 08/27/2019 9:04 AM MRN: [id number] Account #: [id number] Date of Birth: 1946-06-21 Admit Type: Outpatient Age: 73 Room: [facility] ENDO ROOM 4 Gender: Female Note Status: Finalized Procedure:            Upper GI endoscopy Indications:          Dysphagia Providers:            [name] MD, MD Referring MD:         No Local Md, MD (Referring MD) Medicines:            Monitored Anesthesia Care Complications:        No immediate complications. Procedure:            Pre-Anesthesia Assessment:                       - Prior to the procedure, a History and Physical was                        performed, and patient medications, allergies and                        sensitivities were reviewed. The patient's tolerance of                        previous anesthesia was reviewed.                       - The risks and benefits of the procedure and the                        sedation options and risks were discussed with the                        patient. All questions were answered and informed                        consent was obtained.                       - ASA Grade Assessment: II - A patient with mild                        systemic disease.                       After obtaining informed consent, the endoscope was                        passed under direct vision. Throughout the procedure,                        the patient's blood pressure, pulse, and oxygen                        saturations were monitored continuously. The Endoscope                        was introduced through the mouth, and advanced to the  third part of duodenum. The upper GI endoscopy was                        accomplished with ease. The patient tolerated the                        procedure well. Findings:      The examined duodenum was normal.      The stomach was normal.      One benign-appearing,  intrinsic mild (non-circumferential scarring)       stenosis was found at the gastroesophageal junction. This stenosis       measured 1.5 cm (inner diameter). The stenosis was traversed. A TTS       dilator was passed through the scope. Dilation with a 15-16.5-18 mm       balloon dilator was performed to 18 mm. The dilation site was examined       and showed complete resolution of luminal narrowing.      Red blood was found at the gastroesophageal junction. site of dilation       Area was unsuccessfully injected with 4 mL of a 1:10,000 solution of       epinephrine for hemostasis. To prevent bleeding post-intervention, one       hemostatic clip was successfully placed. Clipd was deployed and       appearede in positionb but there was a small mucosal defect under the       clip which was not clear if a perforation or not. There was still       ongoing bleeding To stop active bleeding, hemostatic spray was deployed.       A single spray was applied. There was no bleeding at the end of the       procedure. Impression:           - Normal examined duodenum.                       - Normal stomach.                       - Benign-appearing esophageal stenosis. Dilated.                       - Red blood at the gastroesophageal junction. Treatment                        not successful. Clip was placed. hemostatic spray                        applied.                       - No specimens collected. Recommendation:       - - Return patient to recovery                       - CT scan of the chest /abdoimen to r/o perforation Procedure Code(s):    --- Professional ---                       [id number], Esophagogastroduodenoscopy, flexible, transoral;                        with control  of bleeding, any method                       43249, Esophagogastroduodenoscopy, flexible, transoral;                        with transendoscopic balloon dilation of esophagus                        (less than 30 mm  diameter) Diagnosis Code(s):    --- Professional ---                       K22.2, Esophageal obstruction                       K22.8, Other specified diseases of esophagus                       R13.10, Dysphagia, unspecified CPT copyright 2019 American Medical Association. All rights reserved. The codes documented in this report are preliminary and upon coder review may  be revised to meet current compliance requirements. Jonathon Bellows, MD Jonathon Bellows MD, MD 08/27/2019 9:36:21 AM This report has been signed electronically. Number of Addenda: 0 Note Initiated On: 08/27/2019 9:04 AM Estimated Blood Loss: Estimated blood loss: none. Estimated blood loss was                        minimal.      [facility]

## 2019-08-27 NOTE — H&P (Addendum)
[name], MD [address], [place], [place], [address] [address], [address], [place], [place], [address] Phone: [phone]  Fax: [phone]  Primary Care Physician:  Marjie Skiff, NP   Pre-Procedure History & Physical: HPI:  Nicole Butler is a 73 y.o. female is here for an endoscopy , 2 rounds of prior dilation . Still has some residual dysphagia but better.   Past Medical History:  Diagnosis Date  . Anemia    H/O   . Arthritis   . Broken femur (HCC)    left  . Complication of anesthesia    PT STATES SHE FORGETS TO BREATHE WHEN COMING OUT OF ANESTHESIA  . GERD (gastroesophageal reflux disease)   . Hip pain    left  . Insomnia   . Osteopenia   . Osteoporosis   . Thyroid disease     Past Surgical History:  Procedure Laterality Date  . ANKLE SURGERY Left   . ESOPHAGOGASTRODUODENOSCOPY (EGD) WITH PROPOFOL N/A 11/12/2018   Procedure: ESOPHAGOGASTRODUODENOSCOPY (EGD) WITH PROPOFOL;  Surgeon: [name], MD;  Location[facility] ENDOSCOPY;  Service: Gastroenterology;  Laterality: N/A;  . ESOPHAGOGASTRODUODENOSCOPY (EGD) WITH PROPOFOL N/A 12/17/2018   Procedure: ESOPHAGOGASTRODUODENOSCOPY (EGD) WITH PROPOFOL with Dilation;  Surgeon: [name], MD;  Location: [facility] ENDOSCOPY;  Service: Gastroenterology;  Laterality: N/A;  . ESOPHAGOGASTRODUODENOSCOPY (EGD) WITH PROPOFOL N/A 07/15/2019   Procedure: ESOPHAGOGASTRODUODENOSCOPY (EGD) WITH PROPOFOL with Dilation;  Surgeon: [name], MD;  Location[facility] ENDOSCOPY;  Service: Gastroenterology;  Laterality: N/A;  Per Dr. Tobi Bastos, include steroid injection post-dilation  . ESOPHAGOGASTRODUODENOSCOPY (EGD) WITH PROPOFOL N/A 08/05/2019   Procedure: ESOPHAGOGASTRODUODENOSCOPY (EGD) WITH PROPOFOL with Dilation;  Surgeon: [name], MD;  Location: [facility] ENDOSCOPY;  Service: Gastroenterology;  Laterality: N/A;  Per Dr. Tobi Bastos, keep Kenalog 10mg /ml for possible injection  . FOOT SURGERY Left    x3  . HARDWARE REMOVAL Left  12/21/2015   Procedure: HARDWARE REMOVAL;  Surgeon: 12/23/2015, MD;  Location: [facility] ORS;  Service: Orthopedics;  Laterality: Left;  Left HIP   . HIP SURGERY Left    x2  . KNEE SURGERY Right    x2  . LEG SURGERY Left    x2  . TOTAL HIP ARTHROPLASTY Right    x4    Prior to Admission medications   Medication Sig Start Date End Date Taking? Authorizing Provider  oxyCODONE-acetaminophen (PERCOCET/ROXICET) 5-325 MG tablet Take by mouth every 8 (eight) hours as needed.  01/09/17  Yes [provider]  diclofenac sodium (VOLTAREN) 1 % GEL Apply topically 4 (four) times daily.    [provider]  Lidocaine HCl 4 % SOLN Apply topically.    [provider]  linaclotide 03/11/17) 145 MCG CAPS capsule Take 1 capsule (145 mcg total) by mouth daily before breakfast. 12/14/18   02/12/19, MD  mupirocin ointment (BACTROBAN) 2 % Place 1 application into the nose 2 (two) times daily. 08/27/17   08/29/17, NP  naproxen sodium (ALEVE) 220 MG tablet Take 220 mg by mouth. Takes twice daily    [provider]  omeprazole (PRILOSEC) 40 MG capsule Take 1 capsule (40 mg total) by mouth 2 (two) times daily. 11/17/18   11/19/18, MD  ondansetron (ZOFRAN) 4 MG tablet Take 1 tablet (4 mg total) by mouth daily as needed for nausea or vomiting. 03/26/17   03/28/17 P, DO  oxyCODONE-acetaminophen (PERCOCET/ROXICET) 5-325 MG tablet oxycodone-acetaminophen 5 mg-325 mg tablet 06/12/18   [provider]  polyethylene  glycol (MIRALAX / GLYCOLAX) packet Take by mouth.    [provider]  tiZANidine (ZANAFLEX) 2 MG tablet tizanidine 2 mg tablet 06/12/18   [provider]  traZODone (DESYREL) 50 MG tablet Take 2 tablets (100 mg total) at bedtime by mouth. 09/23/17   Kathrine Haddock, NP    Allergies as of 08/23/2019 - Review Complete 08/05/2019  Allergen Reaction Noted  . Butrans [buprenorphine] Hives 06/11/2014  . Fosamax [alendronate sodium] Other (See  Comments) 08/29/2015    Family History  Problem Relation Age of Onset  . Alzheimer's disease Mother   . Cancer Father        lung    Social History   Socioeconomic History  . Marital status: Significant Other    Spouse name: Not on file  . Number of children: Not on file  . Years of education: Not on file  . Highest education level: Associate degree: academic program  Occupational History  . Not on file  Social Needs  . Financial resource strain: Not hard at all  . Food insecurity    Worry: Never true    Inability: Never true  . Transportation needs    Medical: No    Non-medical: No  Tobacco Use  . Smoking status: Former Smoker    Packs/day: 1.00    Years: 10.00    Pack years: 10.00    Types: Cigarettes    Quit date: 12/15/1995    Years since quitting: 23.7  . Smokeless tobacco: Never Used  Substance and Sexual Activity  . Alcohol use: Yes    Alcohol/week: 8.0 standard drinks    Types: 8 Glasses of wine per week    Comment: 2 GLASSES EVERY EVENING  . Drug use: No  . Sexual activity: Not on file  Lifestyle  . Physical activity    Days per week: 0 days    Minutes per session: 0 min  . Stress: Not at all  Relationships  . Social connections    Talks on phone: More than three times a week    Gets together: More than three times a week    Attends religious service: Never    Active member of club or organization: No    Attends meetings of clubs or organizations: Never    Relationship status: Living with partner  . Intimate partner violence    Fear of current or ex partner: No    Emotionally abused: No    Physically abused: No    Forced sexual activity: No  Other Topics Concern  . 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 General:   Alert,  pleasant and cooperative in NAD Head:   Normocephalic and atraumatic. Neck:  Supple; no masses or thyromegaly. Lungs:  Clear throughout to auscultation, normal respiratory effort.    Heart:  +S1, +S2, Regular rate and rhythm, No edema. Abdomen:  Soft, nontender and nondistended. Normal bowel sounds, without guarding, and without rebound.   Neurologic:  Alert and  oriented x4;  grossly normal neurologically.  Impression/Plan: CYNDI MONTEJANO is here for an endoscopy  to be performed for  evaluation of esophageal stricture and dilation    Risks, benefits, limitations, and alternatives regarding endoscopy have been reviewed with the patient.  Questions have been answered.  All parties agreeable.   Bailey Mech  Tobi BastosAnna, MD  08/27/2019, 8:56 AM

## 2019-08-27 NOTE — Anesthesia Post-op Follow-up Note (Signed)
Anesthesia QCDR form completed.        

## 2019-08-27 NOTE — Transfer of Care (Signed)
Immediate Anesthesia Transfer of Care Note  Patient: Nicole Butler  Procedure(s) Performed: ESOPHAGOGASTRODUODENOSCOPY (EGD) WITH PROPOFOL with Dilation (N/A )  Patient Location: PACU  Anesthesia Type:General  Level of Consciousness: sedated  Airway & Oxygen Therapy: Patient Spontanous Breathing  Post-op Assessment: Report given to RN and Post -op Vital signs reviewed and stable  Post vital signs: Reviewed and stable  Last Vitals:  Vitals Value Taken Time  BP 171/98 08/27/19 0934  Temp 36.4 C 08/27/19 0934  Pulse 85 08/27/19 0934  Resp 17 08/27/19 0934  SpO2 96 % 08/27/19 0934  Vitals shown include unvalidated device data.  Last Pain:  Vitals:   08/27/19 0933  TempSrc: Tympanic  PainSc:          Complications: No apparent anesthesia complications

## 2019-08-27 NOTE — Anesthesia Postprocedure Evaluation (Signed)
Anesthesia Post Note  Patient: Nicole Butler  Procedure(s) Performed: ESOPHAGOGASTRODUODENOSCOPY (EGD) WITH PROPOFOL with Dilation (N/A )  Patient location during evaluation: Endoscopy Anesthesia Type: General Level of consciousness: awake and alert Pain management: pain level controlled Vital Signs Assessment: post-procedure vital signs reviewed and stable Respiratory status: spontaneous breathing, nonlabored ventilation, respiratory function stable and patient connected to nasal cannula oxygen Cardiovascular status: blood pressure returned to baseline and stable Postop Assessment: no apparent nausea or vomiting Anesthetic complications: no     Last Vitals:  Vitals:   08/27/19 0953 08/27/19 1003  BP: (!) 171/94 (!) 170/93  Pulse: 86 80  Resp: 14 (!) 24  Temp:    SpO2: 99% 100%    Last Pain:  Vitals:   08/27/19 0953  TempSrc:   PainSc: 8                  Precious Haws Piscitello

## 2019-08-27 NOTE — Anesthesia Preprocedure Evaluation (Signed)
Anesthesia Evaluation  Patient identified by MRN, date of birth, ID band Patient awake    Reviewed: Allergy & Precautions, H&P , NPO status , Patient's Chart, lab work & pertinent test results  History of Anesthesia Complications Negative for: history of anesthetic complications  Airway Mallampati: III  TM Distance: >3 FB     Dental  (+) Upper Dentures, Lower Dentures   Pulmonary neg pulmonary ROS, neg COPD, former smoker,           Cardiovascular hypertension, (-) angina(-) Past MI and (-) Cardiac Stents (-) dysrhythmias      Neuro/Psych PSYCHIATRIC DISORDERS Anxiety Depression negative neurological ROS     GI/Hepatic Neg liver ROS, GERD  Controlled,  Endo/Other  negative endocrine ROS  Renal/GU negative Renal ROS  negative genitourinary   Musculoskeletal  (+) Arthritis ,   Abdominal   Peds  Hematology  (+) Blood dyscrasia, anemia ,   Anesthesia Other Findings Past Medical History: No date: Anemia     Comment:  H/O  No date: Arthritis No date: Broken femur (HCC)     Comment:  left No date: Complication of anesthesia     Comment:  PT STATES SHE FORGETS TO BREATHE WHEN COMING OUT OF               ANESTHESIA No date: GERD (gastroesophageal reflux disease) No date: Hip pain     Comment:  left No date: Insomnia No date: Osteopenia No date: Osteoporosis No date: Thyroid disease  Past Surgical History: No date: ANKLE SURGERY; Left 11/12/2018: ESOPHAGOGASTRODUODENOSCOPY (EGD) WITH PROPOFOL; N/A     Comment:  Procedure: ESOPHAGOGASTRODUODENOSCOPY (EGD) WITH               PROPOFOL;  Surgeon: Jonathon Bellows, MD;  Location[facility]               ENDOSCOPY;  Service: Gastroenterology;  Laterality: N/A; 12/17/2018: ESOPHAGOGASTRODUODENOSCOPY (EGD) WITH PROPOFOL; N/A     Comment:  Procedure: ESOPHAGOGASTRODUODENOSCOPY (EGD) WITH               PROPOFOL with Dilation;  Surgeon: Jonathon Bellows, MD;                Location: [facility]  ENDOSCOPY;  Service: Gastroenterology;                Laterality: N/A; 07/15/2019: ESOPHAGOGASTRODUODENOSCOPY (EGD) WITH PROPOFOL; N/A     Comment:  Procedure: ESOPHAGOGASTRODUODENOSCOPY (EGD) WITH               PROPOFOL with Dilation;  Surgeon: Jonathon Bellows, MD;                Location[facility] ENDOSCOPY;  Service: Gastroenterology;                Laterality: N/A;  Per Dr. Vicente Males, include steroid injection              post-dilation No date: FOOT SURGERY; Left     Comment:  x3 12/21/2015: HARDWARE REMOVAL; Left     Comment:  Procedure: HARDWARE REMOVAL;  Surgeon: Thornton Park,               MD;  Location: [facility] ORS;  Service: Orthopedics;                Laterality: Left;  Left HIP  No date: HIP SURGERY; Left     Comment:  x2 No date: KNEE SURGERY; Right     Comment:  x2 No date: LEG SURGERY; Left  Comment:  x2 No date: TOTAL HIP ARTHROPLASTY; Right     Comment:  x4  BMI    Body Mass Index: 14.57 kg/m      Reproductive/Obstetrics negative OB ROS                             Anesthesia Physical  Anesthesia Plan  ASA: III  Anesthesia Plan: General   Post-op Pain Management:    Induction: Intravenous  PONV Risk Score and Plan: Propofol infusion and TIVA  Airway Management Planned: Natural Airway and Nasal Cannula  Additional Equipment:   Intra-op Plan:   Post-operative Plan:   Informed Consent: I have reviewed the patients History and Physical, chart, labs and discussed the procedure including the risks, benefits and alternatives for the proposed anesthesia with the patient or authorized representative who has indicated his/her understanding and acceptance.     Dental Advisory Given  Plan Discussed with: Anesthesiologist and CRNA  Anesthesia Plan Comments: (Patient consented for risks of anesthesia including but not limited to:  - adverse reactions to medications - risk of intubation if required - damage to teeth, lips or other oral  mucosa - sore throat or hoarseness - Damage to heart, brain, lungs or loss of life  Patient voiced understanding.)        Anesthesia Quick Evaluation

## 2019-08-30 ENCOUNTER — Encounter: Payer: Self-pay | Admitting: Gastroenterology

## 2019-09-14 ENCOUNTER — Telehealth: Payer: Self-pay | Admitting: Internal Medicine

## 2019-09-14 NOTE — Telephone Encounter (Signed)
I called pt and left vm to call ofc to schedule NP appt with Dr Aundra Dubin per doctor. Thank you!

## 2019-09-15 ENCOUNTER — Telehealth: Payer: Self-pay | Admitting: Nurse Practitioner

## 2019-09-15 NOTE — Telephone Encounter (Signed)
Copied from [place] [id number]. Topic: Appointment Scheduling - Transfer of Care >> Sep 14, 2019  9:01 AM [name] wrote: Pt is requesting to transfer FROM: Nicole Butler  Pt is requesting to transfer TO: Dr. Arnette Norris Reason for requested transfer: Pt moved and the [place] office is closer. Please advise.   Send CRM to patient's current PCP (transferring FROM). >> Sep 15, 2019  3:52 PM Stark Klein wrote: [name]

## 2019-09-16 NOTE — Telephone Encounter (Signed)
Patient notified

## 2019-12-08 ENCOUNTER — Other Ambulatory Visit: Payer: Self-pay

## 2019-12-08 MED ORDER — OMEPRAZOLE 40 MG PO CPDR: 40.0000 mg | DELAYED_RELEASE_CAPSULE | Freq: Two times a day (BID) | ORAL | 1 refills | Status: DC

## 2019-12-15 ENCOUNTER — Encounter: Payer: Self-pay | Admitting: Internal Medicine

## 2019-12-15 ENCOUNTER — Ambulatory Visit: Payer: Medicare PPO | Admitting: Internal Medicine

## 2019-12-15 ENCOUNTER — Other Ambulatory Visit: Payer: Self-pay

## 2019-12-15 VITALS — Ht 67.0 in | Wt 88.0 lb

## 2019-12-15 DIAGNOSIS — K222 Esophageal obstruction: Secondary | ICD-10-CM

## 2019-12-15 DIAGNOSIS — G4700 Insomnia, unspecified: Secondary | ICD-10-CM

## 2019-12-15 DIAGNOSIS — G8929 Other chronic pain: Secondary | ICD-10-CM

## 2019-12-15 DIAGNOSIS — R110 Nausea: Secondary | ICD-10-CM

## 2019-12-15 DIAGNOSIS — Z13818 Encounter for screening for other digestive system disorders: Secondary | ICD-10-CM

## 2019-12-15 DIAGNOSIS — Z13220 Encounter for screening for lipoid disorders: Secondary | ICD-10-CM

## 2019-12-15 DIAGNOSIS — G894 Chronic pain syndrome: Secondary | ICD-10-CM

## 2019-12-15 DIAGNOSIS — M79605 Pain in left leg: Secondary | ICD-10-CM

## 2019-12-15 DIAGNOSIS — K219 Gastro-esophageal reflux disease without esophagitis: Secondary | ICD-10-CM

## 2019-12-15 DIAGNOSIS — K5909 Other constipation: Secondary | ICD-10-CM | POA: Insufficient documentation

## 2019-12-15 DIAGNOSIS — M1990 Unspecified osteoarthritis, unspecified site: Secondary | ICD-10-CM

## 2019-12-15 DIAGNOSIS — Z0000 Encounter for general adult medical examination without abnormal findings: Secondary | ICD-10-CM

## 2019-12-15 DIAGNOSIS — M810 Age-related osteoporosis without current pathological fracture: Secondary | ICD-10-CM

## 2019-12-15 DIAGNOSIS — Z1329 Encounter for screening for other suspected endocrine disorder: Secondary | ICD-10-CM

## 2019-12-15 DIAGNOSIS — E559 Vitamin D deficiency, unspecified: Secondary | ICD-10-CM

## 2019-12-15 DIAGNOSIS — M25512 Pain in left shoulder: Secondary | ICD-10-CM

## 2019-12-15 DIAGNOSIS — Z1389 Encounter for screening for other disorder: Secondary | ICD-10-CM

## 2019-12-15 MED ORDER — ONDANSETRON HCL 4 MG PO TABS: 4.0000 mg | ORAL_TABLET | Freq: Every day | ORAL | 5 refills | Status: DC | PRN

## 2019-12-15 MED ORDER — OMEPRAZOLE 40 MG PO CPDR: 40.0000 mg | DELAYED_RELEASE_CAPSULE | Freq: Two times a day (BID) | ORAL | 1 refills | Status: DC

## 2019-12-15 MED ORDER — TRAZODONE HCL 50 MG PO TABS: 100.0000 mg | ORAL_TABLET | Freq: Every day | ORAL | 3 refills | Status: AC

## 2019-12-15 NOTE — Progress Notes (Signed)
Telephone Note  I connected with Nicole Butler  on 12/15/19 at  2:15 PM EST by telephone and verified that I am speaking with the correct person using two identifiers.  Location patient: home Location provider:work or home office Persons participating in the virtual visit: patient, provider  I discussed the limitations of evaluation and management by telemedicine and the availability of in person appointments. The patient expressed understanding and agreed to proceed.   HPI: 1. New patient with chronic orthopedic issues and chronic pain since her 89s with multiple orthopedic surgeries f/u [facility] Dr. Raliegh Ip. She is in pain daily and f/u [facility] pain clinic on percocet 5-325 mg Rx tid taking bid  She had complicated hip surgeries on the right with h/o necrosis of the bone and reduced ROM ability to straighten leg due to prior ortho surgeries  She has an electric scooter, walker and rollator to get around She has chronic shoulder pain and left shoulder she wonders if has been dislocated as the right has been dislocated in the past even with mammograms which is why she declines to due further mammograms  2. H/o osteoporosis f/u Dr. Truddie Coco endocrine h/o reclast infusions x 2 at least declines further also with h/o esophageal stenosis and dilation with GI Dr. Vicente Males reviewed this could be side effect of these medications multiple EGDs shes had in 2020  3. Weight down from 132 to 88 lbs but for her 88 lbs is stable as she reports reduced po intake and she is not [place].   4. Chronic constipation 2/2 narcotics tried linzess which gave her diarrhea so now she does prn Miralax which helps   ROS: See pertinent positives and negatives per HPI. General: wt 88 lbs but stable reduced appetite  HEENT: nl hearing  CV: no cp Lungs: no sob  GI: dysphagia improved , +constipation  GU: no issues  MSK: chronic pain  Neuro: memory normal  Psych: insomnia needs refill trazadone  Skin: no current issues   Past  Medical History:  Diagnosis Date  . Anemia    H/O   . Arthritis   . Broken femur ([place])    left  . Chondrodermatitis nodularis helicis    right [facility]  . Complication of anesthesia    PT STATES SHE FORGETS TO BREATHE WHEN COMING OUT OF ANESTHESIA  . GERD (gastroesophageal reflux disease)   . Hip pain    left  . Insomnia   . Osteopenia   . Osteoporosis   . Thyroid disease     Past Surgical History:  Procedure Laterality Date  . ANKLE SURGERY Left   . ESOPHAGOGASTRODUODENOSCOPY (EGD) WITH PROPOFOL N/A 11/12/2018   Procedure: ESOPHAGOGASTRODUODENOSCOPY (EGD) WITH PROPOFOL;  Surgeon: Jonathon Bellows, MD;  Location[facility] ENDOSCOPY;  Service: Gastroenterology;  Laterality: N/A;  . ESOPHAGOGASTRODUODENOSCOPY (EGD) WITH PROPOFOL N/A 12/17/2018   Procedure: ESOPHAGOGASTRODUODENOSCOPY (EGD) WITH PROPOFOL with Dilation;  Surgeon: Jonathon Bellows, MD;  Location: [facility] ENDOSCOPY;  Service: Gastroenterology;  Laterality: N/A;  . ESOPHAGOGASTRODUODENOSCOPY (EGD) WITH PROPOFOL N/A 07/15/2019   Procedure: ESOPHAGOGASTRODUODENOSCOPY (EGD) WITH PROPOFOL with Dilation;  Surgeon: Jonathon Bellows, MD;  Location: [facility] ENDOSCOPY;  Service: Gastroenterology;  Laterality: N/A;  Per Dr. Vicente Males, include steroid injection post-dilation  . ESOPHAGOGASTRODUODENOSCOPY (EGD) WITH PROPOFOL N/A 08/05/2019   Procedure: ESOPHAGOGASTRODUODENOSCOPY (EGD) WITH PROPOFOL with Dilation;  Surgeon: Jonathon Bellows, MD;  Location: [facility] ENDOSCOPY;  Service: Gastroenterology;  Laterality: N/A;  Per Dr. Vicente Males, keep Kenalog 10mg /ml for possible injection  . ESOPHAGOGASTRODUODENOSCOPY (EGD) WITH PROPOFOL N/A 08/27/2019  Procedure: ESOPHAGOGASTRODUODENOSCOPY (EGD) WITH PROPOFOL with Dilation;  Surgeon: [name], MD;  Location: [facility] ENDOSCOPY;  Service: Gastroenterology;  Laterality: N/A;  . FOOT SURGERY Left    x3  . HARDWARE REMOVAL Left 12/21/2015   Procedure: HARDWARE REMOVAL;  Surgeon: Juanell Fairly, MD;  Location: [facility] ORS;  Service:  Orthopedics;  Laterality: Left;  Left HIP   . HIP SURGERY Left    x2  . KNEE SURGERY Right    x2  . LEG SURGERY Left    x2  . TOTAL HIP ARTHROPLASTY Right    x4    Family History  Problem Relation Age of Onset  . Alzheimer's disease Mother   . Cancer Father        lung never smoker    SOCIAL HX:  Lives with Nicole Butler  1 daughter [place], 1 son in [place]. Enjoyed training horses raised show horses, prev enjoyed water skiing/diving until her 18s when developed ortho issues  Current Outpatient Medications:  .  diclofenac sodium (VOLTAREN) 1 % GEL, Apply topically 4 (four) times daily., Disp: , Rfl:  .  Lidocaine HCl 4 % SOLN, Apply topically., Disp: , Rfl:  .  mupirocin ointment (BACTROBAN) 2 %, Place 1 application into the nose 2 (two) times daily., Disp: 22 g, Rfl: 0 .  omeprazole (PRILOSEC) 40 MG capsule, Take 1 capsule (40 mg total) by mouth 2 (two) times daily. 30 minutes before food, Disp: 180 capsule, Rfl: 1 .  ondansetron (ZOFRAN) 4 MG tablet, Take 1 tablet (4 mg total) by mouth daily as needed for nausea or vomiting., Disp: 20 tablet, Rfl: 5 .  oxyCODONE-acetaminophen (PERCOCET/ROXICET) 5-325 MG tablet, Take by mouth every 8 (eight) hours as needed. , Disp: , Rfl:  .  polyethylene glycol (MIRALAX / GLYCOLAX) packet, Take by mouth., Disp: , Rfl:  .  tiZANidine (ZANAFLEX) 2 MG tablet, tizanidine 2 mg tablet, Disp: , Rfl:  .  traZODone (DESYREL) 50 MG tablet, Take 2 tablets (100 mg total) by mouth at bedtime., Disp: 180 tablet, Rfl: 3  EXAM:  VITALS per patient if applicable:  GENERAL: alert, oriented, appears well and in no acute distress  PSYCH/NEURO: pleasant and cooperative, no obvious depression or anxiety, speech and thought processing grossly intact  ASSESSMENT AND PLAN:  Discussed the following assessment and plan:  Chronic pain of both shoulders with h/o chronic pain 2/2 h/o fractures with h/o osteoporosis/OA limited mobility/chronic pain in  hips - Plan: DG Shoulder Left, DG Shoulder Right F/u pain clinic [facility] percocet taking bid prn not tid prn For hip consider other ortho per pt Dr. Kirtland Bouchard stated need more specialized ortho and he has done all he can do for her with multiple surgeries  B/l hip Xrays ordered   Consider [facility] Dr. Paralee Cancel  [facility] ortho doctors Dr. Despina Hick or Dr. Charlann Boxer    Insomnia, unspecified type - Plan: traZODone (DESYREL) 50 MG tablet  Nausea - Plan: ondansetron (ZOFRAN) 4 MG tablet  Gastroesophageal reflux disease without esophagitis - Plan: omeprazole (PRILOSEC) 40 MG capsule bid will CC Dr. Tobi Bastos to see if wants this long term ? With h/o esophageal stricuture  Chronic constipation  Prn miralax   HM Flu shot utd  prevnar utd  pna 23 08/17/15  covid 19 vx consider in future  Shingrix, Tdap consider in future  Screen hep C EGD last EGD 08/27/19 esophageal stenosis dilated, multiple EGD 2020 Dr. Tobi Bastos Colonoscopy 04/30/17 polyp no path found  Wt 132 progressively  declined stable at 88 lbs due to reduced po. Monitor  mammo declines further no FH breast cancer  DEXA 08/16/15 h/o osteoporosis saw osteopenia h/o reclast x 2 in the past Dr. Donnie Coffin last 01/14/18 reclast infusion declines further due to h/o esophageal stenosis and dilated 08/27/19 Dr. 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 Pt prefers to treat via telemedicine empirically rather then risking or undertaking an in person visit at this moment. Patient agrees to seek prompt in person care if worsening, new symptoms arise, or if is not  improving with treatment.   I discussed the assessment and treatment plan with the patient. The patient was provided an opportunity to ask questions and all were answered. The patient agreed with the plan and demonstrated an understanding of the instructions.   The patient was advised to call back or seek an in-person evaluation if the symptoms worsen or if the condition fails to improve as anticipated.  Time 30 minutes  Bevelyn Buckles, MD

## 2019-12-15 NOTE — Patient Instructions (Addendum)
COVID-19 Vaccine Information can be found at: [email or web address] For questions related to vaccine distribution or appointments, please email [email or web address] or call [phone].    Consider call pharmacy for covid 19 vaccine if interested    Consider [facility] Dr. Paralee Cancel  [facility] ortho doctors Dr. Despina Hick or Dr. Charlann Boxer   Please try to take a multivitamin for women daily nature made or centrum   Consider pirq protein shakes from [employer]   Xrays both hips and shoulders ordered for [facility] outpatient imaging center [address]  Please wear 2 masks in public surgical and cloth if you can covering your nose and mouth

## 2019-12-16 ENCOUNTER — Telehealth: Payer: Self-pay

## 2019-12-16 NOTE — Telephone Encounter (Signed)
-----   Message from [name], MD sent at 12/16/2019  8:21 AM EST ----- [name]   Please call the patient and I would like to do either a video visit or an in person visit with her.  I want to check how she is doing after her last dilation since we dilated her to the maximum possible limit.  Depending on how she is doing we will plan to down titrate her PPI to the lowest dose that she would require.  C/c Dr Nickolas Madrid   Dr [name] MD,MRCP [facility]) Gastroenterology/Hepatology Pager: [phone] ----- Message ----- From: McLean-Scocuzza, Pasty Spillers, MD Sent: 12/15/2019  11:12 PM EST To: [name], MD  She is on bid ppi h/o esophageal stenosis but also on reclast in the past  Do you want her to stay on bid ppi forever is what is duration to titrate down to qd?   Thanks [employer]

## 2019-12-16 NOTE — Telephone Encounter (Signed)
Called pt to offer a follow up appointment with Dr. Tobi Bastos.  Unable to contact, LVM to call back to schedule

## 2020-01-06 ENCOUNTER — Ambulatory Visit (INDEPENDENT_AMBULATORY_CARE_PROVIDER_SITE_OTHER): Payer: Medicare PPO | Admitting: Gastroenterology

## 2020-01-06 DIAGNOSIS — K222 Esophageal obstruction: Secondary | ICD-10-CM

## 2020-01-06 DIAGNOSIS — K219 Gastro-esophageal reflux disease without esophagitis: Secondary | ICD-10-CM

## 2020-01-06 NOTE — Progress Notes (Signed)
Jonathon Bellows , MD [address]  [place]  [place], [place] [address]  Main: [phone]  Fax: [phone]   Primary Care Physician: McLean-Scocuzza, Nino Glow, MD  Virtual Visit via Telephone Note  I connected with patient on 01/06/20 at  1:00 PM EST by telephone and verified that I am speaking with the correct person using two identifiers.   I discussed the limitations, risks, security and privacy concerns of performing an evaluation and management service by telephone and the availability of in person appointments. I also discussed with the patient that there may be a patient responsible charge related to this service. The patient expressed understanding and agreed to proceed.  Location of Patient: Home Location of Provider: Home Persons involved: Patient and provider only   History of Present Illness:  GERD and dysphagia follow up    HPI: Nicole Butler is a 74 y.o. female    She was initially referred and seen on 10/29/18 for dysphagia ongoing for a year and opoid induced constipation   11/10/2018 : Normal TSH,Hb 13 grams   11/12/2018 : EGD:One benign-appearing, intrinsic moderate (stenosis was found at the gastroesophageal junction. This stenosis measured less than one cm.The stenosis was traversed. A TTS dilator was passed through the scope. Dilation with a 08-15-11 mm balloon dilator was performed to 11 mm. 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 Biopsies of the esophagus showed no eosinophilia 08/05/2019:EGD: GE junction stricture dilated from 21mm to 15 mm and injection of steroid into the stricture.  Interval history10/15/2020-01/06/2020  08/27/2019: EGD: GE junction stricture stretched to 18 mm- had bleeding and was sprayed with hemospray - barium swallow showed no perforation   She is taking her PPI prilosec 40 BID, no new issues, wanted to know if PPI to be taken long term      Current Outpatient Medications  Medication Sig Dispense Refill  . diclofenac sodium (VOLTAREN) 1 % GEL Apply topically 4 (four) times daily.    . Lidocaine HCl 4 % SOLN Apply topically.    . mupirocin ointment (BACTROBAN) 2 % Place 1 application into the nose 2 (two) times daily. 22 g 0  . omeprazole (PRILOSEC) 40 MG capsule Take 1 capsule (40 mg total) by mouth 2 (two) times daily. 30 minutes before food 180 capsule 1  . ondansetron (ZOFRAN) 4 MG tablet Take 1 tablet (4 mg total) by mouth daily as needed for nausea or vomiting. 20 tablet 5  . oxyCODONE-acetaminophen (PERCOCET/ROXICET) 5-325 MG tablet Take by mouth every 8 (eight) hours as needed.     . polyethylene glycol (MIRALAX / GLYCOLAX) packet Take by mouth.    Marland Kitchen tiZANidine (ZANAFLEX) 2 MG tablet tizanidine 2 mg tablet    . traZODone (DESYREL) 50 MG tablet Take 2 tablets (100 mg total) by mouth at bedtime. 180 tablet 3   No current facility-administered medications for this visit.    Allergies as of 01/06/2020 - Review Complete 12/15/2019  Allergen Reaction Noted  . Butrans [buprenorphine] Hives 06/11/2014  . Fosamax [alendronate sodium] Other (See Comments) 08/29/2015    Review of Systems:    All systems reviewed and negative except where noted in HPI.   Observations/Objective:  Labs: CMP     Component Value Date/Time   NA 128 (L) 04/24/2017 1117   NA 131 (L) 09/05/2014 0419   K 4.7 04/24/2017 1117   K 4.6 09/05/2014 0419   CL 89 (L) 04/24/2017  1117   CL 99 09/05/2014 0419   CO2 23 04/24/2017 1117   CO2 25 09/05/2014 0419   GLUCOSE 79 04/24/2017 1117   GLUCOSE 98 02/12/2016 0325   GLUCOSE 120 (H) 09/05/2014 0419   BUN 7 (L) 04/24/2017 1117   BUN 6 (L) 09/05/2014 0419   CREATININE 0.46 (L) 04/24/2017 1117   CREATININE 0.47 (L) 09/05/2014 0419   CALCIUM 9.4 04/24/2017 1117   CALCIUM 7.9 (L) 09/05/2014 0419   PROT 6.7 04/16/2017 1029   PROT 6.7 09/03/2014 2237   ALBUMIN 4.3 04/16/2017 1029   ALBUMIN 3.7  09/03/2014 2237   AST 23 04/16/2017 1029   AST 29 09/03/2014 2237   ALT 12 04/16/2017 1029   ALT 19 09/03/2014 2237   ALKPHOS 48 04/16/2017 1029   ALKPHOS 77 09/03/2014 2237   BILITOT 0.4 04/16/2017 1029   BILITOT 0.2 09/03/2014 2237   GFRNONAA 102 04/24/2017 1117   GFRNONAA >60 09/05/2014 0419   GFRAA 117 04/24/2017 1117   GFRAA >60 09/05/2014 0419   Lab Results  Component Value Date   WBC 7.3 11/10/2018   HGB 13.1 11/10/2018   HCT 37.4 11/10/2018   MCV 94 11/10/2018   PLT 342 11/10/2018    Imaging Studies: No results found.  Assessment and Plan:   Nicole Butler is a 74 y.o. y/o female  here to follow-up for dysphagia. 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 Prior to which I had stretched it to 12 mm.   Repeat dilation to 15 mm on 08/05/2019 with injection of steroid. Subsequently in 08/2019 dilated to 18 mm . Doing well and I believe she should stay on her Prilosec 40 mg BID long term as she is prone for recurrent stenosis and would prefer to avoid dilation too often if possible.   I discussed the assessment and treatment plan with the patient. The patient was provided an opportunity to ask questions and all were answered. The patient agreed with the plan and demonstrated an understanding of the instructions.   The patient was advised to call back or seek an in-person evaluation if the symptoms worsen or if the condition fails to improve as anticipated.  I provided 11 minutes of non-face-to-face time during this encounter.  Dr [name] MD,MRCP [facility]) Gastroenterology/Hepatology Pager: [phone]   Speech recognition software was used to dictate this note.

## 2020-02-10 ENCOUNTER — Telehealth: Payer: Self-pay | Admitting: Internal Medicine

## 2020-02-10 NOTE — Telephone Encounter (Signed)
Left message for patient to call back and schedule Medicare Annual Wellness Visit (AWV) either virtually or audio only.  Last AWV 06/17/18; please schedule at anytime with Denisa O'Brien-Blaney at [facility].

## 2020-02-15 ENCOUNTER — Ambulatory Visit (INDEPENDENT_AMBULATORY_CARE_PROVIDER_SITE_OTHER): Payer: Medicare PPO

## 2020-02-15 VITALS — Ht 67.0 in | Wt 88.0 lb

## 2020-02-15 DIAGNOSIS — Z0000 Encounter for general adult medical examination without abnormal findings: Secondary | ICD-10-CM

## 2020-02-15 NOTE — Progress Notes (Addendum)
Subjective:   Nicole Butler is a 74 y.o. female who presents for Medicare Annual (Subsequent) preventive examination.  Review of Systems:  No ROS.  Medicare Wellness Virtual Visit.  Visual/audio telehealth visit, UTA vital signs.   Ht/Wt provided. See social history for additional risk factors.   Cardiac Risk Factors include: advanced age (>62men, >66 women)     Objective:     Vitals: Ht 5\' 7"  (1.702 m)   Wt 88 lb (39.9 kg)   BMI 13.78 kg/m   Body mass index is 13.78 kg/m.  Advanced Directives 02/15/2020 08/05/2019 07/15/2019 12/17/2018 11/12/2018 06/17/2018 04/16/2017  Does Patient Have a Medical Advance Directive? Yes Yes Yes Yes Yes Yes Yes  Type of 04/18/2017 of [place];Living will Healthcare Power of [place];Living will - Healthcare Power of [place];Living will Healthcare Power of [place];Living will Living will;Healthcare Power of Attorney Living will;Healthcare Power of Attorney  Does patient want to make changes to medical advance directive? No - Patient declined - - - - - -  Copy of Healthcare Power of Attorney in Chart? No - copy requested - - No - copy requested No - copy requested No - copy requested -    Tobacco Social History   Tobacco Use  Smoking Status Former Smoker  . Packs/day: 1.00  . Years: 10.00  . Pack years: 10.00  . Types: Cigarettes  . Quit date: 12/15/1995  . Years since quitting: 24.1  Smokeless Tobacco Never Used     Counseling given: Not Answered   Clinical Intake:  Pre-visit preparation completed: Yes        Diabetes: No  How often do you need to have someone help you when you read instructions, pamphlets, or other written materials from your doctor or pharmacy?: 1 - Never  Interpreter Needed?: No     Past Medical History:  Diagnosis Date  . Anemia    H/O   . Arthritis   . Broken femur (HCC)    left  . Chondrodermatitis nodularis helicis    right [facility]  . Complication of anesthesia    PT  STATES SHE FORGETS TO BREATHE WHEN COMING OUT OF ANESTHESIA  . GERD (gastroesophageal reflux disease)   . Hip pain    left  . Insomnia   . Osteopenia   . Osteoporosis   . Thyroid disease    Past Surgical History:  Procedure Laterality Date  . ANKLE SURGERY Left   . ESOPHAGOGASTRODUODENOSCOPY (EGD) WITH PROPOFOL N/A 11/12/2018   Procedure: ESOPHAGOGASTRODUODENOSCOPY (EGD) WITH PROPOFOL;  Surgeon: 01/11/2019, MD;  Location: [facility] ENDOSCOPY;  Service: Gastroenterology;  Laterality: N/A;  . ESOPHAGOGASTRODUODENOSCOPY (EGD) WITH PROPOFOL N/A 12/17/2018   Procedure: ESOPHAGOGASTRODUODENOSCOPY (EGD) WITH PROPOFOL with Dilation;  Surgeon: 12/19/2018, MD;  Location: [facility] ENDOSCOPY;  Service: Gastroenterology;  Laterality: N/A;  . ESOPHAGOGASTRODUODENOSCOPY (EGD) WITH PROPOFOL N/A 07/15/2019   Procedure: ESOPHAGOGASTRODUODENOSCOPY (EGD) WITH PROPOFOL with Dilation;  Surgeon: 09/14/2019, MD;  Location: [facility] ENDOSCOPY;  Service: Gastroenterology;  Laterality: N/A;  Per Dr. OTTO [facility], include steroid injection post-dilation  . ESOPHAGOGASTRODUODENOSCOPY (EGD) WITH PROPOFOL N/A 08/05/2019   Procedure: ESOPHAGOGASTRODUODENOSCOPY (EGD) WITH PROPOFOL with Dilation;  Surgeon: 10/05/2019, MD;  Location: [facility] ENDOSCOPY;  Service: Gastroenterology;  Laterality: N/A;  Per Dr. OTTO [facility], keep Kenalog 10mg /ml for possible injection  . ESOPHAGOGASTRODUODENOSCOPY (EGD) WITH PROPOFOL N/A 08/27/2019   Procedure: ESOPHAGOGASTRODUODENOSCOPY (EGD) WITH PROPOFOL with Dilation;  Surgeon: , MD;  Location: [facility] ENDOSCOPY;  Service: Gastroenterology;  Laterality: N/A;  . FOOT  SURGERY Left    x3  . HARDWARE REMOVAL Left 12/21/2015   Procedure: HARDWARE REMOVAL;  Surgeon: Thornton Park, MD;  Location: [facility] ORS;  Service: Orthopedics;  Laterality: Left;  Left HIP   . HIP SURGERY Left    x2  . KNEE SURGERY Right    x2  . LEG SURGERY Left    x2  . TOTAL HIP ARTHROPLASTY Right    x4   Family History  Problem Relation Age  of Onset  . Alzheimer's disease Mother   . Cancer Father        lung never smoker   Social History   Socioeconomic History  . Marital status: Significant Other    Spouse name: Not on file  . Number of children: Not on file  . Years of education: Not on file  . Highest education level: Associate degree: academic program  Occupational History  . Not on file  Tobacco Use  . Smoking status: Former Smoker    Packs/day: 1.00    Years: 10.00    Pack years: 10.00    Types: Cigarettes    Quit date: 12/15/1995    Years since quitting: 24.1  . Smokeless tobacco: Never Used  Substance and Sexual Activity  . Alcohol use: Yes    Alcohol/week: 8.0 standard drinks    Types: 8 Glasses of wine per week    Comment: 2 GLASSES EVERY EVENING  . Drug use: No  . Sexual activity: Not on file  Other Topics Concern  . Not on file  Social History Narrative   Lives with Hilton [place]    1 daughter [place], 1 son in [place]. Enjoyed training horses raised show horses, prev enjoyed water skiing/diving until her 37s when developed ortho issues   Social Determinants of Radio broadcast assistant Strain:   . Difficulty of Paying Living Expenses:   Food Insecurity:   . Worried About Charity fundraiser in the Last Year:   . Arboriculturist in the Last Year:   Transportation Needs:   . Film/video editor (Medical):   Marland Kitchen Lack of Transportation (Non-Medical):   Physical Activity:   . Days of Exercise per Week:   . Minutes of Exercise per Session:   Stress:   . Feeling of Stress :   Social Connections:   . Frequency of Communication with Friends and Family:   . Frequency of Social Gatherings with Friends and Family:   . Attends Religious Services:   . Active Member of Clubs or Organizations:   . Attends Archivist Meetings:   Marland Kitchen Marital Status:     Outpatient Encounter Medications as of 02/15/2020  Medication Sig  . diclofenac sodium (VOLTAREN) 1 % GEL Apply topically 4  (four) times daily.  . Lidocaine HCl 4 % SOLN Apply topically.  . mupirocin ointment (BACTROBAN) 2 % Place 1 application into the nose 2 (two) times daily.  Marland Kitchen omeprazole (PRILOSEC) 40 MG capsule Take 1 capsule (40 mg total) by mouth 2 (two) times daily. 30 minutes before food  . ondansetron (ZOFRAN) 4 MG tablet Take 1 tablet (4 mg total) by mouth daily as needed for nausea or vomiting.  Marland Kitchen oxyCODONE-acetaminophen (PERCOCET/ROXICET) 5-325 MG tablet Take by mouth every 8 (eight) hours as needed.   . polyethylene glycol (MIRALAX / GLYCOLAX) packet Take by mouth.  Marland Kitchen tiZANidine (ZANAFLEX) 2 MG tablet tizanidine 2 mg tablet  . traZODone (DESYREL) 50 MG tablet Take  2 tablets (100 mg total) by mouth at bedtime.   No facility-administered encounter medications on file as of 02/15/2020.    Activities of Daily Living In your present state of health, do you have any difficulty performing the following activities: 02/15/2020  Hearing? N  Vision? N  Difficulty concentrating or making decisions? N  Walking or climbing stairs? Y  Dressing or bathing? N  Doing errands, shopping? Y  Preparing Food and eating ? N  Using the Toilet? N  In the past six months, have you accidently leaked urine? Y  Comment Managed with pads as needed  Do you have problems with loss of bowel control? N  Managing your Medications? N  Managing your Finances? N  Housekeeping or managing your Housekeeping? N  Comment Paces self. Assisted as needed.  Some recent data might be hidden    Patient Care Team: McLean-Scocuzza, Pasty Spillers, MD as PCP - General (Internal Medicine) Gabriel Cirri, NP as PCP - Family Medicine (Nurse Practitioner) Buena Irish, MD (Student) Juanell Fairly, MD as Referring Physician (Orthopedic Surgery) Vanna Scotland, MD as Consulting Physician (Urology)    Assessment:   This is a routine wellness examination for [place].  Nurse connected with patient 02/15/20 at 11:00 AM EDT by a telephone enabled  telemedicine application and verified that I am speaking with the correct person using two identifiers. Patient stated full name and DOB. Patient gave permission to continue with virtual visit. Patient's location was at home and Nurse's location was at [place] office.   Patient is alert and oriented x3.  Health Maintenance Due: -Tdap vaccine- discontinued per patient request.  -Hepatitis C Screening- discussed; states she will consider. See completed HM at the end of note.   Eye: Visual acuity not assessed. Virtual visit. Followed by their ophthalmologist.  Dental: Dentures- yes  Hearing: Demonstrates normal hearing during visit.  Safety:  Patient feels safe at home- yes Adequate lighting in walkways free from debris- yes Grab bars and handrails used as appropriate- yes Ambulates with an assistive device- yes; cane, walker, rollater, scooter. Paces self when climbing stairs or walking on uneven ground.   Social: Alcohol intake - yes      Smoking history- former  Illicit drug use? none  Medication: Taking as directed and without issues.  Self managed - yes   Covid-19: Precautions and sickness symptoms discussed. Wears mask, social distancing, hand hygiene as appropriate.   Activities of Daily Living Assisted as needed.    Discussed the importance of a healthy diet, water intake and the benefits of aerobic exercise.    Physical activity- walking around the block about 15 minutes.  Diet:  Healthy Water: fair- encouraged to stay hydrated  Caffeine: 2 cups of coffee  Other Providers Patient Care Team: McLean-Scocuzza, Pasty Spillers, MD as PCP - General (Internal Medicine) Gabriel Cirri, NP as PCP - Family Medicine (Nurse Practitioner) Buena Irish, MD (Student) Juanell Fairly, MD as Referring Physician (Orthopedic Surgery) Vanna Scotland, MD as Consulting Physician (Urology)  Exercise Activities and Dietary recommendations Current Exercise Habits: Home exercise  routine, Type of exercise: walking, Time (Minutes): 15, Intensity: Mild  Goals    . DIET - INCREASE WATER INTAKE     Recommend drinking at least 6-8 glasses of water a day        Fall Risk Fall Risk  02/15/2020 12/15/2019 06/17/2018 08/27/2017 04/16/2017  Falls in the past year? 0 0 Yes Yes Yes  Number falls in past yr: - - 2 or more 2  or more 2 or more  Injury with Fall? - - Yes Yes Yes  Comment - - - possible broken ribs -  Risk Factor Category  - - High Fall Risk - High Fall Risk  Risk for fall due to : - - History of fall(s);Impaired mobility;Impaired balance/gait - Impaired balance/gait;Impaired mobility  Follow up Falls evaluation completed Falls evaluation completed Falls prevention discussed;Falls evaluation completed;Education provided - -    Timed Get Up and Go performed: no, virtual visit  Depression Screen PHQ 2/9 Scores 02/15/2020 12/15/2019 09/22/2018 06/17/2018  PHQ - 2 Score 0 0 3 0  PHQ- 9 Score - - 12 -     Cognitive Function MMSE - Mini Mental State Exam 02/15/2020  Not completed: Unable to complete     6CIT Screen 06/17/2018  What Year? 0 points  What month? 0 points  What time? 0 points  Count back from 20 0 points  Months in reverse 0 points  Repeat phrase 0 points  Total Score 0    Immunization History  Administered Date(s) Administered  . Influenza, High Dose Seasonal PF 08/27/2017, 08/17/2018, 06/29/2019  . Influenza-Unspecified 08/17/2015, 06/19/2016  . PFIZER SARS-COV-2 Vaccination 01/04/2020, 01/25/2020  . Pneumococcal Conjugate-13 10/03/2014  . Pneumococcal Polysaccharide-23 08/17/2015   Screening Tests Health Maintenance  Topic Date Due  . Hepatitis C Screening  Never done  . INFLUENZA VACCINE  06/04/2020  . COLONOSCOPY  05/01/2027  . DEXA SCAN  Completed  . PNA vac Low Risk Adult  Completed  . MAMMOGRAM  Discontinued  . TETANUS/TDAP  Discontinued       Plan:   Keep all routine maintenance appointments.   Patient asked for ortho  information PMD recommends. Per last note considerations were [facility] and [facility]. [facility] information declined. Provided [facility] recommended Dr. Despina Hick or Dr.Olin. Patient agrees to contact [facility] [phone].  Patient asked for xray information recommended, no appointment needed. Per last note xray both hips and shoulders ordered for [facility] [address]. Called and left this information on patients voicemail, HIPAA complaint. Encouraged to call the office back with any questions or concerns.   Offered follow up appointment with labs and PMD, declined. Encouraged to follow up as recommended and needed.   Per last note return for fasting labs w/in 1-3 months and f/u in 3-6 months. Last visit 12/15/19.  Patient to schedule.   Medicare Attestation I have personally reviewed: The patient's medical and social history Their use of alcohol, tobacco or illicit drugs Their current medications and supplements The patient's functional ability ADLs  Diet and physical activities The evidence for depression   I have reviewed and discussed with patient certain preventive protocols, quality metrics, and best practice recommendations.   Ashok Pall, LPN  1/69/6789  Agree TMS

## 2020-02-15 NOTE — Patient Instructions (Addendum)
  Nicole Butler , Thank you for taking time to come for your Medicare Wellness Visit. I appreciate your ongoing commitment to your health goals. Please review the following plan we discussed and let me know if I can assist you in the future.   These are the goals we discussed: Goals    . DIET - INCREASE WATER INTAKE     Recommend drinking at least 6-8 glasses of water a day        This is a list of the screening recommended for you and due dates:  Health Maintenance  Topic Date Due  .  Hepatitis C: One time screening is recommended by Center for Disease Control  (CDC) for  adults born from [birth year] through 1964.   Never done  . Flu Shot  06/04/2020  . Colon Cancer Screening  05/01/2027  . DEXA scan (bone density measurement)  Completed  . Pneumonia vaccines  Completed  . Mammogram  Discontinued  . Tetanus Vaccine  Discontinued

## 2020-02-21 ENCOUNTER — Telehealth: Payer: Self-pay | Admitting: Internal Medicine

## 2020-02-21 ENCOUNTER — Other Ambulatory Visit: Payer: Self-pay | Admitting: Internal Medicine

## 2020-02-21 DIAGNOSIS — G8929 Other chronic pain: Secondary | ICD-10-CM

## 2020-02-21 NOTE — Telephone Encounter (Signed)
All Xrays at [facility] [place] location can walk in   What about her lower leg? Which lower leg and what are her symptoms?   She will need to follow up with ortho after the Xrays I'd previously given her the name of ones I preferred  Does she want referral to ortho we previously discussed either at [facility] ortho in [place], ortho in [place]?   TMS

## 2020-02-21 NOTE — Telephone Encounter (Signed)
Pt called and wanted to add xray for her knees and lower leg

## 2020-02-21 NOTE — Telephone Encounter (Signed)
Previous note regarding issue: Chronic pain of both shoulders with h/o chronic pain 2/2 h/o fractures with h/o osteoporosis/OA limited mobility/chronic pain in hips - Plan: DG Shoulder Left, DG Shoulder Right F/u pain clinic [facility] percocet taking bid prn not tid prn For hip consider other ortho per pt Dr. Kirtland Bouchard stated need more specialized ortho and he has done all he can do for her with multiple surgeries  B/l hip Xrays ordered    Please advise on x-ray of knees and legs

## 2020-02-23 NOTE — Telephone Encounter (Signed)
Patient informed and verbalized understanding.   States it is the lower left leg. She has had multiple breaks on that leg along with surgery on the left foot (two surgeris on the toe and two on the ankle).   Pt states she would like a referral to Ortho in [place], preferably one with [place]. She states she would like someone good as the last Orthopedic Surgeon she saw at [facility] stated her case was to complicated should she need more surgery on her hips.

## 2020-02-24 ENCOUNTER — Other Ambulatory Visit: Payer: Self-pay | Admitting: Internal Medicine

## 2020-02-24 DIAGNOSIS — M79605 Pain in left leg: Secondary | ICD-10-CM

## 2020-02-24 NOTE — Telephone Encounter (Signed)
6 Xrays in [facility] outpatient imaging she can walk in to get those done  Did not ordered Xray of foot she can disc with ortho this in the future or see podiatry   Will refer ortho in [place]

## 2020-02-24 NOTE — Addendum Note (Signed)
Addended by: Quentin Ore on: 02/24/2020 06:15 PM   Modules accepted: Orders

## 2020-02-25 NOTE — Telephone Encounter (Signed)
Left message to return call 

## 2020-03-06 NOTE — Telephone Encounter (Signed)
Spoke with patient and she states that [facility] informed her that they have an x-ray machine there and can complete these images if we send the orders there. Orders printed and faxed to their office.   Patient states she has not been scheduled yet and will do so now that the orders are sent. Gave patient the number to call: [phone].   Phone: [phone]  Phone: [phone]  Fax: [phone]

## 2020-03-06 NOTE — Telephone Encounter (Signed)
Left message to return call 

## 2020-03-06 NOTE — Telephone Encounter (Signed)
Faxed xray orders to [facility] on 03/06/20

## 2020-03-06 NOTE — Telephone Encounter (Signed)
Pt returned your call and  would like a call back today 

## 2020-03-24 ENCOUNTER — Telehealth: Payer: Self-pay | Admitting: Internal Medicine

## 2020-03-24 NOTE — Telephone Encounter (Signed)
Inform pt [facility] deferred referral she may need to go to [facility]

## 2020-03-24 NOTE — Telephone Encounter (Signed)
Rejection Reason - Unapproved Service - Doctor feels that the complexity warrants a Medical Center with a team to coordinate care. " [facility] [place] said 18 minutes ago

## 2020-03-27 NOTE — Telephone Encounter (Signed)
Patient informed and verbalized understanding.  States she will look in to those places herself and then will give [place] a call on who she would like to go to next.

## 2020-03-28 NOTE — Telephone Encounter (Signed)
Please advise 

## 2020-03-28 NOTE — Telephone Encounter (Signed)
Pt called back she would like to be referred to a [facility]

## 2020-04-05 DIAGNOSIS — M1610 Unilateral primary osteoarthritis, unspecified hip: Secondary | ICD-10-CM | POA: Diagnosis not present

## 2020-04-05 DIAGNOSIS — F1190 Opioid use, unspecified, uncomplicated: Secondary | ICD-10-CM | POA: Diagnosis not present

## 2020-04-05 DIAGNOSIS — G4700 Insomnia, unspecified: Secondary | ICD-10-CM | POA: Diagnosis not present

## 2020-04-05 DIAGNOSIS — M62838 Other muscle spasm: Secondary | ICD-10-CM | POA: Diagnosis not present

## 2020-04-05 DIAGNOSIS — M5136 Other intervertebral disc degeneration, lumbar region: Secondary | ICD-10-CM | POA: Diagnosis not present

## 2020-04-05 DIAGNOSIS — G894 Chronic pain syndrome: Secondary | ICD-10-CM | POA: Diagnosis not present

## 2020-04-10 NOTE — Telephone Encounter (Signed)
Referred [facility] Dr. Jeannette How   TMS

## 2020-04-10 NOTE — Addendum Note (Signed)
Addended by: Quentin Ore on: 04/10/2020 10:35 PM   Modules accepted: Orders

## 2020-04-17 NOTE — Telephone Encounter (Signed)
Ok referral was sent!

## 2020-06-29 DIAGNOSIS — G894 Chronic pain syndrome: Secondary | ICD-10-CM | POA: Diagnosis not present

## 2020-06-29 DIAGNOSIS — M545 Low back pain: Secondary | ICD-10-CM | POA: Diagnosis not present

## 2020-06-29 DIAGNOSIS — M62838 Other muscle spasm: Secondary | ICD-10-CM | POA: Diagnosis not present

## 2020-06-29 DIAGNOSIS — G4700 Insomnia, unspecified: Secondary | ICD-10-CM | POA: Diagnosis not present

## 2020-06-30 DIAGNOSIS — H2513 Age-related nuclear cataract, bilateral: Secondary | ICD-10-CM | POA: Diagnosis not present

## 2020-06-30 DIAGNOSIS — H16223 Keratoconjunctivitis sicca, not specified as Sjogren's, bilateral: Secondary | ICD-10-CM | POA: Diagnosis not present

## 2020-06-30 DIAGNOSIS — H43813 Vitreous degeneration, bilateral: Secondary | ICD-10-CM | POA: Diagnosis not present

## 2020-07-31 NOTE — Addendum Note (Signed)
Addended by: Quentin Ore on: 07/31/2020 05:15 PM   Modules accepted: Orders

## 2020-08-25 ENCOUNTER — Telehealth: Payer: Self-pay | Admitting: Internal Medicine

## 2020-08-25 NOTE — Telephone Encounter (Signed)
lft vm to follow up about pt scheduled at [facility] orthopedic surgery.

## 2020-08-28 DIAGNOSIS — M25551 Pain in right hip: Secondary | ICD-10-CM | POA: Diagnosis not present

## 2020-08-28 DIAGNOSIS — M25552 Pain in left hip: Secondary | ICD-10-CM | POA: Diagnosis not present

## 2020-08-28 DIAGNOSIS — S72142S Displaced intertrochanteric fracture of left femur, sequela: Secondary | ICD-10-CM | POA: Diagnosis not present

## 2020-08-28 DIAGNOSIS — Z96641 Presence of right artificial hip joint: Secondary | ICD-10-CM | POA: Diagnosis not present

## 2020-09-25 DIAGNOSIS — G894 Chronic pain syndrome: Secondary | ICD-10-CM | POA: Diagnosis not present

## 2020-09-25 DIAGNOSIS — M25562 Pain in left knee: Secondary | ICD-10-CM | POA: Diagnosis not present

## 2020-09-25 DIAGNOSIS — M1610 Unilateral primary osteoarthritis, unspecified hip: Secondary | ICD-10-CM | POA: Diagnosis not present

## 2020-09-25 DIAGNOSIS — F1190 Opioid use, unspecified, uncomplicated: Secondary | ICD-10-CM | POA: Diagnosis not present

## 2020-09-25 DIAGNOSIS — M25561 Pain in right knee: Secondary | ICD-10-CM | POA: Diagnosis not present

## 2020-09-25 DIAGNOSIS — G4700 Insomnia, unspecified: Secondary | ICD-10-CM | POA: Diagnosis not present

## 2020-09-25 DIAGNOSIS — G8929 Other chronic pain: Secondary | ICD-10-CM | POA: Diagnosis not present

## 2020-12-08 DIAGNOSIS — M2011 Hallux valgus (acquired), right foot: Secondary | ICD-10-CM | POA: Diagnosis not present

## 2020-12-08 DIAGNOSIS — M2041 Other hammer toe(s) (acquired), right foot: Secondary | ICD-10-CM | POA: Diagnosis not present

## 2020-12-08 DIAGNOSIS — M216X1 Other acquired deformities of right foot: Secondary | ICD-10-CM | POA: Diagnosis not present

## 2020-12-08 DIAGNOSIS — M205X2 Other deformities of toe(s) (acquired), left foot: Secondary | ICD-10-CM | POA: Diagnosis not present

## 2020-12-08 DIAGNOSIS — M79674 Pain in right toe(s): Secondary | ICD-10-CM | POA: Diagnosis not present

## 2020-12-08 DIAGNOSIS — L603 Nail dystrophy: Secondary | ICD-10-CM | POA: Diagnosis not present

## 2020-12-08 DIAGNOSIS — B351 Tinea unguium: Secondary | ICD-10-CM | POA: Diagnosis not present

## 2020-12-08 DIAGNOSIS — M79675 Pain in left toe(s): Secondary | ICD-10-CM | POA: Diagnosis not present

## 2020-12-08 DIAGNOSIS — M2012 Hallux valgus (acquired), left foot: Secondary | ICD-10-CM | POA: Diagnosis not present

## 2020-12-19 ENCOUNTER — Other Ambulatory Visit: Payer: Self-pay | Admitting: Internal Medicine

## 2020-12-19 DIAGNOSIS — R110 Nausea: Secondary | ICD-10-CM

## 2020-12-19 DIAGNOSIS — K219 Gastro-esophageal reflux disease without esophagitis: Secondary | ICD-10-CM

## 2021-01-08 DIAGNOSIS — Z0289 Encounter for other administrative examinations: Secondary | ICD-10-CM | POA: Diagnosis not present

## 2021-01-08 DIAGNOSIS — G4700 Insomnia, unspecified: Secondary | ICD-10-CM | POA: Diagnosis not present

## 2021-01-08 DIAGNOSIS — G894 Chronic pain syndrome: Secondary | ICD-10-CM | POA: Diagnosis not present

## 2021-01-08 DIAGNOSIS — M8949 Other hypertrophic osteoarthropathy, multiple sites: Secondary | ICD-10-CM | POA: Diagnosis not present

## 2021-01-08 DIAGNOSIS — M62838 Other muscle spasm: Secondary | ICD-10-CM | POA: Diagnosis not present

## 2021-02-15 ENCOUNTER — Other Ambulatory Visit: Payer: Self-pay

## 2021-02-15 ENCOUNTER — Ambulatory Visit (INDEPENDENT_AMBULATORY_CARE_PROVIDER_SITE_OTHER): Payer: Medicare PPO

## 2021-02-15 VITALS — Ht 67.0 in

## 2021-02-15 DIAGNOSIS — Z13220 Encounter for screening for lipoid disorders: Secondary | ICD-10-CM | POA: Diagnosis not present

## 2021-02-15 DIAGNOSIS — Z1159 Encounter for screening for other viral diseases: Secondary | ICD-10-CM

## 2021-02-15 DIAGNOSIS — Z0000 Encounter for general adult medical examination without abnormal findings: Secondary | ICD-10-CM

## 2021-02-15 DIAGNOSIS — Z1329 Encounter for screening for other suspected endocrine disorder: Secondary | ICD-10-CM

## 2021-02-15 LAB — COMPREHENSIVE METABOLIC PANEL
ALT: 10 U/L (ref 0–35)
AST: 18 U/L (ref 0–37)
Albumin: 4.1 g/dL (ref 3.5–5.2)
Alkaline Phosphatase: 45 U/L (ref 39–117)
BUN: 8 mg/dL (ref 6–23)
CO2: 29 mEq/L (ref 19–32)
Calcium: 9.4 mg/dL (ref 8.4–10.5)
Chloride: 97 mEq/L (ref 96–112)
Creatinine, Ser: 0.55 mg/dL (ref 0.40–1.20)
GFR: 90.98 mL/min (ref >60.00)
Glucose, Bld: 84 mg/dL (ref 70–99)
Potassium: 4.3 mEq/L (ref 3.5–5.1)
Sodium: 134 mEq/L — ABNORMAL LOW (ref 135–145)
Total Bilirubin: 0.5 mg/dL (ref 0.2–1.2)
Total Protein: 6.7 g/dL (ref 6.0–8.3)

## 2021-02-15 LAB — CBC WITH DIFFERENTIAL/PLATELET
Basophils Absolute: 0 10*3/uL (ref 0.0–0.1)
Basophils Relative: 0.6 % (ref 0.0–3.0)
Eosinophils Absolute: 0 10*3/uL (ref 0.0–0.7)
Eosinophils Relative: 0.4 % (ref 0.0–5.0)
HCT: 38.8 % (ref 36.0–46.0)
Hemoglobin: 12.8 g/dL (ref 12.0–15.0)
Lymphocytes Relative: 25.6 % (ref 12.0–46.0)
Lymphs Abs: 2.2 10*3/uL (ref 0.7–4.0)
MCHC: 33 g/dL (ref 30.0–36.0)
MCV: 93.4 fl (ref 78.0–100.0)
Monocytes Absolute: 0.6 10*3/uL (ref 0.1–1.0)
Monocytes Relative: 7.5 % (ref 3.0–12.0)
Neutro Abs: 5.5 10*3/uL (ref 1.4–7.7)
Neutrophils Relative %: 65.9 % (ref 43.0–77.0)
Platelets: 296 10*3/uL (ref 150.0–400.0)
RBC: 4.15 Mil/uL (ref 3.87–5.11)
RDW: 14.4 % (ref 11.5–15.5)
WBC: 8.4 10*3/uL (ref 4.0–10.5)

## 2021-02-15 LAB — LIPID PANEL
Cholesterol: 188 mg/dL (ref 0–200)
HDL: 74.3 mg/dL (ref >39.00)
LDL Cholesterol: 94 mg/dL (ref 0–99)
NonHDL: 113.31
Total CHOL/HDL Ratio: 3
Triglycerides: 99 mg/dL (ref 0.0–149.0)
VLDL: 19.8 mg/dL (ref 0.0–40.0)

## 2021-02-15 LAB — T4, FREE: Free T4: 1 ng/dL (ref 0.60–1.60)

## 2021-02-15 LAB — TSH: TSH: 0.64 u[IU]/mL (ref 0.35–4.50)

## 2021-02-15 NOTE — Progress Notes (Addendum)
Subjective:   Nicole Butler is a 75 y.o. female who presents for Medicare Annual (Subsequent) preventive examination.  Review of Systems    No ROS.  Medicare Wellness Virtual Visit.   Cardiac Risk Factors include: advanced age (>57men, >69 women)     Objective:    Today's Vitals   02/15/21 1309  Height: 5\' 7"  (1.702 m)   Body mass index is 13.78 kg/m.  Advanced Directives 02/15/2021 02/15/2020 08/05/2019 07/15/2019 12/17/2018 11/12/2018 06/17/2018  Does Patient Have a Medical Advance Directive? Yes Yes Yes Yes Yes Yes Yes  Type of 06/19/2018 of [place];Living will Healthcare Power of [place];Living will Healthcare Power of [place];Living will - Healthcare Power of [place];Living will Healthcare Power of [place];Living will Living will;Healthcare Power of Attorney  Does patient want to make changes to medical advance directive? No - Patient declined No - Patient declined - - - - -  Copy of Healthcare Power of Attorney in Chart? No - copy requested No - copy requested - - No - copy requested No - copy requested No - copy requested    Current Medications (verified) Outpatient Encounter Medications as of 02/15/2021  Medication Sig  . diclofenac sodium (VOLTAREN) 1 % GEL Apply topically 4 (four) times daily.  . Lidocaine HCl 4 % SOLN Apply topically.  . mupirocin ointment (BACTROBAN) 2 % Place 1 application into the nose 2 (two) times daily.  02/17/2021 omeprazole (PRILOSEC) 40 MG capsule TAKE 1 CAPSULE BY MOUTH TWICE DAILY WITHFOOD  . ondansetron (ZOFRAN) 4 MG tablet TAKE 1 TABLET BY MOUTH ONCE DAILY AS NEEDED NAUSEA AND VOMITING  . oxyCODONE-acetaminophen (PERCOCET/ROXICET) 5-325 MG tablet Take by mouth every 8 (eight) hours as needed.   . polyethylene glycol (MIRALAX / GLYCOLAX) packet Take by mouth.  Marland Kitchen tiZANidine (ZANAFLEX) 2 MG tablet tizanidine 2 mg tablet  . traZODone (DESYREL) 50 MG tablet Take 2 tablets (100 mg total) by mouth at bedtime.   No  facility-administered encounter medications on file as of 02/15/2021.    Allergies (verified) Butrans [buprenorphine] and Fosamax [alendronate sodium]   History: Past Medical History:  Diagnosis Date  . Anemia    H/O   . Arthritis   . Broken femur (HCC)    left  . Chondrodermatitis nodularis helicis    right [facility]  . Complication of anesthesia    PT STATES SHE FORGETS TO BREATHE WHEN COMING OUT OF ANESTHESIA  . GERD (gastroesophageal reflux disease)   . Hip pain    left  . Insomnia   . Osteopenia   . Osteoporosis   . Thyroid disease    Past Surgical History:  Procedure Laterality Date  . ANKLE SURGERY Left   . ESOPHAGOGASTRODUODENOSCOPY (EGD) WITH PROPOFOL N/A 11/12/2018   Procedure: ESOPHAGOGASTRODUODENOSCOPY (EGD) WITH PROPOFOL;  Surgeon: 01/11/2019, MD;  Location: [facility] ENDOSCOPY;  Service: Gastroenterology;  Laterality: N/A;  . ESOPHAGOGASTRODUODENOSCOPY (EGD) WITH PROPOFOL N/A 12/17/2018   Procedure: ESOPHAGOGASTRODUODENOSCOPY (EGD) WITH PROPOFOL with Dilation;  Surgeon: 12/19/2018, MD;  Location: [facility] ENDOSCOPY;  Service: Gastroenterology;  Laterality: N/A;  . ESOPHAGOGASTRODUODENOSCOPY (EGD) WITH PROPOFOL N/A 07/15/2019   Procedure: ESOPHAGOGASTRODUODENOSCOPY (EGD) WITH PROPOFOL with Dilation;  Surgeon: 09/14/2019, MD;  Location: [facility] ENDOSCOPY;  Service: Gastroenterology;  Laterality: N/A;  Per [facility], include steroid injection post-dilation  . ESOPHAGOGASTRODUODENOSCOPY (EGD) WITH PROPOFOL N/A 08/05/2019   Procedure: ESOPHAGOGASTRODUODENOSCOPY (EGD) WITH PROPOFOL with Dilation;  Surgeon: 10/05/2019, MD;  Location: [facility] ENDOSCOPY;  Service: Gastroenterology;  Laterality: N/A;  Per  Dr. Tobi BastosAnna, keep Kenalog 10mg /ml for possible injection  . ESOPHAGOGASTRODUODENOSCOPY (EGD) WITH PROPOFOL N/A 08/27/2019   Procedure: ESOPHAGOGASTRODUODENOSCOPY (EGD) WITH PROPOFOL with Dilation;  Surgeon: Wyline MoodAnna, Kiran, MD;  Location: [facility] ENDOSCOPY;  Service: Gastroenterology;  Laterality: N/A;   . FOOT SURGERY Left    x3  . HARDWARE REMOVAL Left 12/21/2015   Procedure: HARDWARE REMOVAL;  Surgeon: Juanell FairlyKevin Krasinski, MD;  Location: [facility] ORS;  Service: Orthopedics;  Laterality: Left;  Left HIP   . HIP SURGERY Left    x2  . KNEE SURGERY Right    x2  . LEG SURGERY Left    x2  . TOTAL HIP ARTHROPLASTY Right    x4   Family History  Problem Relation Age of Onset  . Alzheimer's disease Mother   . Cancer Father        lung never smoker   Social History   Socioeconomic History  . Marital status: Significant Other    Spouse name: Not on file  . Number of children: Not on file  . Years of education: Not on file  . Highest education level: Associate degree: academic program  Occupational History  . Not on file  Tobacco Use  . Smoking status: Former Smoker    Packs/day: 1.00    Years: 10.00    Pack years: 10.00    Types: Cigarettes    Quit date: 12/15/1995    Years since quitting: 25.1  . Smokeless tobacco: Never Used  Vaping Use  . Vaping Use: Never used  Substance and Sexual Activity  . Alcohol use: Yes    Alcohol/week: 8.0 standard drinks    Types: 8 Glasses of wine per week    Comment: 2 GLASSES EVERY EVENING  . Drug use: No  . Sexual activity: Not on file  Other Topics Concern  . Not on file  Social History Narrative   Lives with Nicole Butler    [place], 1 [place]   Prev. Enjoyed training horses raised show horses, prev enjoyed water skiing/diving until her 5150s when developed ortho issues   Social Determinants of Health   Financial Resource Strain: Low Risk   . Difficulty of Paying Living Expenses: Not hard at all  Food Insecurity: No Food Insecurity  . Worried About Programme researcher, broadcasting/film/videounning Out of Food in the Last Year: Never true  . Ran Out of Food in the Last Year: Never true  Transportation Needs: No Transportation Needs  . Lack of Transportation (Medical): No  . Lack of Transportation (Non-Medical): No  Physical Activity: Not on file  Stress:  No Stress Concern Present  . Feeling of Stress : Not at all  Social Connections: Moderately Isolated  . Frequency of Communication with Friends and Family: More than three times a week  . Frequency of Social Gatherings with Friends and Family: More than three times a week  . Attends Religious Services: Never  . Active Member of Clubs or Organizations: No  . Attends BankerClub or Organization Meetings: Never  . Marital Status: Living with partner    Tobacco Counseling Counseling given: Not Answered   Clinical Intake:  Pre-visit preparation completed: Yes        Diabetes: No  How often do you need to have someone help you when you read instructions, pamphlets, or other written materials from your doctor or pharmacy?: 1 - Never   Interpreter Needed?: No      Activities of Daily Living In your present state of health, do you  have any difficulty performing the following activities: 02/15/2021  Hearing? N  Vision? N  Difficulty concentrating or making decisions? N  Walking or climbing stairs? Y  Dressing or bathing? N  Doing errands, shopping? N  Preparing Food and eating ? Y  Using the Toilet? N  In the past six months, have you accidently leaked urine? N  Do you have problems with loss of bowel control? N  Managing your Medications? Y  Managing your Finances? Y  Housekeeping or managing your Housekeeping? Y  Some recent data might be hidden    Patient Care Team: McLean-Scocuzza, Pasty Spillers, MD as PCP - General (Internal Medicine) Gabriel Cirri, NP as PCP - Family Medicine (Nurse Practitioner) Buena Irish, MD (Student) Juanell Fairly, MD as Referring Physician (Orthopedic Surgery) Vanna Scotland, MD as Consulting Physician (Urology)  Indicate any recent Medical Services you may have received from other than [facility] providers in the past year (date may be approximate).     Assessment:   This is a routine wellness examination for Nicole Butler.  I connected with patient  02/15/21 at 12:30 PM EDT today by office visit and verified that I am speaking with the correct person using two identifiers. Information received from patient and partner, HIPAA compliant.   Some vital signs may be absent or patient reported per patient preference.    Patient is alert and oriented x3. Patient denies difficulty focusing, concentrating, making decisons.  Patient sleeps quite a bit due to the amount of pain she is generally in. Followed by pain management clinic.   Dental Screening: Recommended annual dental exams for proper oral hygiene. Wears dentures. Visits as needed.    Hearing: Demonstrates normal hearing during visit.  Vision: Followed by [facility].  They have seen their ophthalmologist in the last 12 months.   Safety:  Patient feels safe at home- yes Patient drives- yes, short distances Grab bars and handrails used as appropriate- yes Ambulates with an assistive device- yes, rollator walker, scooter Shower chair or bench in shower? yes Elevated toilet seat? yes  Medication: Taking as directed and without issues. Partner assists with management as needed.   Activities of Daily Living Patient denies needing assistance with: feeding themselves, getting from bed to chair, getting to the toilet, bathing/showering, dressing. Assistance with meal prep household chores.    Dietary issues and exercise activities discussed: Current Exercise Habits: Home exercise routine  Healthy diet, fair water intake. Discussed the importance of staying hydrated.  Goal to drink more water continued.   Physical activity- Patient is considering physical therapy to encourage more physical activity. Agrees to notify pcp.   Other Providers Patient Care Team: McLean-Scocuzza, Pasty Spillers, MD as PCP - General (Internal Medicine) Gabriel Cirri, NP as PCP - Family Medicine (Nurse Practitioner) Buena Irish, MD (Student) Juanell Fairly, MD as Referring Physician (Orthopedic  Surgery) Vanna Scotland, MD as Consulting Physician (Urology)  Hearing/Vision screen  Hearing Screening   125Hz  250Hz  500Hz  1000Hz  2000Hz  3000Hz  4000Hz  6000Hz  8000Hz   Right ear:           Left ear:           Comments: Patient is able to hear conversational tones without difficulty.  No issues reported.   Goals    . DIET - INCREASE WATER INTAKE     Recommend drinking at least 6-8 glasses of water a day       Depression Screen [facility] 2/9 Scores 02/15/2021 02/15/2020 12/15/2019 09/22/2018 06/17/2018 08/27/2017 04/16/2017  PHQ - 2 Score  0 0 0 3 0 3 2  PHQ- 9 Score - - - 12 - 7 10    Fall Risk Fall Risk  02/15/2021 02/15/2020 12/15/2019 06/17/2018 08/27/2017  Falls in the past year? 0 0 0 Yes Yes  Number falls in past yr: 0 - - 2 or more 2 or more  Injury with Fall? 0 - - Yes Yes  Comment - - - - possible broken ribs  Risk Factor Category  - - - High Fall Risk -  Risk for fall due to : Impaired mobility - - History of fall(s);Impaired mobility;Impaired balance/gait -  Follow up Falls evaluation completed Falls evaluation completed Falls evaluation completed Falls prevention discussed;Falls evaluation completed;Education provided -    FALL RISK PREVENTION PERTAINING TO THE HOME: Handrails in use when climbing stairs? Yes Home free of loose throw rugs in walkways, pet beds, electrical cords, etc? Yes  Adequate lighting in your home to reduce risk of falls? Yes   TIMED UP AND GO: Was the test performed? Yes .  Length of time to ambulate 10 feet: 10 sec.   Gait steady and fast with assistive device  Cognitive Function: Patient is alert and oriented x3.  Denies difficulty focusing, making decisions, memory loss.  MMSE/6CIT deferred. Normal by direct observation/communication.  MMSE - Mini Mental State Exam 02/15/2020  Not completed: Unable to complete     6CIT Screen 06/17/2018  What Year? 0 points  What month? 0 points  What time? 0 points  Count back from 20 0 points  Months in  reverse 0 points  Repeat phrase 0 points  Total Score 0    Immunizations Immunization History  Administered Date(s) Administered  . Influenza, High Dose Seasonal PF 08/27/2017, 08/17/2018, 06/29/2019  . Influenza-Unspecified 08/17/2015, 06/19/2016  . PFIZER(Purple Top)SARS-COV-2 Vaccination 01/04/2020, 01/25/2020  . Pneumococcal Conjugate-13 10/03/2014  . Pneumococcal Polysaccharide-23 08/17/2015   Covid  Booster- discontinued per patient preference.   Health Maintenance Health Maintenance  Topic Date Due  . Hepatitis C Screening  Never done  . INFLUENZA VACCINE  06/04/2021  . COLONOSCOPY (Pts 45-64yrs Insurance coverage will need to be confirmed)  05/01/2027  . DEXA SCAN  Completed  . PNA vac Low Risk Adult  Completed  . HPV VACCINES  Aged Out  . MAMMOGRAM  Discontinued  . TETANUS/TDAP  Discontinued  . COVID-19 Vaccine  Discontinued   Colorectal cancer screening: Type of screening: Colonoscopy. Completed 04/30/17. Repeat every 10 years  Mammogram status: No longer required due to discontinued previously per patient.   Lung Cancer Screening: (Low Dose CT Chest recommended if Age 1-80 years, 30 pack-year currently smoking OR have quit w/in 15years.) does not qualify.   Hepatitis C Screening: does qualify; Completed 02/15/21. Ordered per consent.   Community Resource Referral / Chronic Care Management: CRR required this visit?  No   CCM required this visit?  No      Plan:   Keep all routine maintenance appointments.   Plans to call the office and schedule follow up with pcp.   I have personally reviewed and noted the following in the patient's chart:   . Medical and social history . Use of alcohol, tobacco or illicit drugs  . Current medications and supplements . Functional ability and status . Nutritional status . Physical activity . Advanced directives . List of other physicians . Hospitalizations, surgeries, and ER visits in previous 12  months . Vitals . Screenings to include cognitive, depression, and falls . Referrals and appointments  In addition, I have reviewed and discussed with patient certain preventive protocols, quality metrics, and best practice recommendations. A written personalized care plan for preventive services as well as general preventive health recommendations were provided to patient via mychart.     Ashok Pall, LPN   [id number]   Reviewed information.  Agree with assessment and plan.   Dr Lorin Picket

## 2021-02-15 NOTE — Patient Instructions (Addendum)
Nicole Butler , Thank you for taking time to come for your Medicare Wellness Visit. I appreciate your ongoing commitment to your health goals. Please review the following plan we discussed and let me know if I can assist you in the future.   These are the goals we discussed: Goals    . DIET - INCREASE WATER INTAKE     Recommend drinking at least 6-8 glasses of water a day        This is a list of the screening recommended for you and due dates:  Health Maintenance  Topic Date Due  .  Hepatitis C: One time screening is recommended by Center for Disease Control  (CDC) for  adults born from [birth year] through 1964.   Never done  . Flu Shot  06/04/2021  . Colon Cancer Screening  05/01/2027  . DEXA scan (bone density measurement)  Completed  . Pneumonia vaccines  Completed  . HPV Vaccine  Aged Out  . Mammogram  Discontinued  . Tetanus Vaccine  Discontinued  . COVID-19 Vaccine  Discontinued   Immunizations Immunization History  Administered Date(s) Administered  . Influenza, High Dose Seasonal PF 08/27/2017, 08/17/2018, 06/29/2019  . Influenza-Unspecified 08/17/2015, 06/19/2016  . PFIZER(Purple Top)SARS-COV-2 Vaccination 01/04/2020, 01/25/2020  . Pneumococcal Conjugate-13 10/03/2014  . Pneumococcal Polysaccharide-23 08/17/2015    Advanced directives: End of life planning; Advance aging; Advanced directives discussed.  Copy of current HCPOA/Living Will requested.    Conditions/risks identified: none new  Next appointment: Follow up in one year for your annual wellness visit.  Fasting labs completed today.    Preventive Care 15 Years and Older, Female Preventive care refers to lifestyle choices and visits with your health care provider that can promote health and wellness. What does preventive care include?  A yearly physical exam. This is also called an annual well check.  Dental exams once or twice a year.  Routine eye exams. Ask your health care provider how often you should have  your eyes checked.  Personal lifestyle choices, including:  Daily care of your teeth and gums.  Regular physical activity.  Eating a healthy diet.  Avoiding tobacco and drug use.  Limiting alcohol use.  Practicing safe sex.  Taking low-dose aspirin every day.  Taking vitamin and mineral supplements as recommended by your health care provider. What happens during an annual well check? The services and screenings done by your health care provider during your annual well check will depend on your age, overall health, lifestyle risk factors, and family history of disease. Counseling  Your health care provider may ask you questions about your:  Alcohol use.  Tobacco use.  Drug use.  Emotional well-being.  Home and relationship well-being.  Sexual activity.  Eating habits.  History of falls.  Memory and ability to understand (cognition).  Work and work Astronomer.  Reproductive health. Screening  You may have the following tests or measurements:  Height, weight, and BMI.  Blood pressure.  Lipid and cholesterol levels. These may be checked every 5 years, or more frequently if you are over 58 years old.  Skin check.  Lung cancer screening. You may have this screening every year starting at age 91 if you have a 30-pack-year history of smoking and currently smoke or have quit within the past 15 years.  Fecal occult blood test (FOBT) of the stool. You may have this test every year starting at age 12.  Flexible sigmoidoscopy or colonoscopy. You may have a sigmoidoscopy every 5 years or  a colonoscopy every 10 years starting at age 67.  Hepatitis C blood test.  Hepatitis B blood test.  Sexually transmitted disease (STD) testing.  Diabetes screening. This is done by checking your blood sugar (glucose) after you have not eaten for a while (fasting). You may have this done every 1-3 years.  Bone density scan. This is done to screen for osteoporosis. You may have  this done starting at age 69.  Mammogram. This may be done every 1-2 years. Talk to your health care provider about how often you should have regular mammograms. Talk with your health care provider about your test results, treatment options, and if necessary, the need for more tests. Vaccines  Your health care provider may recommend certain vaccines, such as:  Influenza vaccine. This is recommended every year.  Tetanus, diphtheria, and acellular pertussis (Tdap, Td) vaccine. You may need a Td booster every 10 years.  Zoster vaccine. You may need this after age 66.  Pneumococcal 13-valent conjugate (PCV13) vaccine. One dose is recommended after age 27.  Pneumococcal polysaccharide (PPSV23) vaccine. One dose is recommended after age 38. Talk to your health care provider about which screenings and vaccines you need and how often you need them. This information is not intended to replace advice given to you by your health care provider. Make sure you discuss any questions you have with your health care provider. Document Released: 11/17/2015 Document Revised: 07/10/2016 Document Reviewed: 08/22/2015 Elsevier Interactive Patient Education  2017 [employer].  Fall Prevention in the Home Falls can cause injuries. They can happen to people of all ages. There are many things you can do to make your home safe and to help prevent falls. What can I do on the outside of my home?  Regularly fix the edges of walkways and driveways and fix any cracks.  Remove anything that might make you trip as you walk through a door, such as a raised step or threshold.  Trim any bushes or trees on the path to your home.  Use bright outdoor lighting.  Clear any walking paths of anything that might make someone trip, such as rocks or tools.  Regularly check to see if handrails are loose or broken. Make sure that both sides of any steps have handrails.  Any raised decks and porches should have guardrails on  the edges.  Have any leaves, snow, or ice cleared regularly.  Use sand or salt on walking paths during winter.  Clean up any spills in your garage right away. This includes oil or grease spills. What can I do in the bathroom?  Use night lights.  Install grab bars by the toilet and in the tub and shower. Do not use towel bars as grab bars.  Use non-skid mats or decals in the tub or shower.  If you need to sit down in the shower, use a plastic, non-slip stool.  Keep the floor dry. Clean up any water that spills on the floor as soon as it happens.  Remove soap buildup in the tub or shower regularly.  Attach bath mats securely with double-sided non-slip rug tape.  Do not have throw rugs and other things on the floor that can make you trip. What can I do in the bedroom?  Use night lights.  Make sure that you have a light by your bed that is easy to reach.  Do not use any sheets or blankets that are too big for your bed. They should not hang down onto  the floor.  Have a firm chair that has side arms. You can use this for support while you get dressed.  Do not have throw rugs and other things on the floor that can make you trip. What can I do in the kitchen?  Clean up any spills right away.  Avoid walking on wet floors.  Keep items that you use a lot in easy-to-reach places.  If you need to reach something above you, use a strong step stool that has a grab bar.  Keep electrical cords out of the way.  Do not use floor polish or wax that makes floors slippery. If you must use wax, use non-skid floor wax.  Do not have throw rugs and other things on the floor that can make you trip. What can I do with my stairs?  Do not leave any items on the stairs.  Make sure that there are handrails on both sides of the stairs and use them. Fix handrails that are broken or loose. Make sure that handrails are as long as the stairways.  Check any carpeting to make sure that it is firmly  attached to the stairs. Fix any carpet that is loose or worn.  Avoid having throw rugs at the top or bottom of the stairs. If you do have throw rugs, attach them to the floor with carpet tape.  Make sure that you have a light switch at the top of the stairs and the bottom of the stairs. If you do not have them, ask someone to add them for you. What else can I do to help prevent falls?  Wear shoes that:  Do not have high heels.  Have rubber bottoms.  Are comfortable and fit you well.  Are closed at the toe. Do not wear sandals.  If you use a stepladder:  Make sure that it is fully opened. Do not climb a closed stepladder.  Make sure that both sides of the stepladder are locked into place.  Ask someone to hold it for you, if possible.  Clearly mark and make sure that you can see:  Any grab bars or handrails.  First and last steps.  Where the edge of each step is.  Use tools that help you move around (mobility aids) if they are needed. These include:  Canes.  Walkers.  Scooters.  Crutches.  Turn on the lights when you go into a dark area. Replace any light bulbs as soon as they burn out.  Set up your furniture so you have a clear path. Avoid moving your furniture around.  If any of your floors are uneven, fix them.  If there are any pets around you, be aware of where they are.  Review your medicines with your doctor. Some medicines can make you feel dizzy. This can increase your chance of falling. Ask your doctor what other things that you can do to help prevent falls. This information is not intended to replace advice given to you by your health care provider. Make sure you discuss any questions you have with your health care provider. Document Released: 08/17/2009 Document Revised: 03/28/2016 Document Reviewed: 11/25/2014 Elsevier Interactive Patient Education  2017 [employer].

## 2021-02-16 LAB — HEPATITIS C ANTIBODY
Hepatitis C Ab: NONREACTIVE
SIGNAL TO CUT-OFF: 0.02 (ref <1.00)

## 2021-03-07 DIAGNOSIS — B351 Tinea unguium: Secondary | ICD-10-CM | POA: Diagnosis not present

## 2021-03-07 DIAGNOSIS — M79675 Pain in left toe(s): Secondary | ICD-10-CM | POA: Diagnosis not present

## 2021-03-07 DIAGNOSIS — M79674 Pain in right toe(s): Secondary | ICD-10-CM | POA: Diagnosis not present

## 2021-04-09 DIAGNOSIS — M62838 Other muscle spasm: Secondary | ICD-10-CM | POA: Diagnosis not present

## 2021-04-09 DIAGNOSIS — G894 Chronic pain syndrome: Secondary | ICD-10-CM | POA: Diagnosis not present

## 2021-04-09 DIAGNOSIS — G4700 Insomnia, unspecified: Secondary | ICD-10-CM | POA: Diagnosis not present

## 2021-04-09 DIAGNOSIS — M8949 Other hypertrophic osteoarthropathy, multiple sites: Secondary | ICD-10-CM | POA: Diagnosis not present

## 2021-05-21 DIAGNOSIS — M5416 Radiculopathy, lumbar region: Secondary | ICD-10-CM | POA: Diagnosis not present

## 2021-05-21 DIAGNOSIS — M545 Low back pain, unspecified: Secondary | ICD-10-CM | POA: Diagnosis not present

## 2021-05-23 ENCOUNTER — Emergency Department: Payer: Medicare PPO

## 2021-05-23 ENCOUNTER — Other Ambulatory Visit: Payer: Self-pay

## 2021-05-23 ENCOUNTER — Emergency Department
Admission: EM | Admit: 2021-05-23 | Discharge: 2021-05-23 | Disposition: A | Discharge status: Home / Self Care | Payer: Medicare PPO | Attending: Emergency Medicine | Admitting: Emergency Medicine

## 2021-05-23 ENCOUNTER — Encounter: Payer: Self-pay | Admitting: Emergency Medicine

## 2021-05-23 DIAGNOSIS — X58XXXA Exposure to other specified factors, initial encounter: Secondary | ICD-10-CM | POA: Insufficient documentation

## 2021-05-23 DIAGNOSIS — S22010A Wedge compression fracture of first thoracic vertebra, initial encounter for closed fracture: Secondary | ICD-10-CM | POA: Diagnosis not present

## 2021-05-23 DIAGNOSIS — Z87891 Personal history of nicotine dependence: Secondary | ICD-10-CM | POA: Insufficient documentation

## 2021-05-23 DIAGNOSIS — M5459 Other low back pain: Secondary | ICD-10-CM | POA: Diagnosis not present

## 2021-05-23 DIAGNOSIS — Z96641 Presence of right artificial hip joint: Secondary | ICD-10-CM | POA: Diagnosis not present

## 2021-05-23 DIAGNOSIS — S22080A Wedge compression fracture of T11-T12 vertebra, initial encounter for closed fracture: Secondary | ICD-10-CM | POA: Diagnosis not present

## 2021-05-23 DIAGNOSIS — G8929 Other chronic pain: Secondary | ICD-10-CM

## 2021-05-23 DIAGNOSIS — S3992XA Unspecified injury of lower back, initial encounter: Secondary | ICD-10-CM | POA: Diagnosis present

## 2021-05-23 DIAGNOSIS — M545 Low back pain, unspecified: Secondary | ICD-10-CM | POA: Diagnosis not present

## 2021-05-23 LAB — URINALYSIS, COMPLETE (UACMP) WITH MICROSCOPIC
Bacteria, UA: NONE SEEN
Bilirubin Urine: NEGATIVE
Glucose, UA: NEGATIVE mg/dL
Hgb urine dipstick: NEGATIVE
Ketones, ur: 5 mg/dL — AB
Nitrite: NEGATIVE
Protein, ur: NEGATIVE mg/dL
Specific Gravity, Urine: 1.026 (ref 1.005–1.030)
pH: 6 (ref 5.0–8.0)

## 2021-05-23 MED ORDER — OXYCODONE HCL 5 MG PO TABS
5.0000 mg | ORAL_TABLET | Freq: Once | ORAL | Status: AC
Start: 2021-05-23 — End: 2021-05-23
  Administered 2021-05-23: 5 mg via ORAL
  Filled 2021-05-23: qty 1

## 2021-05-23 MED ORDER — LIDOCAINE 5 % EX PTCH: 1.0000 | MEDICATED_PATCH | Freq: Two times a day (BID) | CUTANEOUS | 0 refills | Status: AC

## 2021-05-23 MED ORDER — LIDOCAINE 5 % EX PTCH
1.0000 | MEDICATED_PATCH | CUTANEOUS | Status: DC
  Administered 2021-05-23: 1 via TRANSDERMAL
  Filled 2021-05-23: qty 1

## 2021-05-23 MED ORDER — LORAZEPAM 0.5 MG PO TABS
0.5000 mg | ORAL_TABLET | Freq: Once | ORAL | Status: AC
  Administered 2021-05-23: 0.5 mg via ORAL
  Filled 2021-05-23: qty 1

## 2021-05-23 MED ORDER — PREDNISONE 20 MG PO TABS
ORAL_TABLET | ORAL | 0 refills | Status: DC
Start: 2021-05-23 — End: 2023-06-19

## 2021-05-23 NOTE — ED Notes (Signed)
See triage note   Presents with lower back pain and hip pain  States pain started 2 weeks ago

## 2021-05-23 NOTE — ED Provider Notes (Signed)
[facility] Emergency Department Provider Note  ____________________________________________   Event Date/Time   First MD Initiated Contact with Patient 05/23/21 [phone]     (approximate)  I have reviewed the triage vital signs and the nursing notes.   HISTORY  Chief Complaint Back Pain   HPI Nicole Butler is a 75 y.o. female presents to the ED with complaint of low back pain without new injury.  Patient has been seen at [facility] by Dr. Odis Luster recently and was told to follow-up with her pain management doctor at [facility].  Per his notes he has sent information to the pain management clinic suggesting an MRI and continued injections to control her back pain.  Patient states that she is unable to get an MRI until September and therefore came to the emergency department today.  She denies any new injury but states that the pain is unbearable today.  She denies any new complications or complaints of incontinence or bowel or bladder.  Currently she rates her pain as 10/10.         Past Medical History:  Diagnosis Date   Anemia    H/O    Arthritis    Broken femur (HCC)    left   Chondrodermatitis nodularis helicis    right [facility]   Complication of anesthesia    PT STATES SHE FORGETS TO BREATHE WHEN COMING OUT OF ANESTHESIA   GERD (gastroesophageal reflux disease)    Hip pain    left   Insomnia    Osteopenia    Osteoporosis    Thyroid disease     Patient Active Problem List   Diagnosis Date Noted   Chronic constipation 12/15/2019   Esophageal stricture    Pain medication agreement signed 10/16/2017   Urinary retention 08/27/2017   Intertrochanteric fracture (HCC) 08/29/2015   Insomnia 08/29/2015   Dysthymic disorder 08/29/2015   Anxiety and depression 08/29/2015   Chronic pain syndrome 08/29/2015   Vitamin D deficiency 08/29/2015   Macrocytosis 08/29/2015   Nausea 08/29/2015   Fracture of both hips (HCC) 04/05/2015   Lumbar compression  fracture (HCC) 04/05/2015   Osteoarthritis 08/25/2013   Osteoporosis 08/25/2013   Encounter for long-term use of opiate analgesic 08/25/2013   Hip pain, bilateral 08/25/2013   Knee pain, bilateral 08/25/2013   Scoliosis 08/25/2013    Past Surgical History:  Procedure Laterality Date   ANKLE SURGERY Left    ESOPHAGOGASTRODUODENOSCOPY (EGD) WITH PROPOFOL N/A 11/12/2018   Procedure: ESOPHAGOGASTRODUODENOSCOPY (EGD) WITH PROPOFOL;  Surgeon: [name], MD;  Location[facility] ENDOSCOPY;  Service: Gastroenterology;  Laterality: N/A;   ESOPHAGOGASTRODUODENOSCOPY (EGD) WITH PROPOFOL N/A 12/17/2018   Procedure: ESOPHAGOGASTRODUODENOSCOPY (EGD) WITH PROPOFOL with Dilation;  Surgeon: [name], MD;  Location: [facility] ENDOSCOPY;  Service: Gastroenterology;  Laterality: N/A;   ESOPHAGOGASTRODUODENOSCOPY (EGD) WITH PROPOFOL N/A 07/15/2019   Procedure: ESOPHAGOGASTRODUODENOSCOPY (EGD) WITH PROPOFOL with Dilation;  Surgeon: [name], MD;  Location: [facility] ENDOSCOPY;  Service: Gastroenterology;  Laterality: N/A;  Per Dr. Tobi Bastos, include steroid injection post-dilation   ESOPHAGOGASTRODUODENOSCOPY (EGD) WITH PROPOFOL N/A 08/05/2019   Procedure: ESOPHAGOGASTRODUODENOSCOPY (EGD) WITH PROPOFOL with Dilation;  Surgeon: [name], MD;  Location: [facility] ENDOSCOPY;  Service: Gastroenterology;  Laterality: N/A;  Per Dr. Tobi Bastos, keep Kenalog 10mg /ml for possible injection   ESOPHAGOGASTRODUODENOSCOPY (EGD) WITH PROPOFOL N/A 08/27/2019   Procedure: ESOPHAGOGASTRODUODENOSCOPY (EGD) WITH PROPOFOL with Dilation;  Surgeon: 08/29/2019, MD;  Location: [facility] ENDOSCOPY;  Service: Gastroenterology;  Laterality: N/A;   FOOT SURGERY Left  x3   HARDWARE REMOVAL Left 12/21/2015   Procedure: HARDWARE REMOVAL;  Surgeon: Juanell FairlyKevin Krasinski, MD;  Location: [facility] ORS;  Service: Orthopedics;  Laterality: Left;  Left HIP    HIP SURGERY Left    x2   KNEE SURGERY Right    x2   LEG SURGERY Left    x2   TOTAL HIP ARTHROPLASTY Right    x4     Prior to Admission medications   Medication Sig Start Date End Date Taking? Authorizing Provider  lidocaine (LIDODERM) 5 % Place 1 patch onto the skin every 12 (twelve) hours. Remove & Discard patch within 12 hours or as directed by MD 05/23/21 05/23/22 Yes Levada SchillingSummers, Kallie Lockshonda L, PA-C  predniSONE (DELTASONE) 20 MG tablet Take 2 tablets once a day for 5 days 05/23/21  Yes Bridget HartshornSummers, Celso Granja L, PA-C  diclofenac sodium (VOLTAREN) 1 % GEL Apply topically 4 (four) times daily.    [provider]  Lidocaine HCl 4 % SOLN Apply topically.    [provider]  mupirocin ointment (BACTROBAN) 2 % Place 1 application into the nose 2 (two) times daily. 08/27/17   Gabriel CirriWicker, Cheryl, NP  omeprazole (PRILOSEC) 40 MG capsule TAKE 1 CAPSULE BY MOUTH TWICE DAILY WITHFOOD 12/19/20   McLean-Scocuzza, Pasty Spillersracy N, MD  ondansetron (ZOFRAN) 4 MG tablet TAKE 1 TABLET BY MOUTH ONCE DAILY AS NEEDED NAUSEA AND VOMITING 12/19/20   McLean-Scocuzza, Pasty Spillersracy N, MD  oxyCODONE-acetaminophen (PERCOCET/ROXICET) 5-325 MG tablet Take by mouth every 8 (eight) hours as needed.  01/09/17   [provider]  polyethylene glycol (MIRALAX / GLYCOLAX) packet Take by mouth.    [provider]  tiZANidine (ZANAFLEX) 2 MG tablet tizanidine 2 mg tablet 06/12/18   [provider]  traZODone (DESYREL) 50 MG tablet Take 2 tablets (100 mg total) by mouth at bedtime. 12/15/19   McLean-Scocuzza, Pasty Spillersracy N, MD    Allergies Butrans [buprenorphine] and Fosamax [alendronate sodium]  Family History  Problem Relation Age of Onset   Alzheimer's disease Mother    Cancer Father        lung never smoker    Social History Social History   Tobacco Use   Smoking status: Former    Packs/day: 1.00    Years: 10.00    Pack years: 10.00    Types: Cigarettes    Quit date: 12/15/1995    Years since quitting: 25.4   Smokeless tobacco: Never  Vaping Use   Vaping Use: Never used  Substance Use Topics   Alcohol use: Yes     Alcohol/week: 8.0 standard drinks    Types: 8 Glasses of wine per week    Comment: 2 GLASSES EVERY EVENING   Drug use: No    Review of Systems  Constitutional: No fever/chills Eyes: No visual changes. Cardiovascular: Denies chest pain. Respiratory: Denies shortness of breath. Gastrointestinal: No abdominal pain.  No nausea, no vomiting.  No diarrhea.  No constipation. Genitourinary: Negative for dysuria. Musculoskeletal: Positive for acute and chronic back pain. Skin: Negative for rash. Neurological: Negative for headaches, focal weakness or numbness.  ____________________________________________   PHYSICAL EXAM:  VITAL SIGNS: ED Triage Vitals  Enc Vitals Group     BP 05/23/21 0936 118/81     Pulse Rate 05/23/21 0936 80     Resp 05/23/21 0936 16     Temp --      Temp src --      SpO2 05/23/21 0936 95 %     Weight 05/23/21 0921 87 lb  15.4 oz (39.9 kg)     Height 05/23/21 0921 5\' 7"  (1.702 m)     Head Circumference --      Peak Flow --      Pain Score --      Pain Loc --      Pain Edu? --      Excl. in GC? --     Constitutional: Alert and oriented. Well appearing and in no acute distress. Eyes: Conjunctivae are normal. PERRL. EOMI. Head: Atraumatic. Neck: No stridor.   Cardiovascular: Normal rate, regular rhythm. Grossly normal heart sounds.  Good peripheral circulation. Respiratory: Normal respiratory effort.  No retractions. Lungs CTAB. Gastrointestinal: Soft and nontender. No distention.  Musculoskeletal: On examination of the back there is obvious scoliosis present.  Marked tenderness on palpation of the lower thoracic and entire lumbar spine.  Paravertebral muscles are markedly tender with the left more tender than the right.  Patient is able to move upper and lower extremities.  She is able to tolerate straight leg raises to about 20 degrees bilaterally.  Good muscle strength bilaterally at 5/5. Neurologic:  Normal speech and language.  Reflexes were 2+  bilaterally.  No gross focal neurologic deficits are appreciated.  Gait was not tested due to patient's pain. Skin:  Skin is warm, dry and intact. No rash noted. Psychiatric: Mood and affect are normal. Speech and behavior are normal.  ____________________________________________   LABS (all labs ordered are listed, but only abnormal results are displayed)  Labs Reviewed  URINALYSIS, COMPLETE (UACMP) WITH MICROSCOPIC - Abnormal; Notable for the following components:      Result Value   Color, Urine AMBER (*)    APPearance HAZY (*)    Ketones, ur 5 (*)    Leukocytes,Ua TRACE (*)    All other components within normal limits   ____________________________________________  RADIOLOGY I, , personally viewed and evaluated these images (plain radiographs) as part of my medical decision making, as well as reviewing the written report by the radiologist.   Official radiology report(s): MR LUMBAR SPINE WO CONTRAST  Result Date: 05/23/2021 CLINICAL DATA:  Low back pain, > 6 wks History severe osteoporosis EXAM: MRI LUMBAR SPINE WITHOUT CONTRAST TECHNIQUE: Multiplanar, multisequence MR imaging of the lumbar spine was performed. No intravenous contrast was administered. COMPARISON:  None. FINDINGS: Segmentation: Presumed standard anatomy with the inferior-most well developed disc space designated as L5-S1. Alignment:  Mild scoliotic lumbar curvature.  No static listhesis. Vertebrae: Mild superior endplate compression fracture of the L1 vertebral body with approximately 20% height loss. Mild marrow edema within the L1 vertebral body suggests an acute to subacute process. There is a small amount of fluid present within the fracture cleft. Mild retropulsion of the superior endplate by approximately 4 mm resulting in mild canal stenosis at the T12-L1 level. Chronic appearing mild wedge deformity of the L4 vertebral body without residual marrow edema. No evidence of discitis. Diffuse marrow  heterogeneity without discrete marrow replacing lesion. Findings are nonspecific but can be seen in the setting of chronic anemia, smoking, and/or obesity. There are a few scattered intraosseous hemangiomas, notably within the T11 vertebral body. Conus medullaris and cauda equina: Conus extends to the L1-2 level. Conus and cauda equina appear normal. Paraspinal and other soft tissues: Cholelithiasis. Disc levels: T12-L1: L1 superior endplate bony retropulsion with mild bilateral facet arthropathy and ligamentum flavum buckling contribute to mild canal stenosis. No significant foraminal stenosis. L1-L2: Disc osteophyte complex, eccentric to the left. Mild bilateral facet  arthropathy and ligamentum flavum buckling. Findings contribute to mild canal stenosis with left greater than right subarticular recess stenosis. Mild-moderate left and mild right foraminal stenosis. L2-L3: Mild circumferential disc bulge. Moderate bilateral facet arthropathy. Ligamentum flavum buckling. Findings contribute to mild canal stenosis with mild-moderate left foraminal stenosis. L3-L4: Circumferential disc osteophyte complex. Moderate bilateral facet arthropathy and ligamentum flavum buckling. Findings contribute to moderate canal stenosis with right greater than left subarticular recess stenosis. Moderate-severe right and mild left foraminal stenosis. L4-L5: Mild circumferential disc bulge. Bilateral facet arthropathy. Mild right greater than left subarticular recess stenosis without canal stenosis. Moderate-severe right foraminal stenosis. L5-S1: Disc osteophyte complex with biforaminal protrusions and spurring. Bilateral facet arthropathy. Moderate right and mild left subarticular recess stenosis without canal stenosis. Moderate to severe bilateral foraminal stenosis. IMPRESSION: 1. Acute-to-subacute mild superior endplate compression fracture of the L1 vertebral body with approximately 20% height loss. Mild retropulsion of the superior  endplate resulting in mild canal stenosis at the T12-L1 level. 2. Advanced multilevel lumbar spondylosis with moderate canal stenosis at L3-L4 and mild canal stenosis at L1-L2 and L2-L3. 3. Multilevel foraminal stenosis, moderate-severe on the right at L3-L4 and L4-L5 and bilaterally at L5-S1. 4. Cholelithiasis. Electronically Signed   By: Duanne Guess D.O.   On: 05/23/2021 12:43    ____________________________________________   PROCEDURES  Procedure(s) performed (including Critical Care):  Procedures   ____________________________________________   INITIAL IMPRESSION / ASSESSMENT AND PLAN / ED COURSE  As part of my medical decision making, I reviewed the following data within the electronic MEDICAL RECORD NUMBER Notes from prior ED visits and [place] Controlled Substance Database  75 year old female presents to the ED with acute exacerbation of her chronic back pain.  Patient has been seen at [facility] pain clinic and more recently at [facility] in [place].  It was recommended that she return to [facility] pain clinic for injections and an MRI.  Patient states that she is unable to get an MRI before September and that the pain is unbearable today.  She denies any incontinence of bowel or bladder and states that this is her normal pain just uncontrolled with her present medication of Zanaflex and Percocet.  MRI was performed and there is a new compression fracture at L1 with with a 20% height loss.  The remaining findings were chronic in nature.  The compression fracture found today was explained to both patient and husband.  They discussed possibly changing from the pain clinic at [facility] to [facility] regional.  Patient was given a Lidoderm patch to apply to her back while in the emergency department and she received a oxycodone immediate release 5 mg and Ativan 0.5 mg p.o. prior to her MRI.  She is to continue with her regular medication and also a prescription for Lidoderm patches was sent to the pharmacy along  with a prescription for prednisone 40 mg daily for the next 5 days.  She is return to the emergency department if any severe worsening of her symptoms especially incontinence of bowel or bladder otherwise she will follow-up with her regular doctors.  ____________________________________________   FINAL CLINICAL IMPRESSION(S) / ED DIAGNOSES  Final diagnoses:  Compression fracture of T1 vertebra, initial encounter (HCC)  Chronic bilateral low back pain with left-sided sciatica     ED Discharge Orders          Ordered    predniSONE (DELTASONE) 20 MG tablet        05/23/21 1321    lidocaine (LIDODERM) 5 %  Every  12 hours        05/23/21 1322             Note:  This document was prepared using Dragon voice recognition software and may include unintentional dictation errors.    Tommi Rumps, PA-C 05/23/21 1459    Sharman Cheek, MD 05/24/21 [phone]

## 2021-05-23 NOTE — ED Triage Notes (Signed)
C/O left lower back and hip pain x 2 weeks  Seen through [facility] who referred patient to ED for injections and a MRI.

## 2021-05-23 NOTE — Discharge Instructions (Addendum)
Follow-up with your doctor at [facility] for your compression fracture of T1.  Continue with your regular medications of the oxycodone-acetaminophen and the Zanaflex as already prescribed.  A prescription for Lidoderm patches and prednisone was sent to your pharmacy.  You may also use ice or heat to your back as needed for discomfort.  Return to the emergency department if any severe worsening of your symptoms such as loss of bowel or bladder function or control.

## 2021-05-23 NOTE — Progress Notes (Signed)
   05/23/21 0920  Clinical Encounter Type  Visited With Patient  Visit Type Initial;Spiritual support;Social support  Referral From Other (Comment) (rounding)  Spiritual Encounters  Spiritual Needs Emotional (general support)  Chaplain Burris met Ms. Presas in the ED waiting area. Chaplain checked-in on her well-being and offered compassionate presence, reflective listening, and hospitality as she waited. Ms. Kronberg shared some of her story. Daryel November offered support and prayer.

## 2021-05-28 ENCOUNTER — Telehealth: Payer: Self-pay | Admitting: Gastroenterology

## 2021-05-28 NOTE — Telephone Encounter (Signed)
Patient wants to discuss getting Endoscopy and would like to speak with the CMA. Clinical staff will follow up with patient.

## 2021-05-28 NOTE — Telephone Encounter (Signed)
Dr. Tobi Bastos, pt called stating that she is having dysphagia again and would like to have an EGD. On your last note you had stated that she should call us if she had issues. Please advise.  Dr. Johnney Killian response:  Office visit or video visit first.  Video visit scheduled for 05/30/2021 at 2:00 PM as a virtual visit.

## 2021-05-29 ENCOUNTER — Other Ambulatory Visit: Payer: Self-pay

## 2021-05-30 ENCOUNTER — Telehealth (INDEPENDENT_AMBULATORY_CARE_PROVIDER_SITE_OTHER): Payer: Medicare PPO | Admitting: Gastroenterology

## 2021-05-30 ENCOUNTER — Other Ambulatory Visit: Payer: Self-pay

## 2021-05-30 DIAGNOSIS — K222 Esophageal obstruction: Secondary | ICD-10-CM | POA: Diagnosis not present

## 2021-05-30 DIAGNOSIS — R1310 Dysphagia, unspecified: Secondary | ICD-10-CM | POA: Diagnosis not present

## 2021-05-30 NOTE — Progress Notes (Signed)
Nicole Butler , MD [address]  [address]  [place], [place] [address]  Main: [phone]  Fax: [phone]   Primary Care Physician: McLean-Scocuzza, Pasty Spillers, MD  Virtual Visit via Video Note  I connected with patient on 05/30/21 at  2:00 PM EDT by video and verified that I am speaking with the correct person using two identifiers.   I discussed the limitations, risks, security and privacy concerns of performing an evaluation and management service by video  and the availability of in person appointments. I also discussed with the patient that there may be a patient responsible charge related to this service. The patient expressed understanding and agreed to proceed.  Location of Patient: Home Location of Provider: Home Persons involved: Patient and provider only   History of Present Illness:   Chief complaint dysphagia   HPI: Nicole Butler is a 75 y.o. female   Summary of history :  She was initially referred and seen on 10/29/18 for dysphagia ongoing for a year and opoid induced constipation    11/10/2018 : Normal TSH,Hb 13 grams   11/12/2018 : EGD: One benign-appearing, intrinsic moderate (stenosis was found at the gastroesophageal junction. This stenosis measured less than one cm.The stenosis was traversed. A TTS dilator was passed through the scope. Dilation with a 08-15-11 mm balloon dilator was performed to 11 mm. TBx  of esophagus showed active chronic inflammation   12/17/2018: EGD: GE junction stricture dilated to 15 mm.  Biopsies of the esophagus showed no eosinophilia  08/05/2019:EGD: GE junction stricture dilated from 8mm to 15 mm and injection of steroid into the stricture.  08/27/2019: EGD: GE junction stricture stretched to 18 mm- had bleeding and was sprayed with hemospray - barium swallow showed no perforation   Interval history 01/06/2020-05/30/2021  Over the past few months gradual onset of dysphagia gradually worsening.  Some odynophagia.  She has  been taking Advil and Celebrex on a daily basis for back pains.  Taking Prilosec 40 mg twice a day.   Current Outpatient Medications  Medication Sig Dispense Refill   diclofenac sodium (VOLTAREN) 1 % GEL Apply topically 4 (four) times daily.     lidocaine (LIDODERM) 5 % Place 1 patch onto the skin every 12 (twelve) hours. Remove & Discard patch within 12 hours or as directed by MD 10 patch 0   Lidocaine HCl 4 % SOLN Apply topically.     mupirocin ointment (BACTROBAN) 2 % Place 1 application into the nose 2 (two) times daily. 22 g 0   omeprazole (PRILOSEC) 40 MG capsule TAKE 1 CAPSULE BY MOUTH TWICE DAILY WITHFOOD 180 capsule 1   ondansetron (ZOFRAN) 4 MG tablet TAKE 1 TABLET BY MOUTH ONCE DAILY AS NEEDED NAUSEA AND VOMITING 20 tablet 5   oxyCODONE-acetaminophen (PERCOCET/ROXICET) 5-325 MG tablet Take by mouth every 8 (eight) hours as needed.      pneumococcal 23 valent vaccine (PNEUMOVAX 23) 25 MCG/0.5ML injection Take 1 mL by mouth 1 day or 1 dose.     polyethylene glycol (MIRALAX / GLYCOLAX) packet Take by mouth.     predniSONE (DELTASONE) 20 MG tablet Take 2 tablets once a day for 5 days 10 tablet 0   tiZANidine (ZANAFLEX) 2 MG tablet tizanidine 2 mg tablet     traZODone (DESYREL) 50 MG tablet Take 2 tablets (100 mg total) by mouth at bedtime. 180 tablet 3   No current facility-administered medications for this visit.    Allergies as of 05/30/2021 -  Review Complete 05/23/2021  Allergen Reaction Noted   Butrans [buprenorphine] Hives 06/11/2014   Fosamax [alendronate sodium] Other (See Comments) 08/29/2015    Review of Systems:    All systems reviewed and negative except where noted in HPI.  General Appearance:    Alert, cooperative, no distress, appears stated age  Head:    Normocephalic, without obvious abnormality, atraumatic  Eyes:    PERRL, conjunctiva/corneas clear,  Ears:    Grossly normal hearing    Neurologic:  Grossly normal    Observations/Objective:  Labs: CMP      Component Value Date/Time   NA 134 (L) 02/15/2021 1306   NA 128 (L) 04/24/2017 1117   NA 131 (L) 09/05/2014 0419   K 4.3 02/15/2021 1306   K 4.6 09/05/2014 0419   CL 97 02/15/2021 1306   CL 99 09/05/2014 0419   CO2 29 02/15/2021 1306   CO2 25 09/05/2014 0419   GLUCOSE 84 02/15/2021 1306   GLUCOSE 120 (H) 09/05/2014 0419   BUN 8 02/15/2021 1306   BUN 7 (L) 04/24/2017 1117   BUN 6 (L) 09/05/2014 0419   CREATININE 0.55 02/15/2021 1306   CREATININE 0.47 (L) 09/05/2014 0419   CALCIUM 9.4 02/15/2021 1306   CALCIUM 7.9 (L) 09/05/2014 0419   PROT 6.7 02/15/2021 1306   PROT 6.7 04/16/2017 1029   PROT 6.7 09/03/2014 2237   ALBUMIN 4.1 02/15/2021 1306   ALBUMIN 4.3 04/16/2017 1029   ALBUMIN 3.7 09/03/2014 2237   AST 18 02/15/2021 1306   AST 29 09/03/2014 2237   ALT 10 02/15/2021 1306   ALT 19 09/03/2014 2237   ALKPHOS 45 02/15/2021 1306   ALKPHOS 77 09/03/2014 2237   BILITOT 0.5 02/15/2021 1306   BILITOT 0.4 04/16/2017 1029   BILITOT 0.2 09/03/2014 2237   GFRNONAA 102 04/24/2017 1117   GFRNONAA >60 09/05/2014 0419   GFRAA 117 04/24/2017 1117   GFRAA >60 09/05/2014 0419   Lab Results  Component Value Date   WBC 8.4 02/15/2021   HGB 12.8 02/15/2021   HCT 38.8 02/15/2021   MCV 93.4 02/15/2021   PLT 296.0 02/15/2021    Imaging Studies: MR LUMBAR SPINE WO CONTRAST  Result Date: 05/23/2021 CLINICAL DATA:  Low back pain, > 6 wks History severe osteoporosis EXAM: MRI LUMBAR SPINE WITHOUT CONTRAST TECHNIQUE: Multiplanar, multisequence MR imaging of the lumbar spine was performed. No intravenous contrast was administered. COMPARISON:  None. FINDINGS: Segmentation: Presumed standard anatomy with the inferior-most well developed disc space designated as L5-S1. Alignment:  Mild scoliotic lumbar curvature.  No static listhesis. Vertebrae: Mild superior endplate compression fracture of the L1 vertebral body with approximately 20% height loss. Mild marrow edema within the L1 vertebral body  suggests an acute to subacute process. There is a small amount of fluid present within the fracture cleft. Mild retropulsion of the superior endplate by approximately 4 mm resulting in mild canal stenosis at the T12-L1 level. Chronic appearing mild wedge deformity of the L4 vertebral body without residual marrow edema. No evidence of discitis. Diffuse marrow heterogeneity without discrete marrow replacing lesion. Findings are nonspecific but can be seen in the setting of chronic anemia, smoking, and/or obesity. There are a few scattered intraosseous hemangiomas, notably within the T11 vertebral body. Conus medullaris and cauda equina: Conus extends to the L1-2 level. Conus and cauda equina appear normal. Paraspinal and other soft tissues: Cholelithiasis. Disc levels: T12-L1: L1 superior endplate bony retropulsion with mild bilateral facet arthropathy and ligamentum flavum buckling contribute to  mild canal stenosis. No significant foraminal stenosis. L1-L2: Disc osteophyte complex, eccentric to the left. Mild bilateral facet arthropathy and ligamentum flavum buckling. Findings contribute to mild canal stenosis with left greater than right subarticular recess stenosis. Mild-moderate left and mild right foraminal stenosis. L2-L3: Mild circumferential disc bulge. Moderate bilateral facet arthropathy. Ligamentum flavum buckling. Findings contribute to mild canal stenosis with mild-moderate left foraminal stenosis. L3-L4: Circumferential disc osteophyte complex. Moderate bilateral facet arthropathy and ligamentum flavum buckling. Findings contribute to moderate canal stenosis with right greater than left subarticular recess stenosis. Moderate-severe right and mild left foraminal stenosis. L4-L5: Mild circumferential disc bulge. Bilateral facet arthropathy. Mild right greater than left subarticular recess stenosis without canal stenosis. Moderate-severe right foraminal stenosis. L5-S1: Disc osteophyte complex with  biforaminal protrusions and spurring. Bilateral facet arthropathy. Moderate right and mild left subarticular recess stenosis without canal stenosis. Moderate to severe bilateral foraminal stenosis. IMPRESSION: 1. Acute-to-subacute mild superior endplate compression fracture of the L1 vertebral body with approximately 20% height loss. Mild retropulsion of the superior endplate resulting in mild canal stenosis at the T12-L1 level. 2. Advanced multilevel lumbar spondylosis with moderate canal stenosis at L3-L4 and mild canal stenosis at L1-L2 and L2-L3. 3. Multilevel foraminal stenosis, moderate-severe on the right at L3-L4 and L4-L5 and bilaterally at L5-S1. 4. Cholelithiasis. Electronically Signed   By: Duanne Guess D.O.   On: 05/23/2021 12:43    Assessment and Plan:   Nicole Butler is a 75 y.o. y/o female with a prior history of esophageal stricture which has been stretched a few times last in October 2020.  Also injected with triamcinolone in the past.  Today returns with further symptoms of dysphagia.  Plan 1.  Avoid all NSAIDs 2.  Continue on Prilosec 40 mg twice daily 3.  EGD with dilation 4.  Strongly advised her to stop all Advil and Celebrex which she has been taking for at least 2 weeks prior to dilation.  Follow-up as needed I have discussed alternative options, risks & benefits,  which include, but are not limited to, bleeding, infection, perforation,respiratory complication & drug reaction.  The patient agrees with this plan & written consent will be obtained.       I discussed the assessment and treatment plan with the patient. The patient was provided an opportunity to ask questions and all were answered. The patient agreed with the plan and demonstrated an understanding of the instructions.   The patient was advised to call back or seek an in-person evaluation if the symptoms worsen or if the condition fails to improve as anticipated.  I provided 20 minutes of face-to-face time  during this encounter.  Dr Nicole Mood MD,MRCP [facility]) Gastroenterology/Hepatology Pager: [phone]   Speech recognition software was used to dictate this note.

## 2021-05-30 NOTE — Addendum Note (Signed)
Addended by: Adela Ports on: 05/30/2021 04:03 PM   Modules accepted: Orders

## 2021-06-01 DIAGNOSIS — M62838 Other muscle spasm: Secondary | ICD-10-CM | POA: Diagnosis not present

## 2021-06-01 DIAGNOSIS — S32010D Wedge compression fracture of first lumbar vertebra, subsequent encounter for fracture with routine healing: Secondary | ICD-10-CM | POA: Diagnosis not present

## 2021-06-05 DIAGNOSIS — S32010D Wedge compression fracture of first lumbar vertebra, subsequent encounter for fracture with routine healing: Secondary | ICD-10-CM | POA: Diagnosis not present

## 2021-06-13 DIAGNOSIS — M79674 Pain in right toe(s): Secondary | ICD-10-CM | POA: Diagnosis not present

## 2021-06-13 DIAGNOSIS — B351 Tinea unguium: Secondary | ICD-10-CM | POA: Diagnosis not present

## 2021-06-13 DIAGNOSIS — M79675 Pain in left toe(s): Secondary | ICD-10-CM | POA: Diagnosis not present

## 2021-06-20 ENCOUNTER — Encounter: Payer: Self-pay | Admitting: Gastroenterology

## 2021-06-21 ENCOUNTER — Ambulatory Visit: Payer: Medicare PPO | Admitting: Registered Nurse

## 2021-06-21 ENCOUNTER — Encounter: Payer: Self-pay | Admitting: Gastroenterology

## 2021-06-21 ENCOUNTER — Encounter
Admission: RE | Disposition: A | Discharge status: Home / Self Care | Payer: Self-pay | Source: Home / Self Care | Attending: Gastroenterology

## 2021-06-21 ENCOUNTER — Ambulatory Visit
Admission: RE | Admit: 2021-06-21 | Discharge: 2021-06-21 | Disposition: A | Discharge status: Home / Self Care | Payer: Medicare PPO | Attending: Gastroenterology | Admitting: Gastroenterology

## 2021-06-21 DIAGNOSIS — Z96641 Presence of right artificial hip joint: Secondary | ICD-10-CM | POA: Diagnosis not present

## 2021-06-21 DIAGNOSIS — K222 Esophageal obstruction: Secondary | ICD-10-CM | POA: Diagnosis not present

## 2021-06-21 DIAGNOSIS — R1310 Dysphagia, unspecified: Secondary | ICD-10-CM | POA: Diagnosis not present

## 2021-06-21 DIAGNOSIS — Z888 Allergy status to other drugs, medicaments and biological substances status: Secondary | ICD-10-CM | POA: Diagnosis not present

## 2021-06-21 DIAGNOSIS — Z79899 Other long term (current) drug therapy: Secondary | ICD-10-CM | POA: Insufficient documentation

## 2021-06-21 DIAGNOSIS — Z87891 Personal history of nicotine dependence: Secondary | ICD-10-CM | POA: Diagnosis not present

## 2021-06-21 DIAGNOSIS — K219 Gastro-esophageal reflux disease without esophagitis: Secondary | ICD-10-CM | POA: Diagnosis not present

## 2021-06-21 HISTORY — PX: ESOPHAGOGASTRODUODENOSCOPY (EGD) WITH PROPOFOL: SHX5813

## 2021-06-21 SURGERY — ESOPHAGOGASTRODUODENOSCOPY (EGD) WITH PROPOFOL
Anesthesia: General

## 2021-06-21 MED ORDER — PROPOFOL 500 MG/50ML IV EMUL
INTRAVENOUS | Status: DC | PRN
  Administered 2021-06-21: 150 ug/kg/min via INTRAVENOUS

## 2021-06-21 MED ORDER — PROPOFOL 500 MG/50ML IV EMUL
INTRAVENOUS | Status: AC
  Filled 2021-06-21: qty 50

## 2021-06-21 MED ORDER — SODIUM CHLORIDE 0.9 % IV SOLN
INTRAVENOUS | Status: DC
  Administered 2021-06-21: 1000 mL via INTRAVENOUS

## 2021-06-21 MED ORDER — PROPOFOL 10 MG/ML IV BOLUS
INTRAVENOUS | Status: DC | PRN
  Administered 2021-06-21: 60 mg via INTRAVENOUS

## 2021-06-21 MED ORDER — LIDOCAINE HCL (PF) 2 % IJ SOLN
INTRAMUSCULAR | Status: AC
  Filled 2021-06-21: qty 5

## 2021-06-21 MED ORDER — LIDOCAINE HCL (CARDIAC) PF 100 MG/5ML IV SOSY
PREFILLED_SYRINGE | INTRAVENOUS | Status: DC | PRN
  Administered 2021-06-21: 40 mg via INTRAVENOUS

## 2021-06-21 MED ORDER — GLYCOPYRROLATE 0.2 MG/ML IJ SOLN
INTRAMUSCULAR | Status: AC
  Filled 2021-06-21: qty 1

## 2021-06-21 MED ORDER — GLYCOPYRROLATE 0.2 MG/ML IJ SOLN
INTRAMUSCULAR | Status: DC | PRN
  Administered 2021-06-21: .2 mg via INTRAVENOUS

## 2021-06-21 NOTE — Anesthesia Procedure Notes (Signed)
Date/Time: 06/21/2021 10:39 AM Performed by: Stormy Fabian, CRNA Pre-anesthesia Checklist: Patient identified, Emergency Drugs available, Suction available and Patient being monitored Patient Re-evaluated:Patient Re-evaluated prior to induction Oxygen Delivery Method: Nasal cannula Induction Type: IV induction Dental Injury: Teeth and Oropharynx as per pre-operative assessment  Comments: Nasal cannula with etCO2 monitoring

## 2021-06-21 NOTE — Anesthesia Postprocedure Evaluation (Signed)
Anesthesia Post Note  Patient: IVETH HEIDEMANN  Procedure(s) Performed: ESOPHAGOGASTRODUODENOSCOPY (EGD) WITH PROPOFOL  Patient location during evaluation: Phase II Anesthesia Type: General Level of consciousness: awake and alert, awake and oriented Pain management: pain level controlled Vital Signs Assessment: post-procedure vital signs reviewed and stable Respiratory status: spontaneous breathing, nonlabored ventilation and respiratory function stable Cardiovascular status: blood pressure returned to baseline and stable Postop Assessment: no apparent nausea or vomiting Anesthetic complications: no   No notable events documented.   Last Vitals:  Vitals:   06/21/21 1109 06/21/21 1119  BP: (!) 146/81 (!) 148/90  Pulse: 72 72  Resp: (!) 22 17  Temp:    SpO2: 96% 99%    Last Pain:  Vitals:   06/21/21 1119  TempSrc:   PainSc: 9                  Manfred Arch

## 2021-06-21 NOTE — Op Note (Signed)
[facility] Gastroenterology Patient Name: Nicole Butler Procedure Date: 06/21/2021 10:27 AM MRN: [id number] Account #: [id number] Date of Birth: December 05, 1945 Admit Type: Outpatient Age: 75 Room: [facility] ENDO ROOM 3 Gender: Female Note Status: Finalized Procedure:             Upper GI endoscopy Indications:           Dysphagia Providers:             [name] MD, MD Referring MD:          Pasty Spillers Mclean-Scocuzza MD, MD (Referring MD) Medicines:             Monitored Anesthesia Care Complications:         No immediate complications. Procedure:             Pre-Anesthesia Assessment:                        - Prior to the procedure, a History and Physical was                         performed, and patient medications, allergies and                         sensitivities were reviewed. The patient's tolerance                         of previous anesthesia was reviewed.                        - The risks and benefits of the procedure and the                         sedation options and risks were discussed with the                         patient. All questions were answered and informed                         consent was obtained.                        - ASA Grade Assessment: III - A patient with severe                         systemic disease.                        After obtaining informed consent, the endoscope was                         passed under direct vision. Throughout the procedure,                         the patient's blood pressure, pulse, and oxygen                         saturations were monitored continuously. The Endoscope                         was introduced through the mouth, and  advanced to the                         third part of duodenum. The upper GI endoscopy was                         accomplished with ease. The patient tolerated the                         procedure well. Findings:      One benign-appearing, intrinsic moderate  (circumferential scarring or       stenosis; an endoscope may pass) stenosis was found at the       gastroesophageal junction. This stenosis measured 1.4 cm (inner       diameter) x less than one cm (in length). The stenosis was traversed. A       TTS dilator was passed through the scope. Dilation with a 15-16.5-18 mm       balloon dilator was performed to 16.5 mm. The dilation site was examined       and showed moderate mucosal disruption.      The stomach was normal.      The examined duodenum was normal.      The cardia and gastric fundus were normal on retroflexion. Impression:            - Benign-appearing esophageal stenosis. Dilated.                        - Normal stomach.                        - Normal examined duodenum.                        - No specimens collected. Recommendation:        - Discharge patient to home (with escort).                        - Resume previous diet.                        - Continue present medications.                        - Return to my office PRN. Procedure Code(s):     --- Professional ---                        [phone], Esophagogastroduodenoscopy, flexible,                         transoral; with transendoscopic balloon dilation of                         esophagus (less than 30 mm diameter) Diagnosis Code(s):     --- Professional ---                        K22.2, Esophageal obstruction                        R13.10, Dysphagia, unspecified CPT copyright 2019 American Medical Association. All rights reserved. The codes documented in  this report are preliminary and upon coder review may  be revised to meet current compliance requirements. [name], MD [name] MD, MD 06/21/2021 10:47:16 AM This report has been signed electronically. Number of Addenda: 0 Note Initiated On: 06/21/2021 10:27 AM Estimated Blood Loss:  Estimated blood loss: none.      [facility]

## 2021-06-21 NOTE — Anesthesia Preprocedure Evaluation (Signed)
Anesthesia Evaluation  Patient identified by MRN, date of birth, ID band Patient awake    Reviewed: Allergy & Precautions, H&P , NPO status , Patient's Chart, lab work & pertinent test results  History of Anesthesia Complications Negative for: history of anesthetic complications  Airway Mallampati: III  TM Distance: >3 FB     Dental  (+) Upper Dentures, Lower Dentures   Pulmonary neg pulmonary ROS, neg COPD, former smoker,    Pulmonary exam normal        Cardiovascular hypertension, (-) angina(-) Past MI and (-) Cardiac Stents Normal cardiovascular exam(-) dysrhythmias      Neuro/Psych PSYCHIATRIC DISORDERS Anxiety Depression negative neurological ROS     GI/Hepatic Neg liver ROS, GERD  Controlled,  Endo/Other  negative endocrine ROS  Renal/GU negative Renal ROS  negative genitourinary   Musculoskeletal  (+) Arthritis ,   Abdominal   Peds  Hematology  (+) Blood dyscrasia, anemia ,   Anesthesia Other Findings Anemia  H/O   Arthritis    Broken femur (HCC)  left  Chondrodermatitis nodularis helicis  right [facility] derm  Complication of anesthesia  PT STATES SHE FORGETS TO BREATHE WHEN COMING OUT OF ANESTHESIA GERD (gastroesophageal reflux disease) Hip pain  left  Insomnia    Osteopenia    Osteoporosis    Thyroid disease       Reproductive/Obstetrics negative OB ROS                             Anesthesia Physical  Anesthesia Plan  ASA: 3  Anesthesia Plan: General   Post-op Pain Management:    Induction: Intravenous  PONV Risk Score and Plan: Propofol infusion and TIVA  Airway Management Planned: Natural Airway and Nasal Cannula  Additional Equipment:   Intra-op Plan:   Post-operative Plan:   Informed Consent: I have reviewed the patients History and Physical, chart, labs and discussed the procedure including the risks, benefits and alternatives for the proposed anesthesia  with the patient or authorized representative who has indicated his/her understanding and acceptance.     Dental Advisory Given  Plan Discussed with: Anesthesiologist, CRNA and Surgeon  Anesthesia Plan Comments: (Patient consented for risks of anesthesia including but not limited to:  - adverse reactions to medications - risk of intubation if required - damage to teeth, lips or other oral mucosa - sore throat or hoarseness - Damage to heart, brain, lungs or loss of life  Patient voiced understanding.)        Anesthesia Quick Evaluation

## 2021-06-21 NOTE — Transfer of Care (Signed)
Immediate Anesthesia Transfer of Care Note  Patient: Nicole Butler  Procedure(s) Performed: Procedure(s): ESOPHAGOGASTRODUODENOSCOPY (EGD) WITH PROPOFOL (N/A)  Patient Location: PACU and Endoscopy Unit  Anesthesia Type:General  Level of Consciousness: sedated  Airway & Oxygen Therapy: Patient Spontanous Breathing and Patient connected to nasal cannula oxygen  Post-op Assessment: Report given to RN and Post -op Vital signs reviewed and stable  Post vital signs: Reviewed and stable  Last Vitals:  Vitals:   06/21/21 1010 06/21/21 1049  BP: 121/86 (!) 147/73  Pulse: (!) 56 63  Resp: 16 11  Temp: 36.7 C (!) 35.7 C  SpO2: 97% 97%    Complications: No apparent anesthesia complications

## 2021-06-21 NOTE — H&P (Signed)
[name], MD [address], [address], [place], [place], [address] [address], [address], [place], [place], [address] Phone: [phone]  Fax: [phone]  Primary Care Physician:  McLean-Scocuzza, Pasty Spillers, MD   Pre-Procedure History & Physical: HPI:  Nicole Butler is a 75 y.o. female is here for an endoscopy    Past Medical History:  Diagnosis Date   Anemia    H/O    Arthritis    Broken femur (HCC)    left   Chondrodermatitis nodularis helicis    right [facility]   Complication of anesthesia    PT STATES SHE FORGETS TO BREATHE WHEN COMING OUT OF ANESTHESIA   GERD (gastroesophageal reflux disease)    Hip pain    left   Insomnia    Osteopenia    Osteoporosis    Thyroid disease     Past Surgical History:  Procedure Laterality Date   ANKLE SURGERY Left    ESOPHAGOGASTRODUODENOSCOPY (EGD) WITH PROPOFOL N/A 11/12/2018   Procedure: ESOPHAGOGASTRODUODENOSCOPY (EGD) WITH PROPOFOL;  Surgeon: [name], MD;  Location: [facility] ENDOSCOPY;  Service: Gastroenterology;  Laterality: N/A;   ESOPHAGOGASTRODUODENOSCOPY (EGD) WITH PROPOFOL N/A 12/17/2018   Procedure: ESOPHAGOGASTRODUODENOSCOPY (EGD) WITH PROPOFOL with Dilation;  Surgeon: [name], MD;  Location: [facility] ENDOSCOPY;  Service: Gastroenterology;  Laterality: N/A;   ESOPHAGOGASTRODUODENOSCOPY (EGD) WITH PROPOFOL N/A 07/15/2019   Procedure: ESOPHAGOGASTRODUODENOSCOPY (EGD) WITH PROPOFOL with Dilation;  Surgeon: [name], MD;  Location: [facility] ENDOSCOPY;  Service: Gastroenterology;  Laterality: N/A;  Per Dr. Tobi Bastos, include steroid injection post-dilation   ESOPHAGOGASTRODUODENOSCOPY (EGD) WITH PROPOFOL N/A 08/05/2019   Procedure: ESOPHAGOGASTRODUODENOSCOPY (EGD) WITH PROPOFOL with Dilation;  Surgeon: [name], MD;  Location: [facility] ENDOSCOPY;  Service: Gastroenterology;  Laterality: N/A;  Per Dr. Tobi Bastos, keep Kenalog 10mg /ml for possible injection   ESOPHAGOGASTRODUODENOSCOPY (EGD) WITH PROPOFOL N/A 08/27/2019   Procedure:  ESOPHAGOGASTRODUODENOSCOPY (EGD) WITH PROPOFOL with Dilation;  Surgeon: 08/29/2019, MD;  Location: [facility] ENDOSCOPY;  Service: Gastroenterology;  Laterality: N/A;   FOOT SURGERY Left    x3   FRACTURE SURGERY     HARDWARE REMOVAL Left 12/21/2015   Procedure: HARDWARE REMOVAL;  Surgeon: 12/23/2015, MD;  Location: [facility] ORS;  Service: Orthopedics;  Laterality: Left;  Left HIP    HIP SURGERY Left    x2   KNEE SURGERY Right    x2   LEG SURGERY Left    x2   TOTAL HIP ARTHROPLASTY Right    x4    Prior to Admission medications   Medication Sig Start Date End Date Taking? Authorizing Provider  diclofenac sodium (VOLTAREN) 1 % GEL Apply topically 4 (four) times daily.   Yes [provider]  lidocaine (LIDODERM) 5 % Place 1 patch onto the skin every 12 (twelve) hours. Remove & Discard patch within 12 hours or as directed by MD 05/23/21 05/23/22 Yes 05/25/22, Rhonda L, PA-C  Lidocaine HCl 4 % SOLN Apply topically.   Yes [provider]  mupirocin ointment (BACTROBAN) 2 % Place 1 application into the nose 2 (two) times daily. 08/27/17  Yes 08/29/17, NP  omeprazole (PRILOSEC) 40 MG capsule TAKE 1 CAPSULE BY MOUTH TWICE DAILY WITHFOOD 12/19/20  Yes McLean-Scocuzza, 12/21/20, MD  oxyCODONE-acetaminophen (PERCOCET/ROXICET) 5-325 MG tablet Take by mouth every 8 (eight) hours as needed.  01/09/17  Yes [provider]  polyethylene glycol (MIRALAX / GLYCOLAX) packet Take by mouth.   Yes [provider]  traZODone (DESYREL) 50 MG tablet Take 2 tablets (  100 mg total) by mouth at bedtime. 12/15/19  Yes McLean-Scocuzza, Pasty Spillers, MD  ondansetron (ZOFRAN) 4 MG tablet TAKE 1 TABLET BY MOUTH ONCE DAILY AS NEEDED NAUSEA AND VOMITING 12/19/20   McLean-Scocuzza, Pasty Spillers, MD  pneumococcal 23 valent vaccine (PNEUMOVAX 23) 25 MCG/0.5ML injection Take 1 mL by mouth 1 day or 1 dose.    [provider]  predniSONE (DELTASONE) 20 MG tablet Take 2 tablets once a day for 5 days  05/23/21   Tommi Rumps, PA-C  tiZANidine (ZANAFLEX) 2 MG tablet tizanidine 2 mg tablet Patient not taking: Reported on 06/21/2021 06/12/18   [provider]    Allergies as of 05/31/2021 - Review Complete 05/23/2021  Allergen Reaction Noted   Butrans [buprenorphine] Hives 06/11/2014   Fosamax [alendronate sodium] Other (See Comments) 08/29/2015    Family History  Problem Relation Age of Onset   Alzheimer's disease Mother    Cancer Father        lung never smoker    Social History   Socioeconomic History   Marital status: Significant Other    Spouse name: Not on file   Number of children: Not on file   Years of education: Not on file   Highest education level: Associate degree: academic program  Occupational History   Not on file  Tobacco Use   Smoking status: Former    Packs/day: 1.00    Years: 10.00    Pack years: 10.00    Types: Cigarettes    Quit date: 12/15/1995    Years since quitting: 25.5   Smokeless tobacco: Never  Vaping Use   Vaping Use: Never used  Substance and Sexual Activity   Alcohol use: Yes    Alcohol/week: 14.0 standard drinks    Types: 14 Glasses of wine per week    Comment: 2 GLASSES EVERY EVENING   Drug use: No   Sexual activity: Not on file  Other Topics Concern   Not on file  Social History Narrative   Lives with [name]    1 daughter [place], 1 son in [place]. Enjoyed training horses raised show horses, prev enjoyed water skiing/diving until her 73s when developed ortho issues   Social Determinants of Corporate investment banker Strain: Low Risk    Difficulty of Paying Living Expenses: Not hard at all  Food Insecurity: No Food Insecurity   Worried About Programme researcher, broadcasting/film/video in the Last Year: Never true   Barista in the Last Year: Never true  Transportation Needs: No Transportation Needs   Lack of Transportation (Medical): No   Lack of Transportation (Non-Medical): No  Physical Activity: Not on  file  Stress: No Stress Concern Present   Feeling of Stress : Not at all  Social Connections: Moderately Isolated   Frequency of Communication with Friends and Family: More than three times a week   Frequency of Social Gatherings with Friends and Family: More than three times a week   Attends Religious Services: Never   Database administrator or Organizations: No   Attends Engineer, structural: Never   Marital Status: Living with partner  Intimate Partner Violence: Not At Risk   Fear of Current or Ex-Partner: No   Emotionally Abused: No   Physically Abused: No   Sexually Abused: No    Review of Systems: See HPI, otherwise negative ROS  Physical Exam: BP 121/86   Pulse (!) 56   Temp  98.1 F (36.7 C) (Temporal)   Resp 16   Ht 5\' 7"  (1.702 m)   Wt 41.4 kg   SpO2 97%   BMI 14.28 kg/m  General:   Alert,  pleasant and cooperative in NAD Head:  Normocephalic and atraumatic. Neck:  Supple; no masses or thyromegaly. Lungs:  Clear throughout to auscultation, normal respiratory effort.    Heart:  +S1, +S2, Regular rate and rhythm, No edema. Abdomen:  Soft, nontender and nondistended. Normal bowel sounds, without guarding, and without rebound.   Neurologic:  Alert and  oriented x4;  grossly normal neurologically.  Impression/Plan: Nicole Butler is here for an endoscopy  to be performed for  evaluation of dysphagia    Risks, benefits, limitations, and alternatives regarding endoscopy have been reviewed with the patient.  Questions have been answered.  All parties agreeable.   Selmer Dominion, MD  06/21/2021, 10:32 AM dysphagia

## 2021-06-22 ENCOUNTER — Encounter: Payer: Self-pay | Admitting: Gastroenterology

## 2021-06-25 DIAGNOSIS — M419 Scoliosis, unspecified: Secondary | ICD-10-CM | POA: Diagnosis not present

## 2021-06-25 DIAGNOSIS — M461 Sacroiliitis, not elsewhere classified: Secondary | ICD-10-CM | POA: Diagnosis not present

## 2021-06-25 DIAGNOSIS — S32010D Wedge compression fracture of first lumbar vertebra, subsequent encounter for fracture with routine healing: Secondary | ICD-10-CM | POA: Diagnosis not present

## 2021-06-25 DIAGNOSIS — M4856XA Collapsed vertebra, not elsewhere classified, lumbar region, initial encounter for fracture: Secondary | ICD-10-CM | POA: Diagnosis not present

## 2021-06-25 DIAGNOSIS — G894 Chronic pain syndrome: Secondary | ICD-10-CM | POA: Diagnosis not present

## 2021-06-25 DIAGNOSIS — M810 Age-related osteoporosis without current pathological fracture: Secondary | ICD-10-CM | POA: Diagnosis not present

## 2021-06-25 DIAGNOSIS — M8008XA Age-related osteoporosis with current pathological fracture, vertebra(e), initial encounter for fracture: Secondary | ICD-10-CM | POA: Diagnosis not present

## 2021-06-29 ENCOUNTER — Telehealth: Payer: Self-pay | Admitting: Internal Medicine

## 2021-06-29 NOTE — Telephone Encounter (Signed)
Called [facility] to speak with sam. Call was answered and I was placed on hold for 5 minutes. I hung up the call due to time. Pt needs an appointment before they can receive further refills. Last in office appointment was on 12/15/19.

## 2021-06-29 NOTE — Telephone Encounter (Signed)
Sam from [facility] is calling to check on the status of a request for omeprazole that they sent over for the patient on 7/28.8/19 and 8/26.Please advise and call them at [phone].

## 2021-07-03 NOTE — Telephone Encounter (Signed)
Called to speak with Nicole Butler to schedule for an appointment. Call was answered and then disconnected without an introduction. Could not leave a message to call back.

## 2021-07-04 NOTE — Telephone Encounter (Signed)
Called to speak with Nicole Butler. Nicole Butler states that she has been receiving the medicine from Dr. French Ana and that she did not need a visit to receive it before. Nicole Butler states that she has had multiple telephone calls with . Pt states that she had an endoscopy with Dr. Tobi Bastos and she is being treated by her. Pt declined scheduling an appointment with another doctor as she has not been seen within a years time. Pt states that she will contact Dr. Tobi Bastos for her medication.

## 2021-07-10 DIAGNOSIS — F1190 Opioid use, unspecified, uncomplicated: Secondary | ICD-10-CM | POA: Diagnosis not present

## 2021-07-10 DIAGNOSIS — S32010D Wedge compression fracture of first lumbar vertebra, subsequent encounter for fracture with routine healing: Secondary | ICD-10-CM | POA: Diagnosis not present

## 2021-07-10 DIAGNOSIS — G4700 Insomnia, unspecified: Secondary | ICD-10-CM | POA: Diagnosis not present

## 2021-07-10 DIAGNOSIS — G894 Chronic pain syndrome: Secondary | ICD-10-CM | POA: Diagnosis not present

## 2021-07-17 DIAGNOSIS — M4856XS Collapsed vertebra, not elsewhere classified, lumbar region, sequela of fracture: Secondary | ICD-10-CM | POA: Diagnosis not present

## 2021-07-17 DIAGNOSIS — M545 Low back pain, unspecified: Secondary | ICD-10-CM | POA: Diagnosis not present

## 2021-07-20 DIAGNOSIS — M47816 Spondylosis without myelopathy or radiculopathy, lumbar region: Secondary | ICD-10-CM | POA: Diagnosis not present

## 2021-07-20 DIAGNOSIS — M4856XS Collapsed vertebra, not elsewhere classified, lumbar region, sequela of fracture: Secondary | ICD-10-CM | POA: Diagnosis not present

## 2021-08-08 ENCOUNTER — Telehealth: Payer: Self-pay | Admitting: Gastroenterology

## 2021-08-08 DIAGNOSIS — K219 Gastro-esophageal reflux disease without esophagitis: Secondary | ICD-10-CM

## 2021-08-08 MED ORDER — OMEPRAZOLE 40 MG PO CPDR: DELAYED_RELEASE_CAPSULE | ORAL | 3 refills | Status: DC

## 2021-08-08 NOTE — Telephone Encounter (Signed)
Prescription was sent to patient's pharmacy ([facility]).

## 2021-08-08 NOTE — Telephone Encounter (Signed)
Medication Refill.. Omeprazole (Prilosec) 40 mg capsules. Pt said she has ran out of her refills

## 2021-08-30 ENCOUNTER — Other Ambulatory Visit: Payer: Self-pay

## 2021-08-30 ENCOUNTER — Encounter: Payer: Self-pay | Admitting: Internal Medicine

## 2021-08-30 ENCOUNTER — Ambulatory Visit: Payer: Medicare PPO | Admitting: Internal Medicine

## 2021-08-30 VITALS — BP 124/76 | HR 67 | Temp 98.2°F | Ht 67.0 in | Wt 94.2 lb

## 2021-08-30 DIAGNOSIS — T07XXXA Unspecified multiple injuries, initial encounter: Secondary | ICD-10-CM | POA: Diagnosis not present

## 2021-08-30 DIAGNOSIS — Z0000 Encounter for general adult medical examination without abnormal findings: Secondary | ICD-10-CM

## 2021-08-30 DIAGNOSIS — S32000D Wedge compression fracture of unspecified lumbar vertebra, subsequent encounter for fracture with routine healing: Secondary | ICD-10-CM

## 2021-08-30 DIAGNOSIS — Z993 Dependence on wheelchair: Secondary | ICD-10-CM

## 2021-08-30 DIAGNOSIS — M810 Age-related osteoporosis without current pathological fracture: Secondary | ICD-10-CM

## 2021-08-30 NOTE — Progress Notes (Signed)
Chief Complaint  Patient presents with   Follow-up   Annual with partner Bill roach  1.  History of 16 ortho surgeries for osteoporosis and recently 2 lumbar compression fractures s/p surgery 06/2021 taking percocet 5-325 total 4-5 pills per day  Has seen endocrine in the past Dr. Tresa Endo now at [facility] will refer locally  Pain 7/10 generalized   Review of Systems  Constitutional:  Negative for weight loss.  HENT:  Negative for hearing loss.   Eyes:  Negative for blurred vision.  Respiratory:  Negative for sputum production.   Cardiovascular:  Negative for chest pain.  Gastrointestinal:  Negative for abdominal pain.  Musculoskeletal:  Positive for back pain, joint pain and neck pain. Negative for falls.  Skin:  Negative for rash.  Neurological:  Negative for headaches.  Psychiatric/Behavioral:  Negative for depression.   Past Medical History:  Diagnosis Date   Anemia    H/O    Arthritis    Broken femur (HCC)    left   Chondrodermatitis nodularis helicis    right [facility]   Complication of anesthesia    PT STATES SHE FORGETS TO BREATHE WHEN COMING OUT OF ANESTHESIA   GERD (gastroesophageal reflux disease)    Hip pain    left   Insomnia    Osteopenia    Osteoporosis    Thyroid disease    Past Surgical History:  Procedure Laterality Date   ANKLE SURGERY Left    ESOPHAGOGASTRODUODENOSCOPY (EGD) WITH PROPOFOL N/A 11/12/2018   Procedure: ESOPHAGOGASTRODUODENOSCOPY (EGD) WITH PROPOFOL;  Surgeon: [name], MD;  Location: [facility] ENDOSCOPY;  Service: Gastroenterology;  Laterality: N/A;   ESOPHAGOGASTRODUODENOSCOPY (EGD) WITH PROPOFOL N/A 12/17/2018   Procedure: ESOPHAGOGASTRODUODENOSCOPY (EGD) WITH PROPOFOL with Dilation;  Surgeon: [name], MD;  Location: [facility] ENDOSCOPY;  Service: Gastroenterology;  Laterality: N/A;   ESOPHAGOGASTRODUODENOSCOPY (EGD) WITH PROPOFOL N/A 07/15/2019   Procedure: ESOPHAGOGASTRODUODENOSCOPY (EGD) WITH PROPOFOL with Dilation;  Surgeon: [name],  MD;  Location: [facility] ENDOSCOPY;  Service: Gastroenterology;  Laterality: N/A;  Per Dr. Tobi Bastos, include steroid injection post-dilation   ESOPHAGOGASTRODUODENOSCOPY (EGD) WITH PROPOFOL N/A 08/05/2019   Procedure: ESOPHAGOGASTRODUODENOSCOPY (EGD) WITH PROPOFOL with Dilation;  Surgeon: [name], MD;  Location: [facility] ENDOSCOPY;  Service: Gastroenterology;  Laterality: N/A;  Per Dr. Tobi Bastos, keep Kenalog 10mg /ml for possible injection   ESOPHAGOGASTRODUODENOSCOPY (EGD) WITH PROPOFOL N/A 08/27/2019   Procedure: ESOPHAGOGASTRODUODENOSCOPY (EGD) WITH PROPOFOL with Dilation;  Surgeon: 08/29/2019, MD;  Location: [facility] ENDOSCOPY;  Service: Gastroenterology;  Laterality: N/A;   ESOPHAGOGASTRODUODENOSCOPY (EGD) WITH PROPOFOL N/A 06/21/2021   Procedure: ESOPHAGOGASTRODUODENOSCOPY (EGD) WITH PROPOFOL;  Surgeon: 06/23/2021, MD;  Location: [facility] ENDOSCOPY;  Service: Gastroenterology;  Laterality: N/A;   FOOT SURGERY Left    x3   FRACTURE SURGERY     HARDWARE REMOVAL Left 12/21/2015   Procedure: HARDWARE REMOVAL;  Surgeon: 12/23/2015, MD;  Location: [facility] ORS;  Service: Orthopedics;  Laterality: Left;  Left HIP    HIP SURGERY Left    x2   KNEE SURGERY Right    x2   LEG SURGERY Left    x2   TOTAL HIP ARTHROPLASTY Right    x4   Family History  Problem Relation Age of Onset   Alzheimer's disease Mother    Cancer Father        lung never smoker   Social History   Socioeconomic History   Marital status: Significant Other    Spouse name: Not on file   Number of children: Not on file  Years of education: Not on file   Highest education level: Associate degree: academic program  Occupational History   Not on file  Tobacco Use   Smoking status: Former    Packs/day: 1.00    Years: 10.00    Pack years: 10.00    Types: Cigarettes    Quit date: 12/15/1995    Years since quitting: 25.7   Smokeless tobacco: Never  Vaping Use   Vaping Use: Never used  Substance and Sexual Activity   Alcohol use: Yes     Alcohol/week: 14.0 standard drinks    Types: 14 Glasses of wine per week    Comment: 2 GLASSES EVERY EVENING   Drug use: No   Sexual activity: Not on file  Other Topics Concern   Not on file  Social History Narrative   Lives with [name]    1 daughter [place] , 1 son in [place]. Enjoyed training horses raised show horses, prev enjoyed water skiing/diving until her 19s when developed ortho issues   Social Determinants of Corporate investment banker Strain: Low Risk    Difficulty of Paying Living Expenses: Not hard at all  Food Insecurity: No Food Insecurity   Worried About Programme researcher, broadcasting/film/video in the Last Year: Never true   Barista in the Last Year: Never true  Transportation Needs: No Transportation Needs   Lack of Transportation (Medical): No   Lack of Transportation (Non-Medical): No  Physical Activity: Not on file  Stress: No Stress Concern Present   Feeling of Stress : Not at all  Social Connections: Moderately Isolated   Frequency of Communication with Friends and Family: More than three times a week   Frequency of Social Gatherings with Friends and Family: More than three times a week   Attends Religious Services: Never   Database administrator or Organizations: No   Attends Engineer, structural: Never   Marital Status: Living with partner  Intimate Partner Violence: Not At Risk   Fear of Current or Ex-Partner: No   Emotionally Abused: No   Physically Abused: No   Sexually Abused: No   Current Meds  Medication Sig   celecoxib (CELEBREX) 100 MG capsule Take 100 mg by mouth daily.   diclofenac sodium (VOLTAREN) 1 % GEL Apply topically 4 (four) times daily.   lidocaine (LIDODERM) 5 % Place 1 patch onto the skin every 12 (twelve) hours. Remove & Discard patch within 12 hours or as directed by MD   Lidocaine HCl 4 % SOLN Apply topically.   omeprazole (PRILOSEC) 40 MG capsule TAKE 1 CAPSULE BY MOUTH TWICE DAILY WITHFOOD   ondansetron  (ZOFRAN) 4 MG tablet TAKE 1 TABLET BY MOUTH ONCE DAILY AS NEEDED NAUSEA AND VOMITING   oxyCODONE-acetaminophen (PERCOCET/ROXICET) 5-325 MG tablet Take by mouth every 8 (eight) hours as needed.    polyethylene glycol (MIRALAX / GLYCOLAX) packet Take by mouth.   predniSONE (DELTASONE) 20 MG tablet Take 2 tablets once a day for 5 days   tiZANidine (ZANAFLEX) 2 MG tablet    traZODone (DESYREL) 50 MG tablet Take 2 tablets (100 mg total) by mouth at bedtime.   Allergies  Allergen Reactions   Butrans [Buprenorphine] Hives   Fosamax [Alendronate Sodium] Other (See Comments)    dysphagia   No results found for this or any previous visit (from the past 2160 hour(s)). Objective  Body mass index is 14.75 kg/m. Wt Readings from Last 3 Encounters:  08/30/21 94 lb 3.2 oz (42.7 kg)  06/21/21 91 lb 2.9 oz (41.4 kg)  05/23/21 87 lb 15.4 oz (39.9 kg)   Temp Readings from Last 3 Encounters:  08/30/21 98.2 F (36.8 C) (Oral)  06/21/21 (!) 96.2 F (35.7 C) (Tympanic)  05/23/21 98 F (36.7 C) (Oral)   BP Readings from Last 3 Encounters:  08/30/21 124/76  06/21/21 (!) 148/90  05/23/21 135/81   Pulse Readings from Last 3 Encounters:  08/30/21 67  06/21/21 72  05/23/21 68    Physical Exam Vitals and nursing note reviewed.  Constitutional:      Appearance: Normal appearance. She is well-developed and well-groomed.  HENT:     Head: Normocephalic and atraumatic.  Eyes:     Conjunctiva/sclera: Conjunctivae normal.     Pupils: Pupils are equal, round, and reactive to light.  Cardiovascular:     Rate and Rhythm: Normal rate and regular rhythm.     Heart sounds: Normal heart sounds. No murmur heard. Skin:    General: Skin is warm and dry.  Neurological:     General: No focal deficit present.     Mental Status: She is alert and oriented to person, place, and time. Mental status is at baseline.     Gait: Gait normal.     Comments: In wheelchair today   Psychiatric:        Attention and  Perception: Attention and perception normal.        Mood and Affect: Mood and affect normal.        Speech: Speech normal.        Behavior: Behavior normal. Behavior is cooperative.        Thought Content: Thought content normal.        Cognition and Memory: Cognition and memory normal.        Judgment: Judgment normal.    Assessment  Plan  Annual physical exam Labs 02/2021 Flu shot utd  prevnar utd  pna 23 08/17/15  covid 19 vx 2/2 Shingrix given rx, Tdap consider in future  Screen hep C negative   EGD last EGD 08/27/19 esophageal stenosis dilated, multiple EGD 2020 Dr. Tobi Bastos Colonoscopy 04/30/17 polyp no path found declines for now consider cologuard   Wt 132 progressively declined stable at 88 lbs>94 due to reduced po. Monitor   mammo declines further no FH breast cancer   DEXA 08/16/15 h/o osteoporosis saw osteopenia h/o reclast x 2 in the past Dr. Tresa Endo last 01/14/18 reclast infusion x 2declines further due to h/o esophageal stenosis and dilated 08/27/19 Dr. Tobi Bastos   Skin dry skin disc cetaphil/cerave cream and hydration with water   Out of age window pap 01/01/00 epithelial cell abnormal atypical cells; declines further declines vaginal bleeding as of 12/15/19  Skin seen [facility] in the past no acute issues  Rec healthy diet and exercise   [facility]   Osteoporosis, unspecified osteoporosis type, with multiple fractures- Plan: DG Bone Density, Ambulatory referral to Endocrinology Dr. Turner Daniels Compression fracture of lumbar vertebra with routine healing, unspecified lumbar vertebral level, subsequent encounter - Plan: DG Bone Density, Ambulatory referral to Endocrinology Reclast infusions x 2 but h/o esophageal stenosis   Multiple fractures - Plan: DG Bone Density, Ambulatory referral to Endocrinology  Able to mobilize using indoor motorized wheelchair   Provider: Dr. French Ana McLean-Scocuzza-Internal Medicine

## 2021-08-30 NOTE — Patient Instructions (Addendum)
Pain clinic discuss longer acting pain and breakthrough pain medication  Dr. Jerrel [facility] Endocrine  Consider bone schedule  [phone] Not available [address]   [place] [address]      Specialties     Internal Medicine, Endocrinology             Senna -S (senna laxative + colace stool softer)  Warm prune juice  Miralax + colace  Zoster Vaccine, Recombinant injection What is this medication? ZOSTER VACCINE (ZOS ter vak SEEN) is a vaccine used to reduce the risk of getting shingles. This vaccine is not used to treat shingles or nerve pain from shingles. This medicine may be used for other purposes; ask your health care provider or pharmacist if you have questions. COMMON BRAND NAME(S): [facility] What should I tell my care team before I take this medication? They need to know if you have any of these conditions: cancer immune system problems an unusual or allergic reaction to Zoster vaccine, other medications, foods, dyes, or preservatives pregnant or trying to get pregnant breast-feeding How should I use this medication? This vaccine is injected into a muscle. It is given by a health care provider. A copy of Vaccine Information Statements will be given before each vaccination. Be sure to read this information carefully each time. This sheet may change often. Talk to your health care provider about the use of this vaccine in children. This vaccine is not approved for use in children. Overdosage: If you think you have taken too much of this medicine contact a poison control center or emergency room at once. NOTE: This medicine is only for you. Do not share this medicine with others. What if I miss a dose? Keep appointments for follow-up (booster) doses. It is important not to miss your dose. Call your health care provider if you are unable to keep an appointment. What may interact with this medication? medicines that suppress your immune  system medicines to treat cancer steroid medicines like prednisone or cortisone This list may not describe all possible interactions. Give your health care provider a list of all the medicines, herbs, non-prescription drugs, or dietary supplements you use. Also tell them if you smoke, drink alcohol, or use illegal drugs. Some items may interact with your medicine. What should I watch for while using this medication? Visit your health care provider regularly. This vaccine, like all vaccines, may not fully protect everyone. What side effects may I notice from receiving this medication? Side effects that you should report to your doctor or health care professional as soon as possible: allergic reactions (skin rash, itching or hives; swelling of the face, lips, or tongue) trouble breathing Side effects that usually do not require medical attention (report these to your doctor or health care professional if they continue or are bothersome): chills headache fever nausea pain, redness, or irritation at site where injected tiredness vomiting This list may not describe all possible side effects. Call your doctor for medical advice about side effects. You may report side effects to FDA at 1-800-FDA-1088. Where should I keep my medication? This vaccine is only given by a health care provider. It will not be stored at home. NOTE: This sheet is a summary. It may not cover all possible information. If you have questions about this medicine, talk to your doctor, pharmacist, or health care provider.  2022 Elsevier/Gold Standard (2019-11-26 16:23:07)

## 2021-09-10 ENCOUNTER — Telehealth: Payer: Self-pay | Admitting: Internal Medicine

## 2021-09-10 NOTE — Telephone Encounter (Signed)
Please do bone density  She can call [facility] endocrine daily for cancellations

## 2021-09-10 NOTE — Telephone Encounter (Signed)
Patient called and said she has a bone density test this month. She can not get into see her endocrinologist until February. Should she still do her bone density test?

## 2021-09-10 NOTE — Telephone Encounter (Signed)
Please advise 

## 2021-09-10 NOTE — Telephone Encounter (Signed)
Patient informed and verbalized understanding

## 2021-09-10 NOTE — Telephone Encounter (Signed)
Patient states she was informed by the office that she was referred to that they are not taking new Patients.   Patient would like referral changed to Nicole Endo, MD with [facility]. States they would be able to see her in February. Okay to change referral?

## 2021-09-17 NOTE — Addendum Note (Signed)
Addended by: Quentin Ore on: 09/17/2021 10:42 AM   Modules accepted: Orders

## 2021-09-24 DIAGNOSIS — H2513 Age-related nuclear cataract, bilateral: Secondary | ICD-10-CM | POA: Diagnosis not present

## 2021-09-26 ENCOUNTER — Other Ambulatory Visit: Payer: Self-pay

## 2021-09-26 ENCOUNTER — Other Ambulatory Visit: Payer: Self-pay | Admitting: Internal Medicine

## 2021-09-26 ENCOUNTER — Ambulatory Visit
Admission: RE | Admit: 2021-09-26 | Discharge: 2021-09-26 | Disposition: A | Discharge status: Home / Self Care | Payer: Medicare PPO | Source: Ambulatory Visit | Attending: Internal Medicine | Admitting: Internal Medicine

## 2021-09-26 DIAGNOSIS — T07XXXA Unspecified multiple injuries, initial encounter: Secondary | ICD-10-CM | POA: Insufficient documentation

## 2021-09-26 DIAGNOSIS — M810 Age-related osteoporosis without current pathological fracture: Secondary | ICD-10-CM

## 2021-09-26 DIAGNOSIS — S32000D Wedge compression fracture of unspecified lumbar vertebra, subsequent encounter for fracture with routine healing: Secondary | ICD-10-CM | POA: Insufficient documentation

## 2021-10-04 DIAGNOSIS — M1610 Unilateral primary osteoarthritis, unspecified hip: Secondary | ICD-10-CM | POA: Diagnosis not present

## 2021-10-04 DIAGNOSIS — F1190 Opioid use, unspecified, uncomplicated: Secondary | ICD-10-CM | POA: Diagnosis not present

## 2021-10-04 DIAGNOSIS — G894 Chronic pain syndrome: Secondary | ICD-10-CM | POA: Diagnosis not present

## 2021-10-04 DIAGNOSIS — M62838 Other muscle spasm: Secondary | ICD-10-CM | POA: Diagnosis not present

## 2021-10-08 DIAGNOSIS — M79674 Pain in right toe(s): Secondary | ICD-10-CM | POA: Diagnosis not present

## 2021-10-08 DIAGNOSIS — M79675 Pain in left toe(s): Secondary | ICD-10-CM | POA: Diagnosis not present

## 2021-10-08 DIAGNOSIS — B351 Tinea unguium: Secondary | ICD-10-CM | POA: Diagnosis not present

## 2021-10-10 ENCOUNTER — Telehealth: Payer: Self-pay | Admitting: Internal Medicine

## 2021-10-10 NOTE — Telephone Encounter (Signed)
Patient called and would like to speak to Dr Shirlee Latch about the 2 active referrals.

## 2021-10-11 NOTE — Telephone Encounter (Signed)
Patient had questions regarding the scheduling of her appointment with her referral. States she was told she would have to see a NP before she saw the doctor. States she does not want two appointments as transport is hard for her.   Informed the Patient she will need to call their office back to have them explain the process to her. To also ask them if there was any way she could have only one appointment as transport is hard for her. Patient verbalized understanding and will call their office back.

## 2021-12-28 DIAGNOSIS — M5136 Other intervertebral disc degeneration, lumbar region: Secondary | ICD-10-CM | POA: Diagnosis not present

## 2021-12-28 DIAGNOSIS — Z0289 Encounter for other administrative examinations: Secondary | ICD-10-CM | POA: Diagnosis not present

## 2021-12-28 DIAGNOSIS — M545 Low back pain, unspecified: Secondary | ICD-10-CM | POA: Diagnosis not present

## 2021-12-28 DIAGNOSIS — G894 Chronic pain syndrome: Secondary | ICD-10-CM | POA: Diagnosis not present

## 2021-12-31 DIAGNOSIS — H2513 Age-related nuclear cataract, bilateral: Secondary | ICD-10-CM | POA: Diagnosis not present

## 2021-12-31 DIAGNOSIS — H04123 Dry eye syndrome of bilateral lacrimal glands: Secondary | ICD-10-CM | POA: Diagnosis not present

## 2022-01-14 DIAGNOSIS — M79675 Pain in left toe(s): Secondary | ICD-10-CM | POA: Diagnosis not present

## 2022-01-14 DIAGNOSIS — M79674 Pain in right toe(s): Secondary | ICD-10-CM | POA: Diagnosis not present

## 2022-01-14 DIAGNOSIS — B351 Tinea unguium: Secondary | ICD-10-CM | POA: Diagnosis not present

## 2022-01-22 DIAGNOSIS — K219 Gastro-esophageal reflux disease without esophagitis: Secondary | ICD-10-CM | POA: Diagnosis not present

## 2022-01-22 DIAGNOSIS — H52222 Regular astigmatism, left eye: Secondary | ICD-10-CM | POA: Diagnosis not present

## 2022-01-22 DIAGNOSIS — H25812 Combined forms of age-related cataract, left eye: Secondary | ICD-10-CM | POA: Diagnosis not present

## 2022-01-22 DIAGNOSIS — M4120 Other idiopathic scoliosis, site unspecified: Secondary | ICD-10-CM | POA: Diagnosis not present

## 2022-01-22 DIAGNOSIS — Z9889 Other specified postprocedural states: Secondary | ICD-10-CM | POA: Diagnosis not present

## 2022-01-22 DIAGNOSIS — I10 Essential (primary) hypertension: Secondary | ICD-10-CM | POA: Diagnosis not present

## 2022-01-22 DIAGNOSIS — G8929 Other chronic pain: Secondary | ICD-10-CM | POA: Diagnosis not present

## 2022-01-22 DIAGNOSIS — Z8679 Personal history of other diseases of the circulatory system: Secondary | ICD-10-CM | POA: Diagnosis not present

## 2022-01-22 DIAGNOSIS — Z96641 Presence of right artificial hip joint: Secondary | ICD-10-CM | POA: Diagnosis not present

## 2022-01-22 DIAGNOSIS — Z8639 Personal history of other endocrine, nutritional and metabolic disease: Secondary | ICD-10-CM | POA: Diagnosis not present

## 2022-01-30 DIAGNOSIS — M818 Other osteoporosis without current pathological fracture: Secondary | ICD-10-CM | POA: Diagnosis not present

## 2022-01-30 DIAGNOSIS — R636 Underweight: Secondary | ICD-10-CM | POA: Diagnosis not present

## 2022-01-30 DIAGNOSIS — Z993 Dependence on wheelchair: Secondary | ICD-10-CM | POA: Diagnosis not present

## 2022-01-30 DIAGNOSIS — S32000D Wedge compression fracture of unspecified lumbar vertebra, subsequent encounter for fracture with routine healing: Secondary | ICD-10-CM | POA: Diagnosis not present

## 2022-01-30 DIAGNOSIS — M419 Scoliosis, unspecified: Secondary | ICD-10-CM | POA: Diagnosis not present

## 2022-01-30 DIAGNOSIS — S72002E Fracture of unspecified part of neck of left femur, subsequent encounter for open fracture type I or II with routine healing: Secondary | ICD-10-CM | POA: Diagnosis not present

## 2022-01-30 DIAGNOSIS — S72001E Fracture of unspecified part of neck of right femur, subsequent encounter for open fracture type I or II with routine healing: Secondary | ICD-10-CM | POA: Diagnosis not present

## 2022-01-30 DIAGNOSIS — Z79891 Long term (current) use of opiate analgesic: Secondary | ICD-10-CM | POA: Diagnosis not present

## 2022-01-30 DIAGNOSIS — M810 Age-related osteoporosis without current pathological fracture: Secondary | ICD-10-CM | POA: Diagnosis not present

## 2022-02-08 DIAGNOSIS — H5709 Other anomalies of pupillary function: Secondary | ICD-10-CM | POA: Diagnosis not present

## 2022-02-08 DIAGNOSIS — H21561 Pupillary abnormality, right eye: Secondary | ICD-10-CM | POA: Diagnosis not present

## 2022-02-08 DIAGNOSIS — H25811 Combined forms of age-related cataract, right eye: Secondary | ICD-10-CM | POA: Diagnosis not present

## 2022-02-08 DIAGNOSIS — Z79899 Other long term (current) drug therapy: Secondary | ICD-10-CM | POA: Diagnosis not present

## 2022-02-08 DIAGNOSIS — H52221 Regular astigmatism, right eye: Secondary | ICD-10-CM | POA: Diagnosis not present

## 2022-02-08 DIAGNOSIS — I10 Essential (primary) hypertension: Secondary | ICD-10-CM | POA: Diagnosis not present

## 2022-02-18 ENCOUNTER — Ambulatory Visit: Payer: Medicare PPO

## 2022-02-21 ENCOUNTER — Ambulatory Visit (INDEPENDENT_AMBULATORY_CARE_PROVIDER_SITE_OTHER): Payer: Medicare PPO

## 2022-02-21 VITALS — Ht 67.0 in | Wt 94.0 lb

## 2022-02-21 DIAGNOSIS — Z0000 Encounter for general adult medical examination without abnormal findings: Secondary | ICD-10-CM | POA: Diagnosis not present

## 2022-02-21 NOTE — Patient Instructions (Addendum)
?  Nicole Butler , ?Thank you for taking time to come for your Medicare Wellness Visit. I appreciate your ongoing commitment to your health goals. Please review the following plan we discussed and let me know if I can assist you in the future.  ? ?These are the goals we discussed: ? Goals   ? ?  DIET - INCREASE WATER INTAKE   ?  Recommend drinking at least 6-8 glasses of water a day. ?  ? ?  ?  ?This is a list of the screening recommended for you and due dates:  ?Health Maintenance  ?Topic Date Due  ? Zoster (Shingles) Vaccine (1 of 2) 05/23/2022*  ? Flu Shot  06/04/2022  ? Colon Cancer Screening  05/01/2027  ? Pneumonia Vaccine  Completed  ? DEXA scan (bone density measurement)  Completed  ? Hepatitis C Screening: USPSTF Recommendation to screen - Ages 50-79 yo.  Completed  ? HPV Vaccine  Aged Out  ? Mammogram  Discontinued  ? Tetanus Vaccine  Discontinued  ? COVID-19 Vaccine  Discontinued  ?*Topic was postponed. The date shown is not the original due date.  ?  ?

## 2022-02-21 NOTE — Progress Notes (Signed)
Subjective:   Nicole Butler is a 76 y.o. female who presents for Medicare Annual (Subsequent) preventive examination.  Review of Systems    No ROS.  Medicare Wellness Virtual Visit.  Visual/audio telehealth visit, UTA vital signs.   See social history for additional risk factors.   Cardiac Risk Factors include: advanced age (>54men, >107 women)     Objective:    Today's Vitals   02/21/22 1033  Weight: 94 lb (42.6 kg)  Height: 5\' 7"  (1.702 m)   Body mass index is 14.72 kg/m.     02/21/2022   10:38 AM 06/21/2021   10:27 AM 05/23/2021    9:25 AM 02/15/2021   12:43 PM 02/15/2020   12:35 PM 08/05/2019    8:27 AM 07/15/2019    7:38 AM  Advanced Directives  Does Patient Have a Medical Advance Directive? Yes Yes No Yes Yes Yes Yes  Type of Estate agent of [place];Living will   Healthcare Power of [place];Living will Healthcare Power of [place];Living will Healthcare Power of [employer];Living will   Does patient want to make changes to medical advance directive? No - Patient declined   No - Patient declined No - Patient declined    Copy of Healthcare Power of Attorney in Chart? No - copy requested   No - copy requested No - copy requested    Would patient like information on creating a medical advance directive?   No - Patient declined        Current Medications (verified) Outpatient Encounter Medications as of 02/21/2022  Medication Sig   celecoxib (CELEBREX) 100 MG capsule Take 100 mg by mouth daily.   diclofenac sodium (VOLTAREN) 1 % GEL Apply topically 4 (four) times daily.   lidocaine (LIDODERM) 5 % Place 1 patch onto the skin every 12 (twelve) hours. Remove & Discard patch within 12 hours or as directed by MD   Lidocaine HCl 4 % SOLN Apply topically.   omeprazole (PRILOSEC) 40 MG capsule TAKE 1 CAPSULE BY MOUTH TWICE DAILY WITHFOOD   ondansetron (ZOFRAN) 4 MG tablet TAKE 1 TABLET BY MOUTH ONCE DAILY AS NEEDED NAUSEA AND VOMITING   oxyCODONE-acetaminophen  (PERCOCET/ROXICET) 5-325 MG tablet Take by mouth every 8 (eight) hours as needed.    polyethylene glycol (MIRALAX / GLYCOLAX) packet Take by mouth.   predniSONE (DELTASONE) 20 MG tablet Take 2 tablets once a day for 5 days   tiZANidine (ZANAFLEX) 2 MG tablet    traZODone (DESYREL) 50 MG tablet Take 2 tablets (100 mg total) by mouth at bedtime.   No facility-administered encounter medications on file as of 02/21/2022.    Allergies (verified) Butrans [buprenorphine] and Fosamax [alendronate sodium]   History: Past Medical History:  Diagnosis Date   Anemia    H/O    Arthritis    Broken femur (HCC)    left   Chondrodermatitis nodularis helicis    right [facility]   Complication of anesthesia    PT STATES SHE FORGETS TO BREATHE WHEN COMING OUT OF ANESTHESIA   GERD (gastroesophageal reflux disease)    Hip pain    left   Insomnia    Osteopenia    Osteoporosis    Thyroid disease    Past Surgical History:  Procedure Laterality Date   ANKLE SURGERY Left    ESOPHAGOGASTRODUODENOSCOPY (EGD) WITH PROPOFOL N/A 11/12/2018   Procedure: ESOPHAGOGASTRODUODENOSCOPY (EGD) WITH PROPOFOL;  Surgeon: [name], MD;  Location: [facility] ENDOSCOPY;  Service: Gastroenterology;  Laterality: N/A;  ESOPHAGOGASTRODUODENOSCOPY (EGD) WITH PROPOFOL N/A 12/17/2018   Procedure: ESOPHAGOGASTRODUODENOSCOPY (EGD) WITH PROPOFOL with Dilation;  Surgeon: [name], MD;  Location: [facility] ENDOSCOPY;  Service: Gastroenterology;  Laterality: N/A;   ESOPHAGOGASTRODUODENOSCOPY (EGD) WITH PROPOFOL N/A 07/15/2019   Procedure: ESOPHAGOGASTRODUODENOSCOPY (EGD) WITH PROPOFOL with Dilation;  Surgeon: [name], MD;  Location[facility] ENDOSCOPY;  Service: Gastroenterology;  Laterality: N/A;  Per Dr. Tobi Bastos, include steroid injection post-dilation   ESOPHAGOGASTRODUODENOSCOPY (EGD) WITH PROPOFOL N/A 08/05/2019   Procedure: ESOPHAGOGASTRODUODENOSCOPY (EGD) WITH PROPOFOL with Dilation;  Surgeon: [name], MD;  Location: [facility] ENDOSCOPY;   Service: Gastroenterology;  Laterality: N/A;  Per Dr. Tobi Bastos, keep Kenalog 10mg /ml for possible injection   ESOPHAGOGASTRODUODENOSCOPY (EGD) WITH PROPOFOL N/A 08/27/2019   Procedure: ESOPHAGOGASTRODUODENOSCOPY (EGD) WITH PROPOFOL with Dilation;  Surgeon: [name], MD;  Location: [facility] ENDOSCOPY;  Service: Gastroenterology;  Laterality: N/A;   ESOPHAGOGASTRODUODENOSCOPY (EGD) WITH PROPOFOL N/A 06/21/2021   Procedure: ESOPHAGOGASTRODUODENOSCOPY (EGD) WITH PROPOFOL;  Surgeon: [name], MD;  Location: [facility] ENDOSCOPY;  Service: Gastroenterology;  Laterality: N/A;   FOOT SURGERY Left    x3   FRACTURE SURGERY     HARDWARE REMOVAL Left 12/21/2015   Procedure: HARDWARE REMOVAL;  Surgeon: Juanell Fairly, MD;  Location: [facility] ORS;  Service: Orthopedics;  Laterality: Left;  Left HIP    HIP SURGERY Left    x2   KNEE SURGERY Right    x2   LEG SURGERY Left    x2   LUMBAR SPINE SURGERY     repair of compression fractures x 2 06/2021   TOTAL HIP ARTHROPLASTY Right    x4   Family History  Problem Relation Age of Onset   Alzheimer's disease Mother    Cancer Father        lung never smoker   Alcohol abuse Brother    Dementia Brother    Social History   Socioeconomic History   Marital status: Significant Other    Spouse name: Not on file   Number of children: Not on file   Years of education: Not on file   Highest education level: Associate degree: academic program  Occupational History   Not on file  Tobacco Use   Smoking status: Former    Packs/day: 1.00    Years: 10.00    Pack years: 10.00    Types: Cigarettes    Quit date: 12/15/1995    Years since quitting: 26.2   Smokeless tobacco: Never  Vaping Use   Vaping Use: Never used  Substance and Sexual Activity   Alcohol use: Yes    Alcohol/week: 14.0 standard drinks    Types: 14 Glasses of wine per week    Comment: 2 GLASSES EVERY EVENING   Drug use: No   Sexual activity: Not on file  Other Topics Concern   Not on file  Social  History Narrative   Lives with Nicole Butler    1 daughter [place], 1 son in [place]. Enjoyed training horses raised show horses, prev enjoyed water skiing/diving until her 51s when developed ortho issues   Social Determinants of Corporate investment banker Strain: Low Risk    Difficulty of Paying Living Expenses: Not hard at all  Food Insecurity: No Food Insecurity   Worried About Programme researcher, broadcasting/film/video in the Last Year: Never true   Barista in the Last Year: Never true  Transportation Needs: No Transportation Needs   Lack of Transportation (Medical): No   Lack of Transportation (Non-Medical):  No  Physical Activity: Not on file  Stress: No Stress Concern Present   Feeling of Stress : Not at all  Social Connections: Moderately Isolated   Frequency of Communication with Friends and Family: More than three times a week   Frequency of Social Gatherings with Friends and Family: More than three times a week   Attends Religious Services: Never   Database administrator or Organizations: No   Attends Engineer, structural: Never   Marital Status: Living with partner    Tobacco Counseling Counseling given: Not Answered   Clinical Intake:  Pre-visit preparation completed: Yes        Diabetes: No  How often do you need to have someone help you when you read instructions, pamphlets, or other written materials from your doctor or pharmacy?: 1 - Never    Interpreter Needed?: No      Activities of Daily Living    02/21/2022   10:39 AM  In your present state of health, do you have any difficulty performing the following activities:  Hearing? 0  Vision? 0  Difficulty concentrating or making decisions? 0  Walking or climbing stairs? 1  Dressing or bathing? 0  Doing errands, shopping? 0  Preparing Food and eating ? N  Using the Toilet? N  In the past six months, have you accidently leaked urine? Y  Comment Managed with daily pad  Do you have  problems with loss of bowel control? N  Managing your Medications? N  Managing your Finances? N  Housekeeping or managing your Housekeeping? N    Patient Care Team: McLean-Scocuzza, Pasty Spillers, MD as PCP - General (Internal Medicine) Gabriel Cirri, NP as PCP - Family Medicine (Nurse Practitioner) Buena Irish, MD (Student) Juanell Fairly, MD as Referring Physician (Orthopedic Surgery) Vanna Scotland, MD as Consulting Physician (Urology)  Indicate any recent Medical Services you may have received from other than [facility] providers in the past year (date may be approximate).     Assessment:   This is a routine wellness examination for Clarabel.  Virtual Visit via Telephone Note  I connected with  ANNY GROGG on 02/21/22 at 10:30 AM EDT by telephone and verified that I am speaking with the correct person using two identifiers.  Persons participating in the virtual visit: patient/Nurse Health Advisor   I discussed the limitations of performing an evaluation and management service by telehealth. The patient expressed understanding and agreed to proceed.  Interactive audio and video telecommunications were attempted between this nurse and patient, however failed, due to patient having technical difficulties OR patient did not have access to video capability.  We continued and completed visit with audio only.  Some vital signs may be absent or patient reported.   Hearing/Vision screen Hearing Screening - Comments:: Patient is able to hear conversational tones without difficulty. No issues reported. Vision Screening - Comments:: Wears corrective lenses    Dietary issues and exercise activities discussed:   Healthy diet   Goals Addressed             This Visit's Progress    DIET - INCREASE WATER INTAKE       Recommend drinking at least 6-8 glasses of water a day.       Depression Screen    02/21/2022   10:36 AM 02/15/2021   12:45 PM 02/15/2020   12:36 PM 12/15/2019     1:41 PM 09/22/2018   10:39 AM 06/17/2018   11:13 AM 08/27/2017  1:34 PM  PHQ 2/9 Scores  PHQ - 2 Score 0 0 0 0 3 0 3  PHQ- 9 Score     12  7    Fall Risk    02/21/2022   10:39 AM 08/30/2021   11:35 AM 02/15/2021   12:45 PM 02/15/2020   12:36 PM 12/15/2019    1:41 PM  Fall Risk   Falls in the past year? 0 0 0 0 0  Number falls in past yr:  0 0    Injury with Fall?  0 0    Risk for fall due to :  Impaired mobility Impaired mobility    Follow up Falls evaluation completed Falls evaluation completed Falls evaluation completed Falls evaluation completed Falls evaluation completed    FALL RISK PREVENTION PERTAINING TO THE HOME: Home free of loose throw rugs in walkways, pet beds, electrical cords, etc? Yes  Adequate lighting in your home to reduce risk of falls? Yes   ASSISTIVE DEVICES UTILIZED TO PREVENT FALLS: Use of a cane, walker or w/c? Yes   TIMED UP AND GO: Was the test performed? No .   Cognitive Function: Patient is alert and oriented x3.     02/15/2020   12:50 PM  MMSE - Mini Mental State Exam  Not completed: Unable to complete        06/17/2018   11:14 AM  6CIT Screen  What Year? 0 points  What month? 0 points  What time? 0 points  Count back from 20 0 points  Months in reverse 0 points  Repeat phrase 0 points  Total Score 0 points    Immunizations Immunization History  Administered Date(s) Administered   Influenza, High Dose Seasonal PF 08/27/2017, 08/17/2018, 06/29/2019, 07/06/2021   Influenza-Unspecified 08/17/2015, 06/19/2016   PFIZER(Purple Top)SARS-COV-2 Vaccination 01/04/2020, 01/25/2020   Pneumococcal Conjugate-13 10/03/2014   Pneumococcal Polysaccharide-23 08/17/2015    Shingrix Completed?: No.    Education has been provided regarding the importance of this vaccine. Patient has been advised to call insurance company to determine out of pocket expense if they have not yet received this vaccine. Advised may also receive vaccine at local pharmacy  or Health Dept. Verbalized acceptance and understanding.  Screening Tests Health Maintenance  Topic Date Due   Zoster Vaccines- Shingrix (1 of 2) 05/23/2022 (Originally 10/09/1997)   INFLUENZA VACCINE  06/04/2022   COLONOSCOPY (Pts 45-11yrs Insurance coverage will need to be confirmed)  05/01/2027   Pneumonia Vaccine 93+ Years old  Completed   DEXA SCAN  Completed   Hepatitis C Screening  Completed   HPV VACCINES  Aged Out   MAMMOGRAM  Discontinued   TETANUS/TDAP  Discontinued   COVID-19 Vaccine  Discontinued   Health Maintenance There are no preventive care reminders to display for this patient.  Lung Cancer Screening: (Low Dose CT Chest recommended if Age 37-80 years, 30 pack-year currently smoking OR have quit w/in 15years.) does not qualify.   Vision Screening: Recommended annual ophthalmology exams for early detection of glaucoma and other disorders of the eye.  Dental Screening: Recommended annual dental exams for proper oral hygiene  Community Resource Referral / Chronic Care Management: CRR required this visit?  No   CCM required this visit?  No      Plan:   Keep all routine maintenance appointments.   I have personally reviewed and noted the following in the patient's chart:   Medical and social history Use of alcohol, tobacco or illicit drugs  Current medications and  supplements including opioid prescriptions. Taking oxycodone, followed by [facility] pain clinic.  Functional ability and status Nutritional status Physical activity Advanced directives List of other physicians Hospitalizations, surgeries, and ER visits in previous 12 months Vitals Screenings to include cognitive, depression, and falls Referrals and appointments  In addition, I have reviewed and discussed with patient certain preventive protocols, quality metrics, and best practice recommendations. A written personalized care plan for preventive services as well as general preventive health  recommendations were provided to patient.     Ashok Pall, LPN   1/61/0960

## 2022-03-26 DIAGNOSIS — G894 Chronic pain syndrome: Secondary | ICD-10-CM | POA: Diagnosis not present

## 2022-03-26 DIAGNOSIS — S32010D Wedge compression fracture of first lumbar vertebra, subsequent encounter for fracture with routine healing: Secondary | ICD-10-CM | POA: Diagnosis not present

## 2022-03-26 DIAGNOSIS — F1190 Opioid use, unspecified, uncomplicated: Secondary | ICD-10-CM | POA: Diagnosis not present

## 2022-04-15 DIAGNOSIS — M79675 Pain in left toe(s): Secondary | ICD-10-CM | POA: Diagnosis not present

## 2022-04-15 DIAGNOSIS — B351 Tinea unguium: Secondary | ICD-10-CM | POA: Diagnosis not present

## 2022-04-15 DIAGNOSIS — M79674 Pain in right toe(s): Secondary | ICD-10-CM | POA: Diagnosis not present

## 2022-04-17 DIAGNOSIS — M8440XA Pathological fracture, unspecified site, initial encounter for fracture: Secondary | ICD-10-CM | POA: Diagnosis not present

## 2022-04-17 DIAGNOSIS — M438X5 Other specified deforming dorsopathies, thoracolumbar region: Secondary | ICD-10-CM | POA: Diagnosis not present

## 2022-04-17 DIAGNOSIS — M810 Age-related osteoporosis without current pathological fracture: Secondary | ICD-10-CM | POA: Diagnosis not present

## 2022-04-23 ENCOUNTER — Other Ambulatory Visit: Payer: Self-pay | Admitting: Gastroenterology

## 2022-04-23 DIAGNOSIS — K219 Gastro-esophageal reflux disease without esophagitis: Secondary | ICD-10-CM

## 2022-05-29 DIAGNOSIS — S32000D Wedge compression fracture of unspecified lumbar vertebra, subsequent encounter for fracture with routine healing: Secondary | ICD-10-CM | POA: Diagnosis not present

## 2022-05-29 DIAGNOSIS — Z79891 Long term (current) use of opiate analgesic: Secondary | ICD-10-CM | POA: Diagnosis not present

## 2022-05-29 DIAGNOSIS — M818 Other osteoporosis without current pathological fracture: Secondary | ICD-10-CM | POA: Diagnosis not present

## 2022-05-29 DIAGNOSIS — Z0289 Encounter for other administrative examinations: Secondary | ICD-10-CM | POA: Diagnosis not present

## 2022-05-29 DIAGNOSIS — Z993 Dependence on wheelchair: Secondary | ICD-10-CM | POA: Diagnosis not present

## 2022-05-29 DIAGNOSIS — M419 Scoliosis, unspecified: Secondary | ICD-10-CM | POA: Diagnosis not present

## 2022-05-29 DIAGNOSIS — S22000S Wedge compression fracture of unspecified thoracic vertebra, sequela: Secondary | ICD-10-CM | POA: Diagnosis not present

## 2022-06-18 DIAGNOSIS — S32010D Wedge compression fracture of first lumbar vertebra, subsequent encounter for fracture with routine healing: Secondary | ICD-10-CM | POA: Diagnosis not present

## 2022-06-18 DIAGNOSIS — G894 Chronic pain syndrome: Secondary | ICD-10-CM | POA: Diagnosis not present

## 2022-06-18 DIAGNOSIS — F1190 Opioid use, unspecified, uncomplicated: Secondary | ICD-10-CM | POA: Diagnosis not present

## 2022-06-18 DIAGNOSIS — M545 Low back pain, unspecified: Secondary | ICD-10-CM | POA: Diagnosis not present

## 2022-06-18 DIAGNOSIS — G4700 Insomnia, unspecified: Secondary | ICD-10-CM | POA: Diagnosis not present

## 2022-08-01 DIAGNOSIS — S32000D Wedge compression fracture of unspecified lumbar vertebra, subsequent encounter for fracture with routine healing: Secondary | ICD-10-CM | POA: Diagnosis not present

## 2022-08-01 NOTE — Progress Notes (Signed)
 Patient Name: Nicole Butler Patient Age: 76 y.o. Encounter Date: 08/01/22   Referring Physician: Emelia Nicole Rouse, MD [address] [place],  [place] [address] Primary Care Provider: McLean-Scocuzza, Nicole Nat, MD   SUBJECTIVE    Reason for Visit: Kyphoplasty  History of Present Illness: Nicole Butler is a 76 y.o. female who is seen in consultation at the request of Nicole Butler for evaluation of vertebral body fractures. She is s/p kyphoplasty of  L1 and L2 on 06/25/21 by myself. She did very well for ~8 months post-op. Unfortunately she has developed new fractures since then. No inciting event. She tried very hard to come see me sooner but couldn't get ahold of me so ended up presenting to [facility] ED in order to get an x-ray which showed 2-3 new fractures, uncertain of the levels. She is acutely tender and this is significantly affecting her QOL. She is just tired of having back pain.   She has been started on medication for her significant osteoporosis. As a recap, she has significant osteoporosis with numerous fractures. History of bilateral frequent shoulder dislocation. Her last kypho was performed with moderate sedation.  Past Medical History: Past Medical History:  Diagnosis Date  . Chronic pain syndrome   . Osteoarthritis   . Osteoporosis   . Scoliosis     Past Surgical History: Past Surgical History:  Procedure Laterality Date  . IR PERC AUGMENT LUMBAR 1 VERT BODY  06/25/2021   IR PERC AUGMENT LUMBAR 1 VERT BODY 06/25/2021 Nicole Butler Emelia, MD IMG VIR [facility]V [facility]  . JOINT REPLACEMENT    . multiple orthopedic surgeries      Family History: Family History  Problem Relation Age of Onset  . Melanoma Neg Hx   . Basal cell carcinoma Neg Hx   . Squamous cell carcinoma Neg Hx      Allergies: Allergies  Allergen Reactions  . Bisphosphonates Other (See Comments) and Anxiety    GI spasms feels like I am swallowing something big  dysphagia  GI spasms  feels like I am swallowing something big  Other reaction(s): Other (See Comments)  dysphagia  dysphagia  . Butrans [Buprenorphine] Hives and Rash  . Alendronate Sodium Other (See Comments)    dysphagia  . Fosamax [Alendronate]     Other reaction(s): Other (See Comments) dysphagia     Anticoagulant/Antiplatelet Medications: No anticoagulant medications  OBJECTIVE   Physical Exam: Vitals:   08/01/22 1026  BP: 132/76  Pulse: 67  Resp: 14  Temp: 36.6 C (97.9 F)  SpO2: 94%   Body mass index is 14.41 kg/m.  General: alert, distressed Respiratory: breathing comfortably on room air Cardiovascular: regular rate MSK:  tenderness of mid T and lower L spine as well as R paraspinal muscles Skin: grossly normal exam Neurologic: grossly normal, alert & oriented x3  Point-of-care US  performed: Not performed  Pertinent Laboratory Values: Lab Results  Component Value Date   WBC 7.2 06/25/2021   HGB 12.0 06/25/2021   HCT 35.3 06/25/2021   PLT 308 06/25/2021   Lab Results  Component Value Date   BUN 13 01/30/2022   No results found for: PTINR, APTT  08/01/2022, labs were reviewed by me personally. Interpretation: normal, no INR, and based on labs recommend no new labs needed.  Pertinent Imaging Studies: 08/01/2022, imaging was visualized and reviewed by me personally. Interpretation: Xray June 2023, and based on imaging recommend obtaining MRI  ASA Score: ASA 3 - Patient with moderate  systemic disease with functional limitations  Performance Status: 3 = Capable of only limited selfcare, confined to bed or chair more than 50% of waking hours  ASSESSMENT/PLAN   Nicole Butler is a 76 y.o. female with new back pain and history of severe osteoporosis and kyphoplasty. Given her new pain, recommend obtaining additional imaging. Her severe osteoporosis will limit CT and therefore recommend MRI T and L spine. Following this, will schedule kyphoplasty if  appropriate.  Kyphoplasty was discussed with her, should it be appropriate, and she is interested in proceeding forward. I will call her once her MRIs are obtained.  Nicole DELENA Knee, MD 08/01/22 11:53 AM

## 2022-08-16 DIAGNOSIS — M4856XD Collapsed vertebra, not elsewhere classified, lumbar region, subsequent encounter for fracture with routine healing: Secondary | ICD-10-CM | POA: Diagnosis not present

## 2022-08-16 DIAGNOSIS — M4854XD Collapsed vertebra, not elsewhere classified, thoracic region, subsequent encounter for fracture with routine healing: Secondary | ICD-10-CM | POA: Diagnosis not present

## 2022-08-23 DIAGNOSIS — M79674 Pain in right toe(s): Secondary | ICD-10-CM | POA: Diagnosis not present

## 2022-08-23 DIAGNOSIS — M79675 Pain in left toe(s): Secondary | ICD-10-CM | POA: Diagnosis not present

## 2022-08-23 DIAGNOSIS — B351 Tinea unguium: Secondary | ICD-10-CM | POA: Diagnosis not present

## 2022-12-09 NOTE — Progress Notes (Signed)
 Department of Anesthesiology [facility] [address], [address] [place], [place] [address] [phone]  Chronic Pain Follow Up Note  Assessment and Plan Nicole Butler is a 77 y.o. being followed at [facility] for complaint of chronic pain localized to bilateral lower back and diffusely throughout her body.   Compression Fractures  - Improved with pain medication increase previously - Diffuse joint pain throughout low back and BLE - Continue percocet 5/325mg , take 2 in AM, 1 in afternoon and 2 at night; refilled  - Will consider referral to Neurosurgery for SCS appropriateness if patient desires in future   Chronic Pain Syndrome; Primary osteoarthritis of hip, unspecified laterality  - Stable - Encouraged decreased use of daily NSAIDs, reports recently had blood work done by endocrinology which was all good per patient - Continue Robaxin 500 mg TID; refilled - Continue Trazodone  100 mg nightly; refilled  - Continue Voltaren gel; refilled  Continuous use of Opioids  - Compliant - Pill count appropriate today - Patient reports pharmacy says they don't have rx for 2/5 despite us  having received confirmation of receipt when sent at her November appointment, resent - UDS/treatment agreement is due today - Pt notes some daytime drowsiness, reports she often doesn't sleep well at night; discussed that medications could be causing though she doesn't associate them with doing so, reports fatigue PRIOR to increase in oxycodone  dosing; discussed things like kidney dysfunction can result in build up of medication/metabolites, reports recent labs were normal  Patient does  appear to be utilizing pain medications appropriately and does  report that the medications do improve patient's quality of life and functionality level.  At today's visit, the patient reports fair analgesia from their current medication regimen without significant adverse  effects.  PDMP reviewed today. Last urine toxicology: 12/28/2021, appropriately positive for oxycodone , negative for all others. Update Today Last urine toxicology screen was appropriate.  Opioid Treatment Agreement: 12/28/21 Update Today UDS Frequency: Yearly Patient brought medications to the clinic visit?  Yes Medication supply appropriate?  N/A Opioid Risk Tool:low risk. 0 (points) on 06/01/21 Pain Psychology: N/A Naloxone ordered: no Total morphine  equivalents: 45 Goals of opioids: improved functionality Reason for >90mg  MME: N/A Benzodiazepine: No.  Return in about 3 months (around 03/07/2023).  Requested Prescriptions   Signed Prescriptions Disp Refills  . oxyCODONE -acetaminophen  (PERCOCET) 5-325 mg per tablet 180 tablet 0    Sig: Take 2 in AM, 2 midday and 2 at night as needed. DNF 02/07/23  . oxyCODONE -acetaminophen  (PERCOCET) 5-325 mg per tablet 180 tablet 0    Sig: Take 2 in AM, 2 midday and 2 at night as needed. DNF 01/08/23  . oxyCODONE -acetaminophen  (PERCOCET) 5-325 mg per tablet 180 tablet 0    Sig: Take 2 in AM, 1 midday and 2 at night as needed. DNF 12/09/22  . traZODone  (DESYREL ) 50 MG tablet 180 tablet 1    Sig: Take 2 tablets (100 mg total) by mouth nightly.   Orders Placed This Encounter  Procedures  . Drug Screen, [facility], Urine    Order Specific Question:   Patient's Current Medications    Answer:   Not provided    Order Specific Question:   Release to patient    Answer:   Immediate [1]    Risks and benefits of above medications including but not limited to possibility of respiratory depression, sedation, and even death were discussed with the patient who expressed an understanding.   Patient  was advised to present to the next visit with medication bottles available for a pill/patch count.  HPI Nicole Butler is a 77 y.o. being followed at [facility] for complaint of chronic pain localized to bilateral lower back.    At last visit in  November, the patient presented reporting improved pain with pain medication increase at last visit. She confirmed better functionality with increase. The patient stated she had spinal stenosis. She noted her pain was all over and reported pain in her low back. The patient reported she felt pain in her joints. She denied any shooting pain down her lower extremities. The patient stated she did not have any new compression fractures on imaging.    Current view: Showing all answers       [facility] Return Patient Questionnaire     Question 12/08/2022  2:48 PM EST - Filed by Patient   What is the reason for your visit?     Date of onset of your pain: 11/04/1996   Please rate your pain at its WORST in the past month. 8   Please rate your pain at its LEAST in the past month. 6   Please rate your pain as it is RIGHT NOW. 7   Please rate your pain on AVERAGE in the past month. 7   Please circle the location of your pain.     Please select the words that describe your pain. Aching    Dull    Painful Cold    Pulsing    Sharp    shooting    Stabbing Tender Throbbing   How often do you have pain? All the time   When is your pain the worst? Mornings   Which of the following have been negatively affected by your pain? Enjoyment of life    General activity    Normal work    Recreational activities    Sleep    Walking    Standing   Since your last visit:    Have you had any of the following?     Do you have any new pain you would like to discuss with your doctor? No   How has your pain changed? Stayed the same   Are you currently taking any blood-thinners or anticoagulants? No   If you are on Pain Medication - Are you having any of the following? Drowsiness/Sleepiness    Constipation    Dizziness    I am stable on my current medication regime   If you have had a procedure since your last visit, how much pain relief was obtained?     If you have had a procedure  since your last visit, were there any complications?     General: Fatigue    Difficulty Sleeping    Daytime Drowsiness   Cardiovascular:     Gastrointestinal - (Intestinal): Constipation    Gastric Reflux   Skin: Dryness   Endocrine (Hormonal System): Thinning hair    Intolerance to heat or cold   Musculoskeletal System - (Muscles, Joints and Coverings): Joint Aches/Swelling    Spasms/Spasticity/Cramps    Back pain    Muscle Aches/Weakness   Neurologic:     Psychiatric:        [facility] Patient-Entered Hpi Selection Questionnaire     Question 12/08/2022  2:53 PM EST - Filed by Patient   What is the primary reason for your visit? Back Pain   Your back pain is  a... chronic problem   When did you first notice your back pain? More than 1 year ago   How often do you feel back pain? 2 to 4 times per day   Since you first noticed your back pain, how has it changed? Always present, but gets better and worse   Where is your back pain located? Lower back - below the waist   How would you describe your back pain? Aching   Where does your back pain spread? Left foot    Left knee    Right foot    Right knee   On a scale of 0 to 10 (10 being the worst), how strong is your back pain? 8   Your back pain is... worse during the day   What makes your back pain worse? Bending    Certain positions    Sitting    Standing   When do you feel stiffness in your back? At night   Are you experiencing any of the following symptoms with your back pain?    Abdominal pain No   Bladder incontinence No   Bowel incontinence No   Chest pain     Painful urination No   Fever No   Headaches     Leg pain Yes   Numbness Yes   Paralysis     Pins and needles     Pelvic pain     Numbness around the anus     Tingling     Weakness Yes   Weight loss     Do any of the following apply to you? History of osteoporosis    Lack of exercise    Poor posture    Inactive lifestyle        Previous  Responses     Op Gct Opt Outs     Question 05/29/2022 11:42 AM EST - Filed by Patient Representative (Spouse)   Would you like to be excluded from the patient list? No   Would you like for us  to withhold information about you from your family and friends? No   Would you like for us  to withhold information about you from community clergy? No        Medications allow me to: sleep better  Patients current medication regimen: - Percocet 5/325mg  2 in the AM, 1 in the afternoon, 2 in the PM - Robaxin 500 TID - Voltaren gel - Trazodone  100 mg nightly    Current view: Showing all answers       [facility] Return Patient Questionnaire     Question 12/08/2022  2:48 PM EST - Filed by Patient   What is the reason for your visit?     Date of onset of your pain: 11/04/1996   Please rate your pain at its WORST in the past month. 8   Please rate your pain at its LEAST in the past month. 6   Please rate your pain as it is RIGHT NOW. 7   Please rate your pain on AVERAGE in the past month. 7   Please circle the location of your pain.     Please select the words that describe your pain. Aching    Dull    Painful Cold    Pulsing    Sharp    shooting    Stabbing Tender Throbbing   How often do you have pain? All the time   When is your pain the worst? Mornings  Which of the following have been negatively affected by your pain? Enjoyment of life    General activity    Normal work    Recreational activities    Sleep    Walking    Standing   Since your last visit:    Have you had any of the following?     Do you have any new pain you would like to discuss with your doctor? No   How has your pain changed? Stayed the same   Are you currently taking any blood-thinners or anticoagulants? No   If you are on Pain Medication - Are you having any of the following? Drowsiness/Sleepiness    Constipation    Dizziness    I am stable on my current medication regime   If  you have had a procedure since your last visit, how much pain relief was obtained?     If you have had a procedure since your last visit, were there any complications?     General: Fatigue    Difficulty Sleeping    Daytime Drowsiness   Cardiovascular:     Gastrointestinal - (Intestinal): Constipation    Gastric Reflux   Skin: Dryness   Endocrine (Hormonal System): Thinning hair    Intolerance to heat or cold   Musculoskeletal System - (Muscles, Joints and Coverings): Joint Aches/Swelling    Spasms/Spasticity/Cramps    Back pain    Muscle Aches/Weakness   Neurologic:     Psychiatric:        [facility] Patient-Entered Hpi Selection Questionnaire     Question 12/08/2022  2:53 PM EST - Filed by Patient   What is the primary reason for your visit? Back Pain   Your back pain is a... chronic problem   When did you first notice your back pain? More than 1 year ago   How often do you feel back pain? 2 to 4 times per day   Since you first noticed your back pain, how has it changed? Always present, but gets better and worse   Where is your back pain located? Lower back - below the waist   How would you describe your back pain? Aching   Where does your back pain spread? Left foot    Left knee    Right foot    Right knee   On a scale of 0 to 10 (10 being the worst), how strong is your back pain? 8   Your back pain is... worse during the day   What makes your back pain worse? Bending    Certain positions    Sitting    Standing   When do you feel stiffness in your back? At night   Are you experiencing any of the following symptoms with your back pain?    Abdominal pain No   Bladder incontinence No   Bowel incontinence No   Chest pain     Painful urination No   Fever No   Headaches     Leg pain Yes   Numbness Yes   Paralysis     Pins and needles     Pelvic pain     Numbness around the anus     Tingling     Weakness Yes   Weight loss     Do any of the following apply  to you? History of osteoporosis    Lack of exercise    Poor posture    Inactive lifestyle  Previous Responses     Op Gct Opt Outs     Question 05/29/2022 11:42 AM EST - Filed by Patient Representative (Spouse)   Would you like to be excluded from the patient list? No   Would you like for us  to withhold information about you from your family and friends? No   Would you like for us  to withhold information about you from community clergy? No       Patient denies homicidal/suicidal ideation.    Allergies Allergies  Allergen Reactions  . Bisphosphonates Other (See Comments) and Anxiety    GI spasms feels like I am swallowing something big  dysphagia  GI spasms feels like I am swallowing something big  Other reaction(s): Other (See Comments)  dysphagia  dysphagia  . Butrans [Buprenorphine] Hives and Rash  . Alendronate Sodium Other (See Comments)    dysphagia  . Fosamax [Alendronate]     Other reaction(s): Other (See Comments) dysphagia    Home Medications   Current Outpatient Medications  Medication Sig Dispense Refill  . diclofenac sodium (VOLTAREN) 1 % gel Apply 2 g topically daily. Ok to fill generic 150 g 5  . FLUAD vaccine (29YR UP)-MF59C(PF) syringe 2017-18     . lidocaine  (XYLOCAINE ) 5 % ointment lidocaine  5 % topical ointment    . methocarbamoL (ROBAXIN) 500 MG tablet Take 1 tablet (500 mg total) by mouth Three (3) times a day as needed. 270 tablet 1  . omeprazole  (PRILOSEC) 40 MG capsule Take 1 capsule (40 mg total) by mouth daily.    . ondansetron  (ZOFRAN ) 4 MG tablet 1 tablet (4 mg total).    . oxyCODONE -acetaminophen  (PERCOCET) 5-325 mg per tablet Take 2 in AM, 1 midday and 2 at night as needed. DNF 11/09/22 180 tablet 0  . [START ON 02/07/2023] oxyCODONE -acetaminophen  (PERCOCET) 5-325 mg per tablet Take 2 in AM, 2 midday and 2 at night as needed. DNF 02/07/23 180 tablet 0  . [START ON 01/08/2023] oxyCODONE -acetaminophen  (PERCOCET) 5-325 mg per tablet  Take 2 in AM, 2 midday and 2 at night as needed. DNF 01/08/23 180 tablet 0  . oxyCODONE -acetaminophen  (PERCOCET) 5-325 mg per tablet Take 2 in AM, 1 midday and 2 at night as needed. DNF 12/09/22 180 tablet 0  . pen needle, diabetic 32 gauge x 5/32 (4 mm) Ndle Appropriate pen needles for Forteo syringe, use daily 100 each E  . polyethylene glycol (GLYCOLAX ) 17 gram/dose powder     . polyethylene glycol (MIRALAX ) 17 gram packet Take 17 g by mouth daily as needed.    . teriparatide (FORTEO) 20 mcg/dose (671mcg/2.4mL) injection Inject 0.08 mL (20 mcg total) under the skin daily. 2.4 mL 11  . traZODone  (DESYREL ) 50 MG tablet Take 2 tablets (100 mg total) by mouth nightly. 180 tablet 1  . UNABLE TO FIND Frequency:PHARMDIR   Dosage:0.0     Instructions:  Note:[facility] - Treatment agreement: explained ,signed and copy given to patient, original sent to medical records. 11/18/2012 [facility] Dose: NONE     No current facility-administered medications for this visit.    Previous Medication Trials: Tizanidine  (not helpful)  Previous Interventions L1-L2 Kyphoplasty 06/2021 - 80% improved  Review Of Systems See questionnaire above.    Answers submitted by the patient for this visit: Back Pain Questionnaire (Submitted on 12/08/2022) Chief Complaint: Back pain Chronicity: chronic Onset: more than 1 year ago Frequency: 2 to 4 times per day Progression since onset: waxing and waning Pain location: sacro-iliac  Pain quality: aching Radiates to: left foot, left knee, right foot, right knee Pain - numeric: 8/10 Pain is: worse during the day Aggravated by: bending, position, sitting, standing Stiffness is present: at night abdominal pain: No bladder incontinence: No bowel incontinence: No dysuria: No fever: No leg pain: Yes numbness: Yes weakness: Yes Risk factors: history of osteoporosis, lack of exercise, poor posture, sedentary lifestyle  Physical Exam  VITALS:  Vitals:   12/09/22  1237  BP: 131/77  Pulse: 86  Resp: 16  Temp: 36.2 C (97.2 F)  SpO2: (!) 85%     Wt Readings from Last 3 Encounters:  12/09/22 41.7 kg (92 lb)  08/01/22 41.7 kg (92 lb)  03/26/22 42.3 kg (93 lb 3.2 oz)   GENERAL: The patient is well developed, well-nourished and appears to be in Moderate distress. The patient is pleasant and interactive. Patient is a good historian. HEAD/NECK:   Reveals normocephalic/atraumatic. Clear sclera.  LUNGS:  Normal work of breathing, no supplemental O2. EXTREMITIES: No clubbing, cyanosis noted. NEUROLOGIC:   The patient was alert and oriented, speech fluent, normal language.  MUSCULOSKELETAL:   Pt on motorized chair. SKIN:  No obvious rashes, lesions, or erythema. PSY: Appropriate affect.

## 2023-04-10 ENCOUNTER — Other Ambulatory Visit: Payer: Self-pay | Admitting: Gastroenterology

## 2023-04-10 DIAGNOSIS — K219 Gastro-esophageal reflux disease without esophagitis: Secondary | ICD-10-CM

## 2023-06-04 ENCOUNTER — Encounter (INDEPENDENT_AMBULATORY_CARE_PROVIDER_SITE_OTHER): Payer: Self-pay

## 2023-06-19 ENCOUNTER — Encounter: Payer: Self-pay | Admitting: Family Medicine

## 2023-06-19 ENCOUNTER — Ambulatory Visit: Payer: Medicare PPO | Admitting: Family Medicine

## 2023-06-19 VITALS — BP 124/72 | HR 73 | Temp 98.0°F | Resp 16 | Ht 67.0 in

## 2023-06-19 DIAGNOSIS — R7309 Other abnormal glucose: Secondary | ICD-10-CM

## 2023-06-19 DIAGNOSIS — M810 Age-related osteoporosis without current pathological fracture: Secondary | ICD-10-CM

## 2023-06-19 DIAGNOSIS — E538 Deficiency of other specified B group vitamins: Secondary | ICD-10-CM | POA: Diagnosis not present

## 2023-06-19 DIAGNOSIS — D7589 Other specified diseases of blood and blood-forming organs: Secondary | ICD-10-CM | POA: Diagnosis not present

## 2023-06-19 DIAGNOSIS — F39 Unspecified mood [affective] disorder: Secondary | ICD-10-CM

## 2023-06-19 DIAGNOSIS — E559 Vitamin D deficiency, unspecified: Secondary | ICD-10-CM

## 2023-06-19 DIAGNOSIS — G894 Chronic pain syndrome: Secondary | ICD-10-CM

## 2023-06-19 DIAGNOSIS — R059 Cough, unspecified: Secondary | ICD-10-CM

## 2023-06-19 MED ORDER — BENZONATATE 100 MG PO CAPS: 100.0000 mg | ORAL_CAPSULE | Freq: Three times a day (TID) | ORAL | 0 refills | Status: DC | PRN

## 2023-06-19 NOTE — Patient Instructions (Addendum)
It was a pleasure meeting you today. Thank you for allowing me to take part in your health care.  Our goals for today as we discussed include:  We will get some labs today.  If they are abnormal or we need to do something about them, I will call you.  If they are normal, I will send you a message on MyChart (if it is active) or a letter in the mail.  If you don't hear from [place] in 2 weeks, please call the office at the number below.   Tessalon pearls for cough If no improvement follow up with PCP  Follow up as needed  If you have any questions or concerns, please do not hesitate to call the office at [phone].  I look forward to our next visit and until then take care and stay safe.  Regards,   Dana Allan, MD   [facility]

## 2023-06-19 NOTE — Progress Notes (Signed)
SUBJECTIVE:   Chief Complaint  Patient presents with   Establish Care   HPI Presents to transfer care  No acute concerns  Mood disorder Takes Trazodone 100 mg at night.  Denies SI/HI.  Has not previously seen psychiatry or therapist.  Last prescribed by Pain management.  Chronic Pain Follows with [facility] pain management.  Chronic Opioid use for bilateral shoulder and knee pain.  Requesting Tessalon pearls for cough s/p recent viral URI.  No worsening symptoms.  Lingering cough.  Tessalon Pears have been effective in past.   PERTINENT PMH / PSH: H/O compression fracture L1 OA Chronic pain syndrome Mood disorder   OBJECTIVE:  BP 124/72   Pulse 73   Temp 98 F (36.7 C)   Resp 16   Ht 5\' 7"  (1.702 m)   SpO2 95%   BMI 14.72 kg/m    Physical Exam Vitals reviewed.  Constitutional:      General: She is not in acute distress.    Appearance: She is not ill-appearing.  HENT:     Head: Normocephalic.     Right Ear: Tympanic membrane, ear canal and external ear normal.     Left Ear: Tympanic membrane, ear canal and external ear normal.     Nose: Nose normal.     Mouth/Throat:     Mouth: Mucous membranes are moist.  Eyes:     Extraocular Movements: Extraocular movements intact.     Conjunctiva/sclera: Conjunctivae normal.     Pupils: Pupils are equal, round, and reactive to light.  Neck:     Thyroid: No thyromegaly or thyroid tenderness.     Vascular: No carotid bruit.  Cardiovascular:     Rate and Rhythm: Normal rate and regular rhythm.     Pulses: Normal pulses.     Heart sounds: Normal heart sounds.  Pulmonary:     Effort: Pulmonary effort is normal.     Breath sounds: Normal breath sounds.  Abdominal:     General: Bowel sounds are normal. There is no distension.     Palpations: Abdomen is soft.     Tenderness: There is no abdominal tenderness. There is no right CVA tenderness, left CVA tenderness, guarding or rebound.  Musculoskeletal:        General: Normal  range of motion.     Cervical back: Normal range of motion.     Right lower leg: No edema.     Left lower leg: No edema.  Lymphadenopathy:     Cervical: No cervical adenopathy.  Skin:    Capillary Refill: Capillary refill takes less than 2 seconds.  Neurological:     General: No focal deficit present.     Mental Status: She is alert and oriented to person, place, and time. Mental status is at baseline.     Motor: No weakness.  Psychiatric:        Mood and Affect: Mood normal.        Behavior: Behavior normal.        Thought Content: Thought content normal.        Judgment: Judgment normal.        06/19/2023    2:07 PM 02/21/2022   10:36 AM 02/15/2021   12:45 PM 02/15/2020   12:36 PM 12/15/2019    1:41 PM  Depression screen PHQ 2/9  Decreased Interest 1 0 0 0 0  Down, Depressed, Hopeless 0 0 0 0 0  PHQ - 2 Score 1 0 0 0 0  Altered sleeping  1      Tired, decreased energy 1      Change in appetite 1      Feeling bad or failure about yourself  0      Trouble concentrating 0      Moving slowly or fidgety/restless 0      Suicidal thoughts 0      PHQ-9 Score 4      Difficult doing work/chores Somewhat difficult          06/19/2023    2:07 PM  GAD 7 : Generalized Anxiety Score  Nervous, Anxious, on Edge 0  Control/stop worrying 0  Worry too much - different things 0  Trouble relaxing 1  Restless 0  Easily annoyed or irritable 0  Afraid - awful might happen 0  Total GAD 7 Score 1  Anxiety Difficulty Somewhat difficult    ASSESSMENT/PLAN:  Osteoporosis, unspecified osteoporosis type, unspecified pathological fracture presence Assessment & Plan: Follows with Endocrinology at [facility] Bisphosphonate's and unable to tolerate due to GI side effects Will discuss with patient at next visit if currently receiving therapy    Orders: -     Comprehensive metabolic panel  Vitamin D deficiency Assessment & Plan: Check Vitamin D level  Orders: -     VITAMIN D 25 Hydroxy  (Vit-D Deficiency, Fractures)  Macrocytosis -     CBC  Vitamin B 12 deficiency Assessment & Plan: Check Vitamin B 12 level  Orders: -     Vitamin B12  Abnormal glucose -     Hemoglobin A1c  Mood disorder (HCC) Assessment & Plan: Chronic Tolerating Trazodone 100 mg at night, managed by pain management Recommend CBT     Chronic pain syndrome Assessment & Plan: Follows with [facility] pain management Not taking Tizanidine Currently taking Percocet 5-325 mg q8h    Cough in adult Assessment & Plan: Post viral syndrome Refill Tessalon pearls x 20 tabs  Orders: -     Benzonatate; Take 1 capsule (100 mg total) by mouth 3 (three) times daily as needed for cough.  Dispense: 20 capsule; Refill: 0   PDMP reviewed  Return if symptoms worsen or fail to improve.  Dana Allan, MD

## 2023-06-20 LAB — CBC
HCT: 40.4 % (ref 36.0–46.0)
Hemoglobin: 13 g/dL (ref 12.0–15.0)
MCHC: 32.2 g/dL (ref 30.0–36.0)
MCV: 98.4 fl (ref 78.0–100.0)
Platelets: 313 10*3/uL (ref 150.0–400.0)
RBC: 4.1 Mil/uL (ref 3.87–5.11)
RDW: 13.3 % (ref 11.5–15.5)
WBC: 6.8 10*3/uL (ref 4.0–10.5)

## 2023-06-20 LAB — VITAMIN D 25 HYDROXY (VIT D DEFICIENCY, FRACTURES): VITD: 54.51 ng/mL (ref 30.00–100.00)

## 2023-06-20 LAB — COMPREHENSIVE METABOLIC PANEL
ALT: 8 U/L (ref 0–35)
AST: 17 U/L (ref 0–37)
Albumin: 4 g/dL (ref 3.5–5.2)
Alkaline Phosphatase: 80 U/L (ref 39–117)
BUN: 15 mg/dL (ref 6–23)
CO2: 28 meq/L (ref 19–32)
Calcium: 9.4 mg/dL (ref 8.4–10.5)
Chloride: 96 meq/L (ref 96–112)
Creatinine, Ser: 0.6 mg/dL (ref 0.40–1.20)
GFR: 87.64 mL/min (ref >60.00)
Glucose, Bld: 83 mg/dL (ref 70–99)
Potassium: 4.5 meq/L (ref 3.5–5.1)
Sodium: 130 meq/L — ABNORMAL LOW (ref 135–145)
Total Bilirubin: 0.4 mg/dL (ref 0.2–1.2)
Total Protein: 6.3 g/dL (ref 6.0–8.3)

## 2023-06-20 LAB — VITAMIN B12: Vitamin B-12: 752 pg/mL (ref 211–911)

## 2023-06-23 LAB — HEMOGLOBIN A1C: Hgb A1c MFr Bld: 5.7 % (ref 4.6–6.5)

## 2023-07-04 ENCOUNTER — Other Ambulatory Visit: Payer: Self-pay | Admitting: Family Medicine

## 2023-07-06 ENCOUNTER — Encounter: Payer: Self-pay | Admitting: Family Medicine

## 2023-07-06 DIAGNOSIS — R059 Cough, unspecified: Secondary | ICD-10-CM | POA: Insufficient documentation

## 2023-07-06 DIAGNOSIS — R7309 Other abnormal glucose: Secondary | ICD-10-CM | POA: Insufficient documentation

## 2023-07-06 DIAGNOSIS — E538 Deficiency of other specified B group vitamins: Secondary | ICD-10-CM | POA: Insufficient documentation

## 2023-07-06 NOTE — Assessment & Plan Note (Signed)
Follows with [facility] pain management Not taking Tizanidine Currently taking Percocet 5-325 mg q8h

## 2023-07-06 NOTE — Assessment & Plan Note (Signed)
Check Vitamin D level 

## 2023-07-06 NOTE — Assessment & Plan Note (Addendum)
Follows with Endocrinology at [facility] Bisphosphonate's and unable to tolerate due to GI side effects Takes Forteo 20 mcg injection daily

## 2023-07-06 NOTE — Assessment & Plan Note (Signed)
Chronic Tolerating Trazodone 100 mg at night, managed by pain management Recommend CBT

## 2023-07-06 NOTE — Assessment & Plan Note (Signed)
Post viral syndrome Refill Tessalon pearls x 20 tabs

## 2023-07-06 NOTE — Assessment & Plan Note (Addendum)
Check Vitamin B 12 level 

## 2023-07-08 ENCOUNTER — Other Ambulatory Visit: Payer: Self-pay | Admitting: *Deleted

## 2023-07-08 NOTE — Telephone Encounter (Signed)
Called and informed pt of this information. 

## 2023-09-08 ENCOUNTER — Other Ambulatory Visit: Payer: Self-pay | Admitting: Family Medicine

## 2023-09-08 DIAGNOSIS — R059 Cough, unspecified: Secondary | ICD-10-CM

## 2023-09-26 ENCOUNTER — Other Ambulatory Visit: Payer: Self-pay | Admitting: Gastroenterology

## 2023-09-26 ENCOUNTER — Telehealth: Payer: Self-pay | Admitting: Gastroenterology

## 2023-09-26 DIAGNOSIS — K219 Gastro-esophageal reflux disease without esophagitis: Secondary | ICD-10-CM

## 2023-09-26 NOTE — Telephone Encounter (Signed)
Patient called in requesting a medication refill. I inform her since it's been a year she need to schedule a office visit for her refill. I explain to her that she will get enough medicine to last her until her appointment. The patient stated that she don't want an appointment because she is not sick she just need a refill. I inform her that the providers like to due a quick follow up. She refuse and want to speak to the nurse. She never gave me the name of the medication.

## 2023-09-26 NOTE — Telephone Encounter (Signed)
Called patient back to let her know that her medication will be refilled but to remember to call us back to schedule an appointment before her next refill since she does not want to do it now. Patient understood and had further questions.

## 2023-10-13 ENCOUNTER — Telehealth: Payer: Self-pay | Admitting: Family Medicine

## 2023-10-13 NOTE — Telephone Encounter (Signed)
Copied from CRM [id number]. Topic: Medicare AWV >> Oct 13, 2023 10:32 AM Payton Doughty wrote: Reason for CRM: Called 10/13/2023 to sched Annual Wellness Visit - NO VOICEMAIL (both#)  Verlee Rossetti; Care Guide Ambulatory Clinical Support [facility] Direct Dial: [phone]

## 2023-12-27 ENCOUNTER — Other Ambulatory Visit: Payer: Self-pay | Admitting: Gastroenterology

## 2023-12-27 DIAGNOSIS — K219 Gastro-esophageal reflux disease without esophagitis: Secondary | ICD-10-CM

## 2024-01-05 ENCOUNTER — Other Ambulatory Visit: Payer: Self-pay | Admitting: Gastroenterology

## 2024-01-05 DIAGNOSIS — K219 Gastro-esophageal reflux disease without esophagitis: Secondary | ICD-10-CM

## 2024-03-16 ENCOUNTER — Ambulatory Visit (INDEPENDENT_AMBULATORY_CARE_PROVIDER_SITE_OTHER): Admitting: *Deleted

## 2024-03-16 VITALS — Ht 67.0 in | Wt 88.0 lb

## 2024-03-16 DIAGNOSIS — Z0000 Encounter for general adult medical examination without abnormal findings: Secondary | ICD-10-CM

## 2024-03-16 NOTE — Progress Notes (Signed)
 Subjective:   Nicole Butler is a 78 y.o. who presents for a Medicare Wellness preventive visit.  As a reminder, Annual Wellness Visits don't include a physical exam, and some assessments may be limited, especially if this visit is performed virtually. We may recommend an in-person visit if needed.  Visit Complete: Virtual I connected with  RIYAH TEIG on 03/16/24 by a audio enabled telemedicine application and verified that I am speaking with the correct person using two identifiers.  Patient Location: Home  Provider Location: Home Office  I discussed the limitations of evaluation and management by telemedicine. The patient expressed understanding and agreed to proceed.  Vital Signs: Because this visit was a virtual/telehealth visit, some criteria may be missing or patient reported. Any vitals not documented were not able to be obtained and vitals that have been documented are patient reported.  VideoDeclined- This patient declined Librarian, academic. Therefore the visit was completed with audio only.  Persons Participating in Visit: Patient.  AWV Questionnaire: No: Patient Medicare AWV questionnaire was not completed prior to this visit.  Cardiac Risk Factors include: advanced age (>9men, >55 women)     Objective:     Today's Vitals   03/16/24 1538  Weight: 88 lb (39.9 kg)  Height: 5\' 7"  (1.702 m)   Body mass index is 13.78 kg/m.     03/16/2024    3:51 PM 02/21/2022   10:38 AM 06/21/2021   10:27 AM 05/23/2021    9:25 AM 02/15/2021   12:43 PM 02/15/2020   12:35 PM 08/05/2019    8:27 AM  Advanced Directives  Does Patient Have a Medical Advance Directive? Yes Yes Yes No Yes Yes Yes  Type of Estate agent of [place];Living will Healthcare Power of [place];Living will   Healthcare Power of [place];Living will Healthcare Power of [place];Living will Healthcare Power of [place];Living will  Does patient want to make  changes to medical advance directive?  No - Patient declined   No - Patient declined No - Patient declined   Copy of Healthcare Power of Attorney in Chart? No - copy requested No - copy requested   No - copy requested No - copy requested   Would patient like information on creating a medical advance directive?    No - Patient declined       Current Medications (verified) Outpatient Encounter Medications as of 03/16/2024  Medication Sig   diclofenac sodium (VOLTAREN) 1 % GEL Apply topically 4 (four) times daily.   Lidocaine  HCl 4 % SOLN Apply topically.   methocarbamol (ROBAXIN) 500 MG tablet Take 1 tablet by mouth 3 (three) times daily as needed.   ondansetron  (ZOFRAN ) 4 MG tablet TAKE 1 TABLET BY MOUTH ONCE DAILY AS NEEDED NAUSEA AND VOMITING   oxyCODONE -acetaminophen  (PERCOCET/ROXICET) 5-325 MG tablet Take by mouth every 8 (eight) hours as needed.    polyethylene glycol (MIRALAX  / GLYCOLAX ) packet Take by mouth.   Teriparatide, Recombinant, (FORTEO) 600 MCG/2.4ML SOPN Inject into the skin.   traZODone  (DESYREL ) 50 MG tablet Take 2 tablets (100 mg total) by mouth at bedtime.   benzonatate  (TESSALON ) 100 MG capsule TAKE 1 CAPSULE BY MOUTH TIS AS NEEDED FOR COUGH (Patient not taking: Reported on 03/16/2024)   omeprazole  (PRILOSEC) 40 MG capsule TAKE 1 CAPSULE BY MOUTH TWICE DAILY WITHFOOD (Patient not taking: Reported on 03/16/2024)   No facility-administered encounter medications on file as of 03/16/2024.    Allergies (verified) Butrans [buprenorphine] and Fosamax [alendronate sodium]  History: Past Medical History:  Diagnosis Date   Anemia    H/O    Arthritis    Broken femur (HCC)    left   Chondrodermatitis nodularis helicis    right [facility]   Complication of anesthesia    PT STATES SHE FORGETS TO BREATHE WHEN COMING OUT OF ANESTHESIA   GERD (gastroesophageal reflux disease)    Hip pain    left   Insomnia    Osteopenia    Osteoporosis    Thyroid  disease    Past Surgical  History:  Procedure Laterality Date   ANKLE SURGERY Left    ESOPHAGOGASTRODUODENOSCOPY (EGD) WITH PROPOFOL  N/A 11/12/2018   Procedure: ESOPHAGOGASTRODUODENOSCOPY (EGD) WITH PROPOFOL ;  Surgeon: Luke Salaam, MD;  Location: [facility] ENDOSCOPY;  Service: Gastroenterology;  Laterality: N/A;   ESOPHAGOGASTRODUODENOSCOPY (EGD) WITH PROPOFOL  N/A 12/17/2018   Procedure: ESOPHAGOGASTRODUODENOSCOPY (EGD) WITH PROPOFOL  with Dilation;  Surgeon: Luke Salaam, MD;  Location: [facility] ENDOSCOPY;  Service: Gastroenterology;  Laterality: N/A;   ESOPHAGOGASTRODUODENOSCOPY (EGD) WITH PROPOFOL  N/A 07/15/2019   Procedure: ESOPHAGOGASTRODUODENOSCOPY (EGD) WITH PROPOFOL  with Dilation;  Surgeon: Luke Salaam, MD;  Location: [facility] ENDOSCOPY;  Service: Gastroenterology;  Laterality: N/A;  Per Dr. Antony Baumgartner, include steroid injection post-dilation   ESOPHAGOGASTRODUODENOSCOPY (EGD) WITH PROPOFOL  N/A 08/05/2019   Procedure: ESOPHAGOGASTRODUODENOSCOPY (EGD) WITH PROPOFOL  with Dilation;  Surgeon: Luke Salaam, MD;  Location[facility] ENDOSCOPY;  Service: Gastroenterology;  Laterality: N/A;  Per Dr. Antony Baumgartner, keep Kenalog  10mg /ml for possible injection   ESOPHAGOGASTRODUODENOSCOPY (EGD) WITH PROPOFOL  N/A 08/27/2019   Procedure: ESOPHAGOGASTRODUODENOSCOPY (EGD) WITH PROPOFOL  with Dilation;  Surgeon: Luke Salaam, MD;  Location: [facility] ENDOSCOPY;  Service: Gastroenterology;  Laterality: N/A;   ESOPHAGOGASTRODUODENOSCOPY (EGD) WITH PROPOFOL  N/A 06/21/2021   Procedure: ESOPHAGOGASTRODUODENOSCOPY (EGD) WITH PROPOFOL ;  Surgeon: Luke Salaam, MD;  Location: [facility] ENDOSCOPY;  Service: Gastroenterology;  Laterality: N/A;   FOOT SURGERY Left    x3   FRACTURE SURGERY     HARDWARE REMOVAL Left 12/21/2015   Procedure: HARDWARE REMOVAL;  Surgeon: Rande Bushy, MD;  Location: [facility] ORS;  Service: Orthopedics;  Laterality: Left;  Left HIP    HIP SURGERY Left    x2   KNEE SURGERY Right    x2   LEG SURGERY Left    x2   LUMBAR SPINE SURGERY     repair of compression  fractures x 2 06/2021   TOTAL HIP ARTHROPLASTY Right    x4   Family History  Problem Relation Age of Onset   Alzheimer's disease Mother    Cancer Father        lung never smoker   Alcohol abuse Brother    Dementia Brother    Social History   Socioeconomic History   Marital status: Significant Other    Spouse name: Not on file   Number of children: Not on file   Years of education: Not on file   Highest education level: Associate degree: academic program  Occupational History   Not on file  Tobacco Use   Smoking status: Former    Current packs/day: 0.00    Average packs/day: 1 pack/day for 10.0 years (10.0 ttl pk-yrs)    Types: Cigarettes    Start date: 12/14/1985    Quit date: 12/15/1995    Years since quitting: 28.2   Smokeless tobacco: Never  Vaping Use   Vaping status: Never Used  Substance and Sexual Activity   Alcohol use: Yes    Alcohol/week: 14.0 standard drinks of alcohol    Types: 14 Glasses of wine per  week    Comment: 2 GLASSES EVERY EVENING   Drug use: No   Sexual activity: Not on file  Other Topics Concern   Not on file  Social History Narrative   Lives with Aldine Humphreys    1 daughter [place], 1 son in [place]. Enjoyed training horses raised show horses, prev enjoyed water skiing/diving until her 8s when developed ortho issues   Social Drivers of Corporate investment banker Strain: Low Risk  (03/16/2024)   Overall Financial Resource Strain (CARDIA)    Difficulty of Paying Living Expenses: Not hard at all  Food Insecurity: No Food Insecurity (03/16/2024)   Hunger Vital Sign    Worried About Running Out of Food in the Last Year: Never true    Ran Out of Food in the Last Year: Never true  Transportation Needs: No Transportation Needs (03/16/2024)   PRAPARE - Administrator, Civil Service (Medical): No    Lack of Transportation (Non-Medical): No  Physical Activity: Inactive (03/16/2024)   Exercise Vital Sign    Days of Exercise  per Week: 0 days    Minutes of Exercise per Session: 0 min  Stress: No Stress Concern Present (03/16/2024)   [employer] of Occupational Health - Occupational Stress Questionnaire    Feeling of Stress : Not at all  Social Connections: Moderately Isolated (03/16/2024)   Social Connection and Isolation Panel [NHANES]    Frequency of Communication with Friends and Family: More than three times a week    Frequency of Social Gatherings with Friends and Family: More than three times a week    Attends Religious Services: Never    Database administrator or Organizations: No    Attends Engineer, structural: Never    Marital Status: Living with partner    Tobacco Counseling Counseling given: Not Answered    Clinical Intake:  Pre-visit preparation completed: Yes  Pain : No/denies pain     BMI - recorded: 13.78 Nutritional Status: BMI <19  Underweight Nutritional Risks: None Diabetes: No  Lab Results  Component Value Date   HGBA1C 5.7 06/19/2023     How often do you need to have someone help you when you read instructions, pamphlets, or other written materials from your doctor or pharmacy?: 1 - Never  Interpreter Needed?: No  Information entered by :: R. Clora Ohmer LPN   Activities of Daily Living     03/16/2024    3:40 PM  In your present state of health, do you have any difficulty performing the following activities:  Hearing? 0  Vision? 0  Comment glasses  Difficulty concentrating or making decisions? 0  Walking or climbing stairs? 1  Dressing or bathing? 0  Doing errands, shopping? 1  Preparing Food and eating ? N  Using the Toilet? N  In the past six months, have you accidently leaked urine? N  Do you have problems with loss of bowel control? N  Managing your Medications? N  Managing your Finances? N  Housekeeping or managing your Housekeeping? Y    Patient Care Team: Valli Gaw, MD as PCP - General (Family Medicine) Cindee Crazier, NP (Inactive)  as PCP - Family Medicine (Nurse Practitioner) Jama Mayotte, MD (Student) Rande Bushy, MD as Referring Physician (Orthopedic Surgery) Dustin Gimenez, MD as Consulting Physician (Urology)  Indicate any recent Medical Services you may have received from other than [facility] providers in the past year (date may be approximate).  Assessment:    This is a routine wellness examination for Hau.  Hearing/Vision screen Hearing Screening - Comments:: No issues Vision Screening - Comments:: glasses   Goals Addressed             This Visit's Progress    Patient Stated       Wants to use her walker more       Depression Screen     03/16/2024    3:46 PM 06/19/2023    2:07 PM 02/21/2022   10:36 AM 02/15/2021   12:45 PM 02/15/2020   12:36 PM 12/15/2019    1:41 PM 09/22/2018   10:39 AM  PHQ 2/9 Scores  PHQ - 2 Score 0 1 0 0 0 0 3  PHQ- 9 Score 3 4     12     Fall Risk     03/16/2024    3:43 PM 06/19/2023    2:06 PM 02/21/2022   10:39 AM 08/30/2021   11:35 AM 02/15/2021   12:45 PM  Fall Risk   Falls in the past year? 0 0 0 0 0  Number falls in past yr: 0 0  0 0  Injury with Fall? 0 0  0 0  Risk for fall due to : No Fall Risks No Fall Risks  Impaired mobility Impaired mobility  Follow up Falls prevention discussed;Falls evaluation completed Falls evaluation completed Falls evaluation completed Falls evaluation completed Falls evaluation completed    MEDICARE RISK AT HOME:  Medicare Risk at Home Any stairs in or around the home?: No If so, are there any without handrails?: No Home free of loose throw rugs in walkways, pet beds, electrical cords, etc?: Yes Adequate lighting in your home to reduce risk of falls?: Yes Life alert?: No Use of a cane, walker or w/c?: Yes Grab bars in the bathroom?: Yes Shower chair or bench in shower?: Yes Elevated toilet seat or a handicapped toilet?: Yes  TIMED UP AND GO:  Was the test performed?  No  Cognitive Function: 6CIT  completed    02/15/2020   12:50 PM  MMSE - Mini Mental State Exam  Not completed: Unable to complete        03/16/2024    3:52 PM 06/17/2018   11:14 AM  6CIT Screen  What Year? 0 points 0 points  What month? 0 points 0 points  What time? 0 points 0 points  Count back from 20 0 points 0 points  Months in reverse 0 points 0 points  Repeat phrase 0 points 0 points  Total Score 0 points 0 points    Immunizations Immunization History  Administered Date(s) Administered   Influenza, High Dose Seasonal PF 08/27/2017, 08/17/2018, 06/29/2019, 07/06/2021   Influenza-Unspecified 08/17/2015, 06/19/2016   PFIZER(Purple Top)SARS-COV-2 Vaccination 01/04/2020, 01/25/2020   Pneumococcal Conjugate-13 10/03/2014   Pneumococcal Polysaccharide-23 08/17/2015    Screening Tests Health Maintenance  Topic Date Due   DTaP/Tdap/Td (1 - Tdap) Never done   Zoster Vaccines- Shingrix (1 of 2) Never done   Medicare Annual Wellness (AWV)  02/22/2023   INFLUENZA VACCINE  06/04/2024   Pneumonia Vaccine 61+ Years old  Completed   DEXA SCAN  Completed   Hepatitis C Screening  Completed   HPV VACCINES  Aged Out   Meningococcal B Vaccine  Aged Out   Colonoscopy  Discontinued   COVID-19 Vaccine  Discontinued    Health Maintenance  Health Maintenance Due  Topic Date Due   DTaP/Tdap/Td (1 - Tdap) Never done  Zoster Vaccines- Shingrix (1 of 2) Never done   Medicare Annual Wellness (AWV)  02/22/2023   Health Maintenance Items Addressed: Patient declines vaccines.  Additional Screening:  Vision Screening: Recommended annual ophthalmology exams for early detection of glaucoma and other disorders of the eye.Up to date. Plans on scheduling an appointment with a new eye doctor soon Dr. Wayna Hails  Dental Screening: Recommended annual dental exams for proper oral hygiene  Community Resource Referral / Chronic Care Management: CRR required this visit?  No   CCM required this visit?  No   Plan:     I have personally reviewed and noted the following in the patient's chart:   Medical and social history Use of alcohol, tobacco or illicit drugs  Current medications and supplements including opioid prescriptions. Patient is currently taking opioid prescriptions. Information provided to patient regarding non-opioid alternatives. Patient advised to discuss non-opioid treatment plan with their provider. Functional ability and status Nutritional status Physical activity Advanced directives List of other physicians Hospitalizations, surgeries, and ER visits in previous 12 months Vitals Screenings to include cognitive, depression, and falls Referrals and appointments  In addition, I have reviewed and discussed with patient certain preventive protocols, quality metrics, and best practice recommendations. A written personalized care plan for preventive services as well as general preventive health recommendations were provided to patient.   Felicitas Horse, LPN   [id number]   After Visit Summary: (MyChart) Due to this being a telephonic visit, the after visit summary with patients personalized plan was offered to patient via MyChart   Notes: Nothing significant to report at this time.

## 2024-03-16 NOTE — Patient Instructions (Signed)
 Nicole Butler , Thank you for taking time out of your busy schedule to complete your Annual Wellness Visit with me. I enjoyed our conversation and look forward to speaking with you again next year. I, as well as your care team,  appreciate your ongoing commitment to your health goals. Please review the following plan we discussed and let me know if I can assist you in the future. Your Game plan/ To Do List    Referrals: If you haven't heard from the office you've been referred to, please reach out to them at the phone provided.  Consider updating vaccines. Follow up Visits: Next Medicare AWV with our clinical staff: 03/22/25 @ 10:50   Have you seen your provider in the last 6 months (3 months if uncontrolled diabetes)? No Next Office Visit with your provider: 02/03/25 Transfer of Care.   Clinician Recommendations:  Aim for 30 minutes of exercise or brisk walking, 6-8 glasses of water, and 5 servings of fruits and vegetables each day.       This is a list of the screening recommended for you and due dates:  Health Maintenance  Topic Date Due   DTaP/Tdap/Td vaccine (1 - Tdap) Never done   Zoster (Shingles) Vaccine (1 of 2) Never done   Flu Shot  06/04/2024   Medicare Annual Wellness Visit  03/16/2025   Pneumonia Vaccine  Completed   DEXA scan (bone density measurement)  Completed   Hepatitis C Screening  Completed   HPV Vaccine  Aged Out   Meningitis B Vaccine  Aged Out   Colon Cancer Screening  Discontinued   COVID-19 Vaccine  Discontinued    Advanced directives: (Copy Requested) Please bring a copy of your health care power of attorney and living will to the office to be added to your chart at your convenience. You can mail to [facility] [address] [place], [place] [address] or email to [email or web address] Advance Care Planning is important because it:  [x]  Makes sure you receive the medical care that is consistent with your values, goals, and  preferences  [x]  It provides guidance to your family and loved ones and reduces their decisional burden about whether or not they are making the right decisions based on your wishes.   Managing Pain Without Opioids Opioids are strong medicines used to treat moderate to severe pain. For some people, especially those who have long-term (chronic) pain, opioids may not be the best choice for pain management due to: Side effects like nausea, constipation, and sleepiness. The risk of addiction (opioid use disorder). The longer you take opioids, the greater your risk of addiction. Pain that lasts for more than 3 months is called chronic pain. Managing chronic pain usually requires more than one approach and is often provided by a team of health care providers working together (multidisciplinary approach). Pain management may be done at a pain management center or pain clinic. How to manage pain without the use of opioids Use non-opioid medicines Non-opioid medicines for pain may include: Over-the-counter or prescription non-steroidal anti-inflammatory drugs (NSAIDs). These may be the first medicines used for pain. They work well for muscle and bone pain, and they reduce swelling. Acetaminophen . This over-the-counter medicine may work well for milder pain but not swelling. Antidepressants. These may be used to treat chronic pain. A certain type of antidepressant (tricyclics) is often used. These medicines are given in lower doses for pain than when used for depression. Anticonvulsants. These are usually used to  treat seizures but may also reduce nerve (neuropathic) pain. Muscle relaxants. These relieve pain caused by sudden muscle tightening (spasms). You may also use a pain medicine that is applied to the skin as a patch, cream, or gel (topical analgesic), such as a numbing medicine. These may cause fewer side effects than medicines taken by mouth. Do certain therapies as directed Some therapies can  help with pain management. They include: Physical therapy. You will do exercises to gain strength and flexibility. A physical therapist may teach you exercises to move and stretch parts of your body that are weak, stiff, or painful. You can learn these exercises at physical therapy visits and practice them at home. Physical therapy may also involve: Massage. Heat wraps or applying heat or cold to affected areas. Electrical signals that interrupt pain signals (transcutaneous electrical nerve stimulation, TENS). Weak lasers that reduce pain and swelling (low-level laser therapy). Signals from your body that help you learn to regulate pain (biofeedback). Occupational therapy. This helps you to learn ways to function at home and work with less pain. Recreational therapy. This involves trying new activities or hobbies, such as a physical activity or drawing. Mental health therapy, including: Cognitive behavioral therapy (CBT). This helps you learn coping skills for dealing with pain. Acceptance and commitment therapy (ACT) to change the way you think and react to pain. Relaxation therapies, including muscle relaxation exercises and mindfulness-based stress reduction. Pain management counseling. This may be individual, family, or group counseling.  Receive medical treatments Medical treatments for pain management include: Nerve block injections. These may include a pain blocker and anti-inflammatory medicines. You may have injections: Near the spine to relieve chronic back or neck pain. Into joints to relieve back or joint pain. Into nerve areas that supply a painful area to relieve body pain. Into muscles (trigger point injections) to relieve some painful muscle conditions. A medical device placed near your spine to help block pain signals and relieve nerve pain or chronic back pain (spinal cord stimulation device). Acupuncture. Follow these instructions at home Medicines Take over-the-counter  and prescription medicines only as told by your health care provider. If you are taking pain medicine, ask your health care providers about possible side effects to watch out for. Do not drive or use heavy machinery while taking prescription opioid pain medicine. Lifestyle  Do not use drugs or alcohol to reduce pain. If you drink alcohol, limit how much you have to: 0-1 drink a day for women who are not pregnant. 0-2 drinks a day for men. Know how much alcohol is in a drink. In the [place] one drink equals one 12 oz bottle of beer (355 mL), one 5 oz glass of wine (148 mL), or one 1 oz glass of hard liquor (44 mL). Do not use any products that contain nicotine or tobacco. These products include cigarettes, chewing tobacco, and vaping devices, such as e-cigarettes. If you need help quitting, ask your health care provider. Eat a healthy diet and maintain a healthy weight. Poor diet and excess weight may make pain worse. Eat foods that are high in fiber. These include fresh fruits and vegetables, whole grains, and beans. Limit foods that are high in fat and processed sugars, such as fried and sweet foods. Exercise regularly. Exercise lowers stress and may help relieve pain. Ask your health care provider what activities and exercises are safe for you. If your health care provider approves, join an exercise class that combines movement and stress reduction. Examples include yoga  and tai chi. Get enough sleep. Lack of sleep may make pain worse. Lower stress as much as possible. Practice stress reduction techniques as told by your therapist. General instructions Work with all your pain management providers to find the treatments that work best for you. You are an important member of your pain management team. There are many things you can do to reduce pain on your own. Consider joining an online or in-person support group for people who have chronic pain. Keep all follow-up visits. This is  important. Where to find more information You can find more information about managing pain without opioids from: American Academy of Pain Medicine: painmed.org Institute for Chronic Pain: instituteforchronicpain.org American Chronic Pain Association: theacpa.org Contact a health care provider if: You have side effects from pain medicine. Your pain gets worse or does not get better with treatments or home therapy. You are struggling with anxiety or depression. Summary Many types of pain can be managed without opioids. Chronic pain may respond better to pain management without opioids. Pain is best managed when you and a team of health care providers work together. Pain management without opioids may include non-opioid medicines, medical treatments, physical therapy, mental health therapy, and lifestyle changes. Tell your health care providers if your pain gets worse or is not being managed well enough. This information is not intended to replace advice given to you by your health care provider. Make sure you discuss any questions you have with your health care provider. Document Revised: 01/31/2021 Document Reviewed: 01/31/2021 Elsevier Patient Education  2024 [employer].

## 2024-04-05 NOTE — Progress Notes (Signed)
 Department of Anesthesiology [facility] [address], [address] [place], [place] [address] [phone]  Chronic Pain Follow Up Note 1. Primary osteoarthritis involving multiple joints   2. Insomnia, unspecified type   3. Chronic pain syndrome   4. Compression fracture of L1 vertebra with routine healing, subsequent encounter   5. Primary osteoarthritis of hip, unspecified laterality     Assessment and Plan Nicole Butler is a 78 y.o. being followed at [facility] for complaint of chronic pain localized to bilateral lower back and diffusely throughout her body. She has a history of scoliosis, compression fractures, osteoarthritis, and insomnia. She has been a patient in our clinic for a number of years and initially seen by Dr. Marvelyn, then transitioned to Dr. Bari with his retirement.  Her function has decreased over the years and she now uses a wheelchair for mobility assistance.  She is stable on a multimodal regimen that includes percocet and non-opioids.  June 2025 She was last seen in February.  She returns today for regular follow up.    Chronic pain due to scoliosis and compression fractures Chronic back pain secondary to scoliosis and compression fractures, persistent and severe, affecting daily activities and sleep. Pain is constant and exacerbated by prolonged sitting and walking. Current pain management includes Percocet, Robaxin, Voltaren gel, trazodone , and lidocaine  patches. No new side effects reported from medications. Urine toxicology screen from February was appropriate. Opioid agreement in place since February 2025. Compliant with medication regimen. Discussed Narcan as a precautionary measure for opioid overdose, Rx sent today. - Continue current pain management regimen including Percocet, Robaxin, Voltaren gel, trazodone , and lidocaine  patches. - Refill Percocet prescription and provide two additional refills. -  Prescribe Narcan for emergency use in case of opioid overdose symptoms. - Ensure pill bottle is brought to next visit.  She is appropriately out today  Sleep disturbance due to pain Sleep disturbance attributed to chronic pain. Reports difficulty falling asleep due to pain and increased daytime napping. Current sleep pattern includes approximately 10 hours of sleep per day (including overnight), including naps. Caffeine intake in the evening may contribute to sleep disturbance, but she is not willing to change caffeine habits. - Discussed potential impact of caffeine on sleep and recommended consuming caffeine before noon.  Osteoarthritis Osteoarthritis contributing to chronic pain. Celebrex was previously prescribed for arthritis but not recently refilled. Discussed the risks of daily use of NSAIDs, including potential kidney function impact and increased risk of heart attack and stroke. She reports occasional use of Celebrex for flare-ups. - Prescribe Celebrex for occasional use during flare-ups, not for daily use.  Compression Fractures  - Stable lower back and BLE pain with medication increase previously (07/2022) - Continue percocet 5/325mg , take 2 in AM, 2 in afternoon and 2 at night; refilled  - Will consider referral to Neurosurgery for SCS appropriateness if patient desires in future   Chronic Pain Syndrome; Primary osteoarthritis of hip, unspecified laterality  - Stable - Continue Robaxin 500 mg TID; refilled - Continue Trazodone  100 mg nightly; refilled  - Continue Voltaren gel; refilled - Continue Lidocaine  oint, refilled  Continuous use of Opioids  - Compliant - Appropriately out of medications today - UDS/treatment agreement up to date  Patient does  appear to be utilizing pain medications appropriately and does  report that the medications do improve patient's quality of life and functionality level.  At today's visit, the patient reports fair analgesia from their current  medication  regimen without significant adverse effects.  PDMP reviewed today. Last urine toxicology: 01/01/24 Last urine toxicology screen was appropriate.  Opioid Treatment Agreement: 01/01/24  UDS Frequency: Yearly Patient brought medications to the clinic visit?  No, appropriately out Medication supply appropriate?  N/A Opioid Risk Tool:low risk. 0 (points) on 06/01/21 Pain Psychology: N/A Naloxone ordered: 04/2024 Total morphine  equivalents: 45 Goals of opioids: improved functionality Reason for >90mg  MME: N/A Benzodiazepine: No. COMM score: 8 on 01/01/24, 2 on 04/06/24  Return for Before 9/1-MD (chidgey), NP, or CPP.   Requested Prescriptions   Signed Prescriptions Disp Refills  . naloxone (NARCAN) 4 mg nasal spray 1 each 0    Sig: One spray in either nostril once for known/suspected opioid overdose. May repeat every 2-3 minutes in alternating nostril til EMS arrives  . celecoxib (CELEBREX) 100 MG capsule 30 capsule 0    Sig: Take 1 capsule (100 mg total) by mouth two (2) times a day as needed for pain.  . traZODone  (DESYREL ) 50 MG tablet 180 tablet 0    Sig: Take 1-2 tablets (50-100 mg total) by mouth nightly.  . oxyCODONE -acetaminophen  (PERCOCET) 5-325 mg per tablet 180 tablet 0    Sig: Take 1-2 tablets by mouth Three (3) times a day as needed for pain (at least 4 hours apart). Max-6 tablets per day.  Fill on or after: 04/06/24  . methocarbamol (ROBAXIN) 500 MG tablet 270 tablet 1    Sig: Take 1 tablet (500 mg total) by mouth Three (3) times a day as needed (pain/spasm).  . lidocaine  (XYLOCAINE ) 5 % ointment 200 g 2    Sig: Apply 2-4 grams(1-2 inch ribbon) to affected areas up to three times a day as needed for pain.  . oxyCODONE -acetaminophen  (PERCOCET) 5-325 mg per tablet 180 tablet 0    Sig: Take 1-2 tablets by mouth Three (3) times a day as needed for pain (at least 4 hours apart). Max-6 tablets per day.  Fill on or after: 06/05/24  . oxyCODONE -acetaminophen  (PERCOCET) 5-325  mg per tablet 180 tablet 0    Sig: Take 1-2 tablets by mouth Three (3) times a day as needed for pain (at least 4 hours apart). Max-6 tablets per day.  Fill on or after: 05/06/24   No orders of the defined types were placed in this encounter.    Risks and benefits of above medications including but not limited to possibility of respiratory depression, sedation, and even death were discussed with the patient who expressed an understanding.   Patient was advised to present to the next visit with medication bottles available for a pill/patch count.  HPI DEEM MARMOL is a 78 y.o. being followed at [facility] for complaint of chronic pain localized to bilateral lower back.    The patient was last seen in February, at which point the patient presented with her husband and reported having ongoing pain in her wrists, hips, and back. She denied having any falls. She saw Orthopedics regarding her hip and ankle pain, but they did not have any interventions that could be helpful. She reported that her medications help manage her pain. She took Percocet 5/325 mg 2/2/2 and Robaxin 500 mg TID with benefit.   LILLE KARIM is a 78 year old female with scoliosis, compression fractures, and osteoarthritis who presents with chronic back pain.  She experiences chronic back pain that is constant and worsens with prolonged sitting and walking. The pain significantly interferes with her ability to fall  asleep, although she has been sleeping more than usual lately. She takes Percocet 5 mg, two tablets three times a day, Robaxin, Voltaren gel, trazodone , and lidocaine  patches for pain management, but reports no significant relief from non-medication interventions.  Her sleep pattern includes going to bed around 10 PM and waking up at 7 AM, with an additional nap from 1 PM to 3 PM, totaling about 10 hours of sleep per day. She notes an increase in daytime napping over the years. She consumes caffeine, with  her last intake around 6 PM.  She has a history of osteoarthritis and has used Celebrex in the past, although she has been out of it recently. She does not use over-the-counter NSAIDs like ibuprofen or naproxen. She reports no new allergies to medications and no significant side effects from her current medications, aside from possible sleepiness.  Medications allow me to: sleep better  Patients current medication regimen: - Percocet 5/325mg  2 in the AM, 2 in the afternoon, 2 in the PM - Robaxin 500 TID - Voltaren gel - Trazodone  100 mg nightly  - Lidocaine  patches    Current view: Showing all answers       Welcome Presumptive Financial Assistance Screening     Question 04/06/2024 11:41 AM EDT - Fredricka by Patient   Are you currently experiencing homelessness or living in a shelter? No      [facility] Return Patient Questionnaire     Question 04/06/2024 11:48 AM EDT - Fredricka by Patient   What is the reason for your visit? Medication Refill   Date of onset of your pain:     Please rate your pain at its WORST in the past month. 8   Please rate your pain at its LEAST in the past month. 6   Please rate your pain as it is RIGHT NOW. 7   Please rate your pain on AVERAGE in the past month. 7   Please circle the location of your pain.     Please select the words that describe your pain. Aching    Dull    Pulsing    Sharp    Stabbing Tender Throbbing   How often do you have pain? All the time   When is your pain the worst? Evenings   Which of the following have been negatively affected by your pain? General activity    Normal work    Recreational activities    Sleep    Walking    Standing   Since your last visit:    Have you had any of the following? Death in the Family   Do you have any new pain you would like to discuss with your doctor? No   How has your pain changed? Stayed the same   Are you currently taking any blood-thinners or anticoagulants? No    If you are on Pain Medication - Are you having any of the following? Dry mouth    Drowsiness/Sleepiness    Change in Sleep Patterns    I am stable on my current medication regime    My medications help to improve my quality of live   If you have had a procedure since your last visit, how much pain relief was obtained?     If you have had a procedure since your last visit, were there any complications?     General: Fatigue    Difficulty Sleeping    Daytime Drowsiness   Cardiovascular:  Gastrointestinal - (Intestinal): Nausea    Constipation    Heartburn   Skin: Itching    Dryness   Endocrine (Hormonal System): Thinning hair    Intolerance to heat or cold   Musculoskeletal System - (Muscles, Joints and Coverings): Joint Aches/Swelling    Spasms/Spasticity/Cramps    Back pain    Muscle Aches/Weakness   Neurologic: Excessive Sleeping   Psychiatric: Low Energy      [facility] Patient-Entered Hpi Selection Questionnaire     Question 04/06/2024 11:53 AM EDT - Fredricka by Patient   What is the primary reason for your visit? Back Pain   Your back pain Has lasted for 3 months or more   When did you first notice your back pain? More than 1 year ago   How often do you feel back pain? 2 to 4 times per day   Since you first noticed your back pain, how has it changed? Always present, but gets better and worse   Where is your back pain located? Lower back - below the waist   How would you describe your back pain? Aching    Stabbing   Where does your back pain spread? Right knee   On a scale of 0 to 10 (10 being the worst), how strong is your back pain? 6   Your back pain is... worse during the day   What makes your back pain worse? Bending    Certain positions    Standing   When do you feel stiffness in your back? All day   Are you experiencing any of the following symptoms with your back pain?    Abdominal pain     Bladder incontinence     Bowel incontinence     Chest pain      Fever     Headaches     Leg pain Yes   Numbness     Numbness around the anus     Painful urination     Paralysis     Pelvic pain     Pins and needles     Tingling     Weakness Yes   Weight loss     Do any of the following apply to you? History of osteoporosis    Lack of exercise    Inactive lifestyle        Previous Responses     Op Gct Opt Outs     Question 12/31/2023  7:28 PM EDT - Fredricka by Patient   Would you like to be excluded from the patient list? Yes   Would you like for us  to withhold information about you from your family and friends? No   Would you like for us  to withhold information about you from community clergy? Yes       Allergies Allergies  Allergen Reactions  . Bisphosphonates Other (See Comments) and Anxiety    GI spasms feels like I am swallowing something big  dysphagia  GI spasms feels like I am swallowing something big  Other reaction(s): Other (See Comments)  dysphagia  dysphagia  . Butrans [Buprenorphine] Hives and Rash  . Alendronate Sodium Other (See Comments)    dysphagia  . Fosamax [Alendronate]     Other reaction(s): Other (See Comments) dysphagia    Home Medications   Current Outpatient Medications  Medication Sig Dispense Refill  . diclofenac sodium (VOLTAREN) 1 % gel Apply 4 g topically four (4) times a day. 200 g 11  . FLUAD vaccine (56YR UP)-MF59C(PF)  syringe 2017-18     . omeprazole  (PRILOSEC) 40 MG capsule Take 1 capsule (40 mg total) by mouth daily.    . ondansetron  (ZOFRAN ) 4 MG tablet 1 tablet (4 mg total).    . pen needle, diabetic 32 gauge x 5/32 (4 mm) Ndle Appropriate pen needles for Forteo syringe, use daily 100 each E  . polyethylene glycol (GLYCOLAX ) 17 gram/dose powder     . polyethylene glycol (MIRALAX ) 17 gram packet Take 17 g by mouth daily as needed.    SABRA UNABLE TO FIND Frequency:PHARMDIR   Dosage:0.0     Instructions:  Note:[facility] - Treatment agreement: explained  ,signed and copy given to patient, original sent to medical records. 11/18/2012 [facility] Dose: NONE    . celecoxib (CELEBREX) 100 MG capsule Take 1 capsule (100 mg total) by mouth two (2) times a day as needed for pain. 30 capsule 0  . lidocaine  (XYLOCAINE ) 5 % ointment Apply 2-4 grams(1-2 inch ribbon) to affected areas up to three times a day as needed for pain. 200 g 2  . methocarbamol (ROBAXIN) 500 MG tablet Take 1 tablet (500 mg total) by mouth Three (3) times a day as needed (pain/spasm). 270 tablet 1  . naloxone (NARCAN) 4 mg nasal spray One spray in either nostril once for known/suspected opioid overdose. May repeat every 2-3 minutes in alternating nostril til EMS arrives 1 each 0  . oxyCODONE -acetaminophen  (PERCOCET) 5-325 mg per tablet Take 1-2 tablets by mouth Three (3) times a day as needed for pain (at least 4 hours apart). Max-6 tablets per day.  Fill on or after: 04/06/24 180 tablet 0  . [START ON 06/05/2024] oxyCODONE -acetaminophen  (PERCOCET) 5-325 mg per tablet Take 1-2 tablets by mouth Three (3) times a day as needed for pain (at least 4 hours apart). Max-6 tablets per day.  Fill on or after: 06/05/24 180 tablet 0  . [START ON 05/06/2024] oxyCODONE -acetaminophen  (PERCOCET) 5-325 mg per tablet Take 1-2 tablets by mouth Three (3) times a day as needed for pain (at least 4 hours apart). Max-6 tablets per day.  Fill on or after: 05/06/24 180 tablet 0  . teriparatide (FORTEO) 20 mcg/dose (600mcg/2.4mL) injection Inject 0.08 mL (20 mcg total) under the skin daily. 2.4 mL 11  . traZODone  (DESYREL ) 50 MG tablet Take 1-2 tablets (50-100 mg total) by mouth nightly. 180 tablet 0   No current facility-administered medications for this visit.   Previous Medication Trials: Tizanidine  (not helpful), percocet, hydrocodone , methocarbamol, celerex, topicals, trazodone   Previous Interventions L1-L2 Kyphoplasty 06/2021 - 80% improved Ortho 03/23/24-[facility].  Review Of Systems See above   Physical Exam  VITALS:   Vitals:   04/06/24 1332  BP: 101/65  Pulse: 65  SpO2: 90%     Wt Readings from Last 3 Encounters:  04/06/24 40.4 kg (89 lb)  01/01/24 39.9 kg (88 lb)  10/07/23 39.9 kg (88 lb)    GENERAL: The patient is a thin female, and appears to be in no apparent distress.  HEAD/NECK:   Normocephalic/atraumatic. clear sclera, pupils not pinpoint CV: Warm and well-perfused. LUNGS:  Normal work of breathing, no supplemental O2 EXTREMITIES: No clubbing, cyanosis noted. NEUROLOGIC:   The patient is alert and oriented, speech fluent, normal language.  MUSCULOSKELETAL:   Sitting in wheelchair SKIN:  No obvious rashes, lesions, or erythema. PSY:  Appropriate affect. No overt pain behaviors. No evidence of psychomotor retardation or agitation, no signs of intoxication.     Answers submitted  by the patient for this visit: Back Pain Questionnaire (Submitted on 04/06/2024) Chief Complaint: Back pain Chronicity: chronic Onset: more than 1 year ago Frequency: 2 to 4 times per day Progression since onset: waxing and waning Pain location: sacro-iliac Pain quality: aching, stabbing Radiates to: right knee Pain - numeric: 6/10 Pain is: worse during the day Aggravated by: bending, position, standing Stiffness is present: all day leg pain: Yes weakness: Yes Risk factors: history of osteoporosis, lack of exercise, sedentary lifestyle   Medical Decision Making  Problems Addressed: 2 or more stable chronic illnesses (moderate)  Amount/Complexity: Reviewed external records: Ortho 01/13/24, Ortho 03/23/24-[facility]. and [place] PDMP (today on day of visit) Reviewed results: None Ordered tests: None Assessment requiring independent historian: None Discussion of management/test results with medical professional: None Independent interpretation of test: None  Risk Moderate: Prescription Drug Management, Minor surgery w/ risk factors, Elective Major Surgery w/o risk factors, Diagnosis/treatment  significantly limited by social determinants of health   We are delivering comprehensive, continuous, longitudinal care for this patient with chronic pain.

## 2024-06-14 ENCOUNTER — Emergency Department
Admission: EM | Admit: 2024-06-14 | Discharge: 2024-06-14 | Disposition: A | Discharge status: Home / Self Care | Attending: Emergency Medicine | Admitting: Emergency Medicine

## 2024-06-14 ENCOUNTER — Emergency Department

## 2024-06-14 ENCOUNTER — Other Ambulatory Visit: Payer: Self-pay

## 2024-06-14 DIAGNOSIS — G8929 Other chronic pain: Secondary | ICD-10-CM | POA: Insufficient documentation

## 2024-06-14 DIAGNOSIS — M546 Pain in thoracic spine: Secondary | ICD-10-CM | POA: Diagnosis present

## 2024-06-14 DIAGNOSIS — R109 Unspecified abdominal pain: Secondary | ICD-10-CM | POA: Diagnosis not present

## 2024-06-14 DIAGNOSIS — M545 Low back pain, unspecified: Secondary | ICD-10-CM | POA: Insufficient documentation

## 2024-06-14 LAB — URINALYSIS, ROUTINE W REFLEX MICROSCOPIC
Glucose, UA: NEGATIVE mg/dL
Hgb urine dipstick: NEGATIVE
Ketones, ur: NEGATIVE mg/dL
Nitrite: NEGATIVE
Protein, ur: NEGATIVE mg/dL
Specific Gravity, Urine: 1.027 (ref 1.005–1.030)
pH: 5 (ref 5.0–8.0)

## 2024-06-14 MED ORDER — OXYCODONE HCL 5 MG PO TABS
5.0000 mg | ORAL_TABLET | Freq: Once | ORAL | Status: AC
  Administered 2024-06-14 (×2): 5 mg via ORAL
  Filled 2024-06-14: qty 1

## 2024-06-14 MED ORDER — OXYCODONE HCL 5 MG PO TABS: 5.0000 mg | ORAL_TABLET | ORAL | 0 refills | Status: AC | PRN

## 2024-06-14 NOTE — ED Triage Notes (Signed)
 Pt comes with right sided lower back pain. Pt states hx of compression fractures. Pt denies any recent injuries. Pt states this started week ago.

## 2024-06-14 NOTE — Discharge Instructions (Addendum)
 Your evaluated in the ED for acute on chronic back pain.  Your urinalysis shows some bacteria in your urine and a urine culture has been sent and will be resulted in a couple days.  Since you are not having any urinary symptoms we will not treat with antibiotics at this time but if bacteria is grown on the urine culture you will receive a call and the appropriate antibiotics to take.  Your CT scan of your spine does not show an acute fracture however it does show stable compression fractures from the past.  We would like for you to follow-up with one of our neurosurgery's for further evaluation.  Please call Dr. Claudene office in the morning to schedule an appointment.  Dr. Emelia from [facility] has also been added to your discharge papers as discussed to reach out to the contact in our system.

## 2024-06-14 NOTE — ED Provider Notes (Signed)
 [facility] Emergency Department Provider Note     Event Date/Time   First MD Initiated Contact with Patient 06/14/24 1148     (approximate)   History   Back Pain   HPI  Nicole Butler is a 78 y.o. female with a history of severe scoliosis, significant osteoporosis, and vertebral body fractures s/p kyphoplasty of L1 and L2 performed on 06/25/21 presents to the ED for evaluation of acute on chronic mid to low back pain localized to her right side. She reports no injuries or trauma.  She reports a history of pathological compression fractures.  Denies fever, urinary symptoms.  Patient is taking long term Percocet for ongoing symptoms and follows pain management with [facility].   Chart reviewed -office visit with [facility] pain management 04/06/24 for chronic back pain localized to bilateral lower back and throughout body and referral to neurosurgery for SCS appropriateness.    Physical Exam   Triage Vital Signs: ED Triage Vitals [06/14/24 1039]  Encounter Vitals Group     BP (!) 107/59     Girls Systolic BP Percentile      Girls Diastolic BP Percentile      Boys Systolic BP Percentile      Boys Diastolic BP Percentile      Pulse Rate 68     Resp 18     Temp 98 F (36.7 C)     Temp src      SpO2 100 %     Weight 88 lb (39.9 kg)     Height 5' 6 (1.676 m)     Head Circumference      Peak Flow      Pain Score 9     Pain Loc      Pain Education      Exclude from Growth Chart     Most recent vital signs: Vitals:   06/14/24 1039  BP: (!) 107/59  Pulse: 68  Resp: 18  Temp: 98 F (36.7 C)  SpO2: 100%    General Awake, no distress.  HEENT NCAT.  CV:  Good peripheral perfusion.  RESP:  Normal effort.  ABD:  No distention.  Other:  Obvious scoliosis present.  Point tenderness to right region of paravertebral muscles of the thoracic spine.  No midline tenderness.  Good lower extremity strength.  Patient gets around in motorized scooter at  baseline.   ED Results / Procedures / Treatments   Labs (all labs ordered are listed, but only abnormal results are displayed) Labs Reviewed  URINALYSIS, ROUTINE W REFLEX MICROSCOPIC - Abnormal; Notable for the following components:      Result Value   Color, Urine YELLOW (*)    APPearance CLEAR (*)    Bilirubin Urine SMALL (*)    Leukocytes,Ua MODERATE (*)    Bacteria, UA RARE (*)    All other components within normal limits  URINE CULTURE    RADIOLOGY   I personally viewed and evaluated these images as part of my medical decision making, as well as reviewing the written report by the radiologist.  CT THORACIC SPINE WO CONTRAST Result Date: 06/14/2024 CLINICAL DATA:  Back trauma, no prior imaging (Age >= 16y) Mid-back pain, prior compression fracture; Low back pain, increased fracture risk Right-sided low back pain. History of compression fractures. No recent injury. EXAM: CT THORACIC AND LUMBAR SPINE WITHOUT CONTRAST TECHNIQUE: Multidetector CT imaging of the thoracic and lumbar spine was performed without contrast. Multiplanar CT image reconstructions were also  generated. RADIATION DOSE REDUCTION: This exam was performed according to the departmental dose-optimization program which includes automated exposure control, adjustment of the mA and/or kV according to patient size and/or use of iterative reconstruction technique. COMPARISON:  Lumbar MRI 05/23/2021. No prior thoracic imaging available. FINDINGS: CT THORACIC SPINE FINDINGS Alignment: Straightening with a mild convex right thoracolumbar scoliosis. No focal angulation or listhesis demonstrated within the thoracic spine. Vertebrae: Age indeterminate biconcave compression fractures at T5 and T8, resulting in approximately 65% and 50% loss of vertebral body height, respectively. Mild osseous retropulsion at both levels. Mild superior endplate compression deformity at T10 resulting in approximately 30% loss of vertebral body height. The  posterior elements appear intact. Paraspinal and other soft tissues: No definite acute paraspinal findings. Atherosclerosis of the aorta, great vessels and coronary arteries. Underlying mild centrilobular and paraseptal emphysema with scattered parenchymal scarring or atelectasis. Disc levels: Multilevel spondylosis. In the lower cervical spine, there is disc space narrowing with endplate osteophytes and uncinate spurring at multiple levels. Generally milder spondylosis within the thoracic spine with vacuum phenomenon greatest at T10-11 and T11-12. No large thoracic disc herniation or high-grade spinal stenosis identified. Scattered facet degenerative changes. No high-grade foraminal narrowing demonstrated. CT LUMBAR SPINE FINDINGS Segmentation: There are 5 lumbar type vertebral bodies. Alignment: The alignment is stable with a convex right thoracolumbar scoliosis and a convex left component centered at L4-5. No significant angulation or listhesis. Vertebrae: Patient has undergone interval spinal augmentation at the L1 and L2 levels without significant progressive loss of vertebral body height at either fracture. There is chronic osseous retropulsion at L1 which appears similar. There is a stable chronic superior endplate compression fracture at L4. No acute fractures are identified compared with the previous MRI. Paraspinal and other soft tissues: No acute paraspinal findings. Calcified gallstones, aortic and branch vessel atherosclerosis and distal colonic diverticulosis are noted. Disc levels: Similar multilevel spondylosis with disc space narrowing, vacuum phenomenon, endplate osteophytes and facet hypertrophy. Similar resulting mild multifactorial spinal stenosis at L2-3 and moderate spinal stenosis at L3-4. Chronic foraminal narrowing at the lower 3 levels appears unchanged. IMPRESSION: 1. Age indeterminate biconcave compression fractures at T5 and T8 with mild osseous retropulsion. Correlate with point  tenderness. MRI of the thoracic spine may be helpful to better age the fractures if clinically warranted. 2. Mild superior endplate compression deformity at T10, also age indeterminate. 3. Stable chronic superior endplate compression fracture at L4. 4. Interval spinal augmentation at L1 and L2 without significant progressive loss of vertebral body height. 5. Stable multilevel spondylosis with resulting mild multifactorial spinal stenosis at L2-3 and moderate spinal stenosis at L3-4. 6. Aortic Atherosclerosis (ICD10-I70.0) and Emphysema (ICD10-J43.9). Electronically Signed   By: Elsie Perone M.D.   On: 06/14/2024 15:10   CT Lumbar Spine Wo Contrast Result Date: 06/14/2024 CLINICAL DATA:  Back trauma, no prior imaging (Age >= 16y) Mid-back pain, prior compression fracture; Low back pain, increased fracture risk Right-sided low back pain. History of compression fractures. No recent injury. EXAM: CT THORACIC AND LUMBAR SPINE WITHOUT CONTRAST TECHNIQUE: Multidetector CT imaging of the thoracic and lumbar spine was performed without contrast. Multiplanar CT image reconstructions were also generated. RADIATION DOSE REDUCTION: This exam was performed according to the departmental dose-optimization program which includes automated exposure control, adjustment of the mA and/or kV according to patient size and/or use of iterative reconstruction technique. COMPARISON:  Lumbar MRI 05/23/2021. No prior thoracic imaging available. FINDINGS: CT THORACIC SPINE FINDINGS Alignment: Straightening with a mild convex right thoracolumbar  scoliosis. No focal angulation or listhesis demonstrated within the thoracic spine. Vertebrae: Age indeterminate biconcave compression fractures at T5 and T8, resulting in approximately 65% and 50% loss of vertebral body height, respectively. Mild osseous retropulsion at both levels. Mild superior endplate compression deformity at T10 resulting in approximately 30% loss of vertebral body height. The  posterior elements appear intact. Paraspinal and other soft tissues: No definite acute paraspinal findings. Atherosclerosis of the aorta, great vessels and coronary arteries. Underlying mild centrilobular and paraseptal emphysema with scattered parenchymal scarring or atelectasis. Disc levels: Multilevel spondylosis. In the lower cervical spine, there is disc space narrowing with endplate osteophytes and uncinate spurring at multiple levels. Generally milder spondylosis within the thoracic spine with vacuum phenomenon greatest at T10-11 and T11-12. No large thoracic disc herniation or high-grade spinal stenosis identified. Scattered facet degenerative changes. No high-grade foraminal narrowing demonstrated. CT LUMBAR SPINE FINDINGS Segmentation: There are 5 lumbar type vertebral bodies. Alignment: The alignment is stable with a convex right thoracolumbar scoliosis and a convex left component centered at L4-5. No significant angulation or listhesis. Vertebrae: Patient has undergone interval spinal augmentation at the L1 and L2 levels without significant progressive loss of vertebral body height at either fracture. There is chronic osseous retropulsion at L1 which appears similar. There is a stable chronic superior endplate compression fracture at L4. No acute fractures are identified compared with the previous MRI. Paraspinal and other soft tissues: No acute paraspinal findings. Calcified gallstones, aortic and branch vessel atherosclerosis and distal colonic diverticulosis are noted. Disc levels: Similar multilevel spondylosis with disc space narrowing, vacuum phenomenon, endplate osteophytes and facet hypertrophy. Similar resulting mild multifactorial spinal stenosis at L2-3 and moderate spinal stenosis at L3-4. Chronic foraminal narrowing at the lower 3 levels appears unchanged. IMPRESSION: 1. Age indeterminate biconcave compression fractures at T5 and T8 with mild osseous retropulsion. Correlate with point  tenderness. MRI of the thoracic spine may be helpful to better age the fractures if clinically warranted. 2. Mild superior endplate compression deformity at T10, also age indeterminate. 3. Stable chronic superior endplate compression fracture at L4. 4. Interval spinal augmentation at L1 and L2 without significant progressive loss of vertebral body height. 5. Stable multilevel spondylosis with resulting mild multifactorial spinal stenosis at L2-3 and moderate spinal stenosis at L3-4. 6. Aortic Atherosclerosis (ICD10-I70.0) and Emphysema (ICD10-J43.9). Electronically Signed   By: Elsie Perone M.D.   On: 06/14/2024 15:10    PROCEDURES:  Critical Care performed: No  Procedures  MEDICATIONS ORDERED IN ED: Medications  oxyCODONE  (Oxy IR/ROXICODONE ) immediate release tablet 5 mg (5 mg Oral Given 06/14/24 1246)    IMPRESSION / MDM / ASSESSMENT AND PLAN / ED COURSE  I reviewed the triage vital signs and the nursing notes.                               78 y.o. female presents to the emergency department for evaluation and treatment of acute on chronic nontraumatic back pain. See HPI for further details.   Differential diagnosis includes, but is not limited to fracture, acute on chronic pain syndrome, scoliosis  Patient's presentation is most consistent with acute complicated illness / injury requiring diagnostic workup.  CT of thoracic and lumbar spine show reveal age indeterminate compression fractures at different thoracic levels and stable chronic endplate compression fractures at the lumbar level.  MRI was offered as suggested by radiology read, however patient declined stating she has already had multiple  MRI performed in the past. A  TLSO brace was then offered to the patient.  Patient reports with her past compression fractures there was never offered any sort of back brace and believes this would not improve her pain and she did not want to wait any longer in the emergency department as she  has been here since 10 AM.  When asked if she has followed up with a neurosurgeon, she states there is no one who has wanted to treat her or perform surgery other than Dr Emelia at [facility], but unfortunately she has not been able to contact with Dr. Emelia to schedule a follow-up appointment.  I discussed with patient of urinalysis results and given that she is asymptomatic with moderate leukocytes I stated that we will send a urine culture and if positive for bacteria she will receive a call with the appropriate antibiotics.  I have viewed the charts of Dr. Emelia and added her contact to the patient's discharge paper along with our team's neurosurgery Dr. Claudene for her to follow-up and schedule appointment for further evaluation of ongoing back pain.  Will send in oxycodone  for pain. Review of the [place] CSRS was performed in accordance of the [facility] prior to dispensing any controlled drugs. Patient is agreement with this plan.  At this time she is in stable condition for discharge home.  ED return precaution discussed.  Patient verbalized understanding.     FINAL CLINICAL IMPRESSION(S) / ED DIAGNOSES   Final diagnoses:  Chronic right-sided thoracic back pain   Rx / DC Orders   ED Discharge Orders          Ordered    oxyCODONE  (ROXICODONE ) 5 MG immediate release tablet  Every 4 hours PRN        06/14/24 1535             Note:  This document was prepared using Dragon voice recognition software and may include unintentional dictation errors.    Margrette, Hasana Alcorta A, PA-C 06/14/24 1615    Suzanne Kirsch, MD 06/15/24 1620

## 2024-06-16 LAB — URINE CULTURE

## 2025-02-03 ENCOUNTER — Encounter: Admitting: Family

## 2025-03-22 ENCOUNTER — Ambulatory Visit
